# Patient Record
Sex: Female | Born: 1940 | Race: White | Hispanic: No | Smoking: Former smoker
Health system: Southern US, Community
[De-identification: ages and names within clinical notes are randomized; demographics above are authoritative.]

## PROBLEM LIST (undated history)

## (undated) DIAGNOSIS — E782 Mixed hyperlipidemia: Secondary | ICD-10-CM

## (undated) DIAGNOSIS — Z8679 Personal history of other diseases of the circulatory system: Secondary | ICD-10-CM

## (undated) DIAGNOSIS — F329 Major depressive disorder, single episode, unspecified: Secondary | ICD-10-CM

## (undated) DIAGNOSIS — I272 Pulmonary hypertension, unspecified: Secondary | ICD-10-CM

## (undated) DIAGNOSIS — I2510 Atherosclerotic heart disease of native coronary artery without angina pectoris: Secondary | ICD-10-CM

## (undated) DIAGNOSIS — I5042 Chronic combined systolic (congestive) and diastolic (congestive) heart failure: Secondary | ICD-10-CM

## (undated) DIAGNOSIS — F32A Depression, unspecified: Secondary | ICD-10-CM

## (undated) DIAGNOSIS — G5711 Meralgia paresthetica, right lower limb: Secondary | ICD-10-CM

## (undated) DIAGNOSIS — G4733 Obstructive sleep apnea (adult) (pediatric): Secondary | ICD-10-CM

## (undated) DIAGNOSIS — K219 Gastro-esophageal reflux disease without esophagitis: Secondary | ICD-10-CM

## (undated) DIAGNOSIS — Z853 Personal history of malignant neoplasm of breast: Secondary | ICD-10-CM

## (undated) DIAGNOSIS — I10 Essential (primary) hypertension: Secondary | ICD-10-CM

## (undated) DIAGNOSIS — I447 Left bundle-branch block, unspecified: Secondary | ICD-10-CM

## (undated) DIAGNOSIS — E039 Hypothyroidism, unspecified: Secondary | ICD-10-CM

## (undated) DIAGNOSIS — K760 Fatty (change of) liver, not elsewhere classified: Secondary | ICD-10-CM

## (undated) DIAGNOSIS — M519 Unspecified thoracic, thoracolumbar and lumbosacral intervertebral disc disorder: Secondary | ICD-10-CM

## (undated) DIAGNOSIS — I4891 Unspecified atrial fibrillation: Secondary | ICD-10-CM

## (undated) HISTORY — DX: Major depressive disorder, single episode, unspecified: F32.9

## (undated) HISTORY — DX: Essential (primary) hypertension: I10

## (undated) HISTORY — DX: Depression, unspecified: F32.A

## (undated) HISTORY — DX: Gastro-esophageal reflux disease without esophagitis: K21.9

## (undated) HISTORY — DX: Left bundle-branch block, unspecified: I44.7

## (undated) HISTORY — DX: Morbid (severe) obesity due to excess calories: E66.01

## (undated) HISTORY — DX: Meralgia paresthetica, right lower limb: G57.11

## (undated) HISTORY — PX: TUBAL LIGATION: SHX77

## (undated) HISTORY — DX: Hypothyroidism, unspecified: E03.9

## (undated) HISTORY — DX: Unspecified atrial fibrillation: I48.91

## (undated) HISTORY — DX: Chronic combined systolic (congestive) and diastolic (congestive) heart failure: I50.42

## (undated) HISTORY — DX: Mixed hyperlipidemia: E78.2

## (undated) HISTORY — DX: Unspecified thoracic, thoracolumbar and lumbosacral intervertebral disc disorder: M51.9

## (undated) HISTORY — DX: Obstructive sleep apnea (adult) (pediatric): G47.33

## (undated) HISTORY — DX: Fatty (change of) liver, not elsewhere classified: K76.0

## (undated) HISTORY — DX: Pulmonary hypertension, unspecified: I27.20

## (undated) HISTORY — DX: Personal history of malignant neoplasm of breast: Z85.3

---

## 1987-11-24 HISTORY — PX: MASTECTOMY, RADICAL: SHX710

## 2002-06-30 ENCOUNTER — Ambulatory Visit (HOSPITAL_COMMUNITY): Admission: RE | Admit: 2002-06-30 | Discharge: 2002-06-30 | Payer: Self-pay

## 2002-06-30 ENCOUNTER — Encounter: Payer: Self-pay | Admitting: Family Medicine

## 2003-03-13 ENCOUNTER — Encounter: Payer: Self-pay | Admitting: Radiology

## 2003-03-13 ENCOUNTER — Encounter: Payer: Self-pay | Admitting: Neurosurgery

## 2003-03-13 ENCOUNTER — Encounter: Admission: RE | Admit: 2003-03-13 | Discharge: 2003-03-13 | Payer: Self-pay | Admitting: Neurosurgery

## 2003-03-28 ENCOUNTER — Encounter: Payer: Self-pay | Admitting: Neurosurgery

## 2003-03-28 ENCOUNTER — Encounter: Admission: RE | Admit: 2003-03-28 | Discharge: 2003-03-28 | Payer: Self-pay | Admitting: Neurosurgery

## 2003-04-12 ENCOUNTER — Encounter: Payer: Self-pay | Admitting: Radiology

## 2003-04-12 ENCOUNTER — Encounter: Admission: RE | Admit: 2003-04-12 | Discharge: 2003-04-12 | Payer: Self-pay | Admitting: Neurosurgery

## 2003-04-12 ENCOUNTER — Encounter: Payer: Self-pay | Admitting: Neurosurgery

## 2003-10-10 ENCOUNTER — Ambulatory Visit (HOSPITAL_COMMUNITY): Admission: RE | Admit: 2003-10-10 | Discharge: 2003-10-10 | Payer: Self-pay | Admitting: Neurosurgery

## 2003-12-25 HISTORY — PX: FEMORAL EXPLORATION: SHX1585

## 2004-01-08 ENCOUNTER — Ambulatory Visit (HOSPITAL_COMMUNITY): Admission: RE | Admit: 2004-01-08 | Discharge: 2004-01-09 | Payer: Self-pay | Admitting: Neurosurgery

## 2005-10-23 HISTORY — PX: KNEE ARTHROSCOPY: SUR90

## 2006-07-24 HISTORY — PX: HEEL SPUR SURGERY: SHX665

## 2007-06-30 ENCOUNTER — Ambulatory Visit: Payer: Self-pay | Admitting: Cardiology

## 2008-04-19 ENCOUNTER — Ambulatory Visit (HOSPITAL_COMMUNITY): Admission: RE | Admit: 2008-04-19 | Discharge: 2008-04-19 | Payer: Self-pay | Admitting: Ophthalmology

## 2008-04-19 HISTORY — PX: CATARACT EXTRACTION, BILATERAL: SHX1313

## 2008-05-14 ENCOUNTER — Ambulatory Visit (HOSPITAL_COMMUNITY): Admission: RE | Admit: 2008-05-14 | Discharge: 2008-05-14 | Payer: Self-pay | Admitting: Ophthalmology

## 2008-05-23 HISTORY — PX: PARS PLANA VITRECTOMY W/ REPAIR OF MACULAR HOLE: SHX2170

## 2010-12-30 ENCOUNTER — Other Ambulatory Visit: Payer: Self-pay | Admitting: Neurosurgery

## 2010-12-30 DIAGNOSIS — M5126 Other intervertebral disc displacement, lumbar region: Secondary | ICD-10-CM

## 2011-01-07 ENCOUNTER — Other Ambulatory Visit: Payer: Self-pay

## 2011-01-07 ENCOUNTER — Ambulatory Visit
Admission: RE | Admit: 2011-01-07 | Discharge: 2011-01-07 | Disposition: A | Discharge status: Home / Self Care | Payer: BC Managed Care – PPO | Source: Ambulatory Visit | Attending: Neurosurgery | Admitting: Neurosurgery

## 2011-01-07 DIAGNOSIS — M5126 Other intervertebral disc displacement, lumbar region: Secondary | ICD-10-CM

## 2011-02-20 DIAGNOSIS — R079 Chest pain, unspecified: Secondary | ICD-10-CM

## 2011-03-02 HISTORY — PX: LAPAROSCOPIC CHOLECYSTECTOMY: SUR755

## 2011-04-07 NOTE — Op Note (Signed)
Leslie Alexander, Leslie Alexander             ACCOUNT NO.:  [id number]   MEDICAL RECORD NO.:  [id number]          PATIENT TYPE:  AMB   LOCATION:  SDS                          FACILITY:  [facility]   PHYSICIAN:  Alford Highland. Rankin, M.D.   DATE OF BIRTH:  March 10, 1941   DATE OF PROCEDURE:  05/14/2008  DATE OF DISCHARGE:  05/14/2008                               OPERATIVE REPORT   PREOPERATIVE DIAGNOSES:  1. Macular hole, right eye - stage III.  2. Age-related macular degeneration.   POSTOPERATIVE DIAGNOSES:  1. Macular hole, right eye - stage III.  2. Age-related macular degeneration.   PROCEDURES:  1. Posterior vitrectomy with membrane peel - internal limiting      membrane - 25 gauge, right eye.  2. Injection of vitreous substitute - 20%.   SURGEON:  Edmon Crape, MD   ANESTHESIA:  Local retrobulbar and monitored anesthesia control.   INDICATIONS FOR PROCEDURE:  The patient is a 70 year old woman who has  significant impairment of activities of daily living on the basis of  profound vision loss in the right and on the basis of stage III macular  hole.  She understands this is an attempt we will allow the closure of  the macular hole.  She understands with the closure of the macular hole  with the visual acuity had a chance to improve.  She understands the  risks of anesthesia including the rare occurrence of death, but also to  the eye including the underlying conditions  as well as surgical repair  including but not limited to hemorrhage, infection, scarring, need for  another surgery, no change in vision, loss of vision, and progressive  disease despite intervention.   DESCRIPTION OF PROCEDURE:  Appropriate signed consent was obtained, the  patient was taken to the operating room.  In the operating room,  appropriate monitors followed by mild sedation.  Xylocaine 2% was  injected 5 mL retrobulbar into the right eye with an additional 5 mL in  the fashion of a modified [employer] exteriorly.   The right periocular  region was sterilely prepped and draped in usual ophthalmic fashion.  Lid speculum was applied.  A 25-gauge trocar was placed in the  inferotemporal quadrant.  Superior trocar was applied.  Core vitrectomy  was then begun.  Anterior hyaloid was removed.  Position-induced  posterior vitreous detachment was created nasal to the optic nerve and  this was elevated anterior to the equator at 360 degrees and the  vitreous base was trimmed.  At this time, fluid-air exchange was  completed.  There was a small meniscus of fluid overlying the posterior  pole.  A dilute solution of 1 mL ICG mixed with 5.5 mL of BSS was then  placed over the macular region.  This was immediately aspirated using a  soft tip extrusion needle.  Thereafter, a fluid air exchange was done.  A 25-gauge forceps was then used to remove the internal limiting  membrane and this was carried out 360 degrees around the edge of the  macular hole.  Excellent mobilization was then confirmed.  At this time,  fluid-air exchange was  completed.  SF6 exchange 20% was completed. Superior trocars were  removed. Infusion removed. Subconjunctival Decadron was applied.  Sterile patch and Fox shield were applied.  The patient was taken to the  PACU in good and stable condition.      Alford Highland Rankin, M.D.  Electronically Signed     GAR/MEDQ  D:  05/14/2008  T:  05/15/2008  Job:  [id number]

## 2011-04-10 NOTE — Op Note (Signed)
NAME:  DILYN, OSORIA                       ACCOUNT NO.:  [id number]   MEDICAL RECORD NO.:  [id number]                   PATIENT TYPE:  OIB   LOCATION:  2899                                 FACILITY:  [facility]   PHYSICIAN:  Payton Doughty, M.D.                   DATE OF BIRTH:  13-Aug-1941   DATE OF PROCEDURE:  01/08/2004  DATE OF DISCHARGE:                                 OPERATIVE REPORT   PREOPERATIVE. DIAGNOSIS:  Meralgia paresthetica.   POSTOPERATIVE DIAGNOSIS:  Meralgia paresthetica.   OPERATION PERFORMED:  Decompression of lateral femoral cutaneous nerve.   SURGEON:  Payton Doughty, M.D.   ASSISTANTBasilia Jumbo.   ANESTHESIA:  General endotracheal.   PREP:  Sterile Betadine prep and scrub with Betadine paint.   INDICATIONS FOR PROCEDURE:  The patient is a 70 year old female with  meralgia paresthetica and a positive Tinel sign just posterior to the  anterior superior iliac spine.   DESCRIPTION OF PROCEDURE:  The patient was taken to the operating room,  smoothly anesthetized, intubated, and placed supine on the operating table.  The stomach, pannus taped back.  Following shave, prep and drape in the  usual sterile fashion the skin was incised in a curvilinear fashion starting  about 4 cm posterior to the anterior superior iliac spine, carried down to  the inguinal crease, following the inguinal crease to about the level of the  anterior superior iliac spine and then down onto the thigh approximately 3  cm.  Through this incision, the fascial plane was rapidly attained and the  lateral margin of the sartorius muscle identified.  Immediately under it was  compressed the lateral femoral cutaneous nerve as it traversed over to  pierce the tensor fascial lata.  This corresponded exactly to where Tinel's  was.  There was partial division of the tendon to allow decompression of the  nerve so that a finger could be readily passed underneath it.  The nerve was  also followed laterally  along where Tinel's was and several small adhesions  to the tensor fascia lata were divided.  In the end the nerve was completely  decompressed.  The wound was irrigated and hemostasis assured.  The dead  space was eliminated with 2-0 Vicryl in interrupted fashion.  The  subcutaneous tissue was reapproximated with 2-0 Vicryl in interrupted  fashion.  The skin was closed with 3-0 nylon in a simple running fashion.  Betadine Telfa dressing was applied and made occlusive with OpSite.  The  patient was then transferred to the recovery room in good condition.                                               Payton Doughty, M.D.    MWR/MEDQ  D:  01/08/2004  T:  01/08/2004  Job:  [id number]

## 2011-04-10 NOTE — Op Note (Signed)
NAME:  Leslie Alexander, Leslie Alexander                       ACCOUNT NO.:  [id number]   MEDICAL RECORD NO.:  [id number]                   PATIENT TYPE:  OIB   LOCATION:  2859                                 FACILITY:  [facility]   PHYSICIAN:  Payton Doughty, M.D.                   DATE OF BIRTH:  04/22/1941   DATE OF PROCEDURE:  10/10/2003  DATE OF DISCHARGE:                                 OPERATIVE REPORT   OPERATION PERFORMED:  Injection of the right lateral femoral cutaneous  nerve.   SURGEON:  Payton Doughty, M.D.   ASSISTANTBasilia Jumbo.   ANESTHESIA:  1% lidocaine.   COMPLICATIONS:  None.   INDICATIONS FOR PROCEDURE:  The patient is a 70 year old woman with right  neuralgia paresthetica.   DESCRIPTION OF PROCEDURE:  This is a 70 year old lady woman with right  neuralgia paresthetica.  She was taken to the operating room and had skin  cleaned with Betadine solution.  Palpation along the anterior superior iliac  spine revealed a positive Tinel's at the site approximately 2 cm anterior to  the ASIS along the course of the inguinal ligament.  At the site of this  positive Tinel, a skin wheal raised with 1% lidocaine.  Working through  this, 9mL of 0.5% Marcaine was injected in the area around the lateral  femoral cutaneous nerve. The patient felt immediate relieve with no sensory  dysesthesias noted in the lateral distribution of lateral femoral cutaneous  nerve.  She was returned to the recovery room in good condition.                                               Payton Doughty, M.D.    MWR/MEDQ  D:  10/10/2003  T:  10/10/2003  Job:  [id number]

## 2011-04-10 NOTE — H&P (Signed)
NAME:  Leslie Alexander, Leslie Alexander                       ACCOUNT NO.:  [id number]   MEDICAL RECORD NO.:  [id number]                   PATIENT TYPE:  OIB   LOCATION:  2899                                 FACILITY:  [facility]   PHYSICIAN:  Payton Doughty, M.D.                   DATE OF BIRTH:  03/06/1941   DATE OF ADMISSION:  01/08/2004  DATE OF DISCHARGE:                                HISTORY & PHYSICAL   ADMITTING DIAGNOSIS:  Meralgia paresthetica.   HISTORY OF PRESENT ILLNESS:  This is a 70 year old right-handed white lady  who has pain in the distribution of the right lateral femoral cutaneous  nerve.  It has been present for some time.  She has been on Neurontin 600 mg  t.i.d., and has had it injected.  The injection provided transient relief,  and the meralgia paresthetica has recurred.  She is now here for  decompression of the lateral femoral cutaneous nerve.   MEDICAL HISTORY:  Remarkable for anxiety and depression.   SURGICAL HISTORY:  None.   She has had a stress test recently which was negative for cardiac disease.  She had a breast cancer in 1989 with subsequent chemo without any  recurrence.   ALLERGIES:  CODEINE.   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   MEDICATIONS:  1. Neurontin 600 t.i.d.  2. Ativan 0.5 mg b.i.d.  3. Allegra 180 mg a day.  4. Synthroid 0.01 mg a day.  5. Lexapro 10 mg a day.  6. Protonix 40 mg a day.  7. Over-the-counter medications.  8. Aspirin a day.   PHYSICAL EXAMINATION:  HEENT:  Within normal limits.  NECK:  She has reasonable range of motion of her neck.  CHEST:  Clear.  CARDIAC:  Regular rate and rhythm without a murmur at this time.  ABDOMEN:  Nontender, somewhat large without hepatosplenomegaly.  EXTREMITIES:  Without clubbing or cyanosis.  Peripheral pulses are good.  GU:  Deferred.  NEUROLOGIC:  She is awake, alert and oriented.  Cranial nerves are intact.  Motor exam shows 5/5 strength throughout the upper  and lower extremities.  She has pain in the distribution of the lateral femoral cutaneous nerve, and  slightly hypalgesic in that distribution.  She has a positive Tinel's over  the anterior superior iliac spine.  Reflexes are 2 at the knees, absent at  the ankles.  Straight leg raise is positive for the meralgia paresthetica.   Spine studies have been negative.   CLINICAL IMPRESSION:  Meralgia paresthetica.  Because the block worked, I  think it is reasonable to explore it over the site of the Tinel's to see if  there is a compressive lesion that can be identified.  The risks and  benefits have been discussed with her and she wishes to proceed.  Payton Doughty, M.D.    MWR/MEDQ  D:  01/08/2004  T:  01/08/2004  Job:  [id number]

## 2011-08-19 LAB — BASIC METABOLIC PANEL
BUN: 14
Chloride: 101
GFR calc non Af Amer: >60
Glucose, Bld: 108 — ABNORMAL HIGH
Potassium: 4.5
Sodium: 135

## 2011-08-19 LAB — HEMOGLOBIN AND HEMATOCRIT, BLOOD: Hemoglobin: 12.6

## 2011-08-20 LAB — CBC
HCT: 37.2
Hemoglobin: 13.2
RBC: 3.99
RDW: 13.1

## 2011-08-20 LAB — BASIC METABOLIC PANEL
CO2: 26
Calcium: 9.5
GFR calc Af Amer: >60
GFR calc non Af Amer: >60
Glucose, Bld: 121 — ABNORMAL HIGH
Potassium: 4.2
Sodium: 137

## 2011-12-29 HISTORY — PX: TOTAL KNEE ARTHROPLASTY: SHX125

## 2011-12-30 DIAGNOSIS — I4891 Unspecified atrial fibrillation: Secondary | ICD-10-CM

## 2012-01-26 ENCOUNTER — Other Ambulatory Visit: Payer: Self-pay

## 2012-01-26 HISTORY — PX: INCISION AND DRAINAGE OF WOUND: SHX1803

## 2012-03-09 ENCOUNTER — Other Ambulatory Visit: Payer: Self-pay | Admitting: Orthopedic Surgery

## 2012-03-14 ENCOUNTER — Encounter: Payer: Self-pay | Admitting: *Deleted

## 2012-03-18 DIAGNOSIS — I509 Heart failure, unspecified: Secondary | ICD-10-CM

## 2012-03-18 DIAGNOSIS — R0602 Shortness of breath: Secondary | ICD-10-CM

## 2012-03-31 ENCOUNTER — Encounter: Payer: Self-pay | Admitting: Cardiology

## 2012-04-01 ENCOUNTER — Telehealth: Payer: Self-pay | Admitting: Physician Assistant

## 2012-04-01 ENCOUNTER — Encounter: Payer: Self-pay | Admitting: Physician Assistant

## 2012-04-01 ENCOUNTER — Ambulatory Visit (INDEPENDENT_AMBULATORY_CARE_PROVIDER_SITE_OTHER): Payer: Medicare Other | Admitting: Physician Assistant

## 2012-04-01 ENCOUNTER — Encounter: Payer: Self-pay | Admitting: *Deleted

## 2012-04-01 VITALS — BP 108/70 | HR 74 | Ht 65.0 in | Wt 255.0 lb

## 2012-04-01 DIAGNOSIS — R0602 Shortness of breath: Secondary | ICD-10-CM

## 2012-04-01 DIAGNOSIS — Z79899 Other long term (current) drug therapy: Secondary | ICD-10-CM

## 2012-04-01 DIAGNOSIS — I480 Paroxysmal atrial fibrillation: Secondary | ICD-10-CM | POA: Insufficient documentation

## 2012-04-01 DIAGNOSIS — E782 Mixed hyperlipidemia: Secondary | ICD-10-CM | POA: Insufficient documentation

## 2012-04-01 DIAGNOSIS — I5032 Chronic diastolic (congestive) heart failure: Secondary | ICD-10-CM | POA: Insufficient documentation

## 2012-04-01 DIAGNOSIS — I5030 Unspecified diastolic (congestive) heart failure: Secondary | ICD-10-CM

## 2012-04-01 DIAGNOSIS — I447 Left bundle-branch block, unspecified: Secondary | ICD-10-CM | POA: Insufficient documentation

## 2012-04-01 DIAGNOSIS — I4891 Unspecified atrial fibrillation: Secondary | ICD-10-CM

## 2012-04-01 MED ORDER — FUROSEMIDE 40 MG PO TABS: 40.0000 mg | ORAL_TABLET | Freq: Every day | ORAL | Status: DC

## 2012-04-01 MED ORDER — POTASSIUM CHLORIDE CRYS ER 20 MEQ PO TBCR: 20.0000 meq | EXTENDED_RELEASE_TABLET | Freq: Every day | ORAL | Status: DC

## 2012-04-01 NOTE — Progress Notes (Addendum)
HPI: Patient presents as a recent post hospital followup from [facility], seen in consultation by Dr. Andee Lineman and myself, and new to our practice. She presented with no known history of CAD or CHF, with recent diagnosis of PAF, placed on Xarelto by Dr. Reuel Boom. She had had recent diagnostic studies, both reviewed by Dr. Diona Browner, notable for a 2-D echo in February revealing EF 60-65%, mild MR, and mild PHTN. A Lexiscan Myoview in March was negative for definite ischemia; EF 56%.  Patient presented to the hospital with acute/chronic DHF and AF with RVR. NL serial cardiac markers. We recommended continuing beta blocker/digoxin for rate control, and current IV Lasix diuretic regimen, given that the patient had diuresed over 4 L/24 hours. We advised against adding ACE inhibitor, secondary to relative hypotension.  A 2-D echo was repeated, reviewed by Dr. Andee Lineman, revealing EF 55-60%, with diastolic dysfunction, NL RVF, no significant MR, and mild/moderate TR with moderate PHTN (RVSP 55-60 mm mercury).  Since her recent hospitalizationt has been doing very well and, in fact, reports an approximate 40 pound weight loss, since undergoing left TKR earlier this year. Her breathing is much improved, and she denies any PND, orthopnea, DOE, CP, or taking palpitations.  EKG office today, reviewed by me, indicates persistent AF with CVR.  As noted earlier, patient was placed on Xarelto, per Dr. Reuel Boom, at some point following her TKR in February.    Allergies  Allergen Reactions  . Codeine Palpitations  . Ace Inhibitors Cough  . Citrus Rash    Current Outpatient Prescriptions  Medication Sig Dispense Refill  . beta carotene w/minerals (OCUVITE) tablet Take 1 tablet by mouth daily.      Blossom Hoops Iron (PERFECT IRON) 25 MG TABS Take 1 tablet by mouth daily.      . ciprofloxacin (CIPRO) 750 MG tablet Take 1 tablet by mouth Twice daily.      . citalopram (CELEXA) 40 MG tablet Take 40 mg by mouth daily.       .  digoxin (LANOXIN) 0.25 MG tablet Take 250 mcg by mouth daily.      Marland Kitchen docusate sodium (COLACE) 100 MG capsule Take 100 mg by mouth every other day.      . fexofenadine (ALLEGRA) 180 MG tablet Take 180 mg by mouth daily.      Marland Kitchen gabapentin (NEURONTIN) 300 MG capsule Take 300 mg by mouth 3 (three) times daily.      . hydrOXYzine (VISTARIL) 25 MG capsule Take 1 capsule by mouth Every 6 hours.      Marland Kitchen levothyroxine (SYNTHROID, LEVOTHROID) 100 MCG tablet Take 100 mcg by mouth daily.      . metoprolol succinate (TOPROL-XL) 25 MG 24 hr tablet Take 25 mg by mouth daily.      . Misc Natural Products (OSTEO BI-FLEX ADV DOUBLE ST) TABS Take 1 tablet by mouth daily.      . Multiple Vitamin (MULTIVITAMIN) tablet Take 1 tablet by mouth daily.      Marland Kitchen omeprazole (PRILOSEC) 20 MG capsule Take 20 mg by mouth daily.      Marland Kitchen oxyCODONE-acetaminophen (PERCOCET) 5-325 MG per tablet Take 1 tablet by mouth Every 8 hours as needed.      . Red Yeast Rice 600 MG CAPS Take 1 capsule by mouth daily.       . rifampin (RIFADIN) 300 MG capsule Take 1 capsule by mouth Twice daily.      . Rivaroxaban (XARELTO) 20 MG TABS Take 1 tablet by mouth  daily.      . vitamin B-12 (CYANOCOBALAMIN) 100 MCG tablet Take 100 mcg by mouth daily.      . furosemide (LASIX) 40 MG tablet Take 1 tablet (40 mg total) by mouth daily.  30 tablet  6  . potassium chloride SA (KLOR-CON M20) 20 MEQ tablet Take 1 tablet (20 mEq total) by mouth daily.  30 tablet  6    Past Medical History  Diagnosis Date  . Atrial fibrillation   . Essential hypertension, benign   . Mixed hyperlipidemia   . Hypothyroidism   . Depression   . OSA (obstructive sleep apnea)   . Morbid obesity   . Type 2 diabetes mellitus   . Lumbar disc disease   . GERD (gastroesophageal reflux disease)   . Nonalcoholic fatty liver disease   . History of breast cancer   . Meralgia paresthetica, right   . Left bundle branch block     negative Lexiscan Myoview; EF 56%, 3/13   . Diastolic  heart failure     LVEF 55-60%  . Pulmonary hypertension     RVSP 55-60 mm mercury    Past Surgical History  Procedure Date  . Mastectomy, radical 1989    With chemotherapy  . Tubal ligation     BILATERAL  . Laparoscopic cholecystectomy 03/02/2011    Dr. Gabriel Cirri  . Cataract extraction, bilateral 04/19/2008    Dr. Alto Denver  . Femoral exploration 12/2003    RIGHT LATERAL FEMORAL CUTANEOUS NERVE/Dr. Channing Mutters  . Knee arthroscopy 10/2005    Left knee/from torn cartilage Dr. Edger House  . Heel spur surgery 07/2006    For spurs/Dr. Ulice Brilliant  . Pars plana vitrectomy w/ repair of macular hole 05/2008    Dr. Luciana Axe  . Total knee arthroplasty 12/29/2011    Dr. Chaney Malling  . Incision and drainage of wound 01/26/2012    Dr. Chaney Malling    History   Social History  . Marital Status: Married    Spouse Name: N/A    Number of Children: N/A  . Years of Education: N/A   Occupational History  . Not on file.   Social History Main Topics  . Smoking status: Former Smoker -- 0.5 packs/day for 10 years    Types: Cigarettes    Quit date: 11/24/1987  . Smokeless tobacco: Never Used  . Alcohol Use: No  . Drug Use: No  . Sexually Active: Not on file   Other Topics Concern  . Not on file   Social History Narrative   Has 1 daughter   Social History Narrative   Has 1 daughter    Problem Relation Age of Onset  . Other Father     Ulcer disease  . COPD Mother   . Breast cancer Sister   . Breast cancer Sister     ROS: no nausea, vomiting; no fever, chills; no melena, hematochezia; no claudication  PHYSICAL EXAM: BP 108/70  Pulse 74  Ht 5\' 5"  (1.651 m)  Wt 255 lb (115.667 kg)  BMI 42.43 kg/m2  SpO2 97% GENERAL: 71 year-old female, obese; NAD HEENT: NCAT, PERRLA, EOMI; sclera clear; no xanthelasma NECK: palpable bilateral carotid pulses, no bruits; no JVD; no TM LUNGS: CTA bilaterally CARDIAC: Irregularly irregular (S1, S2); no significant murmurs; no rubs or gallops ABDOMEN:  Protuberant EXTREMETIES: 1-2+ bilateral, nonpitting peripheral edema SKIN: warm/dry; no obvious rash/lesions MUSCULOSKELETAL: no joint deformity NEURO: no focal deficit; NL affect   EKG: reviewed and available in Electronic Records   ASSESSMENT &  PLAN:  Atrial fibrillation Plan is to arrange initial attempt at DC cardioversion, for restoration of NSR. Patient is in agreement with this plan. Of note, we also discussed possible RF ablation in the future, as a treatment option. However, the patient fully appreciates the complexity of this procedure, and the associated risks. At this point in time, she is quite willing for at least one attempt at restoration of NSR, via cardioversion. We will range to have this performed sometime next week, with Dr. Andee Lineman. He has instructed her to hold her digoxin for 3 consecutive days, including a.m. of procedure. No other medication adjustments currently recommended. As noted earlier, patient has been maintained on full dose Xarelto, sometime following her TKR in February.  Diastolic heart failure Patient appears euvolemic by history and PE. She reports marked volume loss over the last several weeks. Will check followup BMET/BNP level. Recommended continuing current dose of Lasix 40 mg daily.  Mixed hyperlipidemia Followed by primary MD  Left bundle branch block No further cardiac workup. Ruled out for MI during recent hospitalization. Denies any history of exertional CP. A Lexiscan Myoview, 3/13, was negative for ischemia.   Gene Kemaya Dorner, PAC  Patient seen and examined with Gene Kennisha Qin, PA-C.  Counseling was provided regarding the current medical condition and included: . Diagnosis, impressions, prognosis, recommended diagnostic studies  . Risks and benefits of treatment options  . Instructions for management, treatment and/or follow-up care  . Importance of compliance with treatment, risk factor reduction  . Patient and/or family education    Time  spent counseling was 15 minutes and recorded in the Problem List documeted by Gene Hadleigh Felber , PA-C   Alvin Critchley [facility] 04/04/2012 12:54 PM

## 2012-04-01 NOTE — Patient Instructions (Signed)
Your physician has recommended that you have a Cardioversion (DCCV). Electrical Cardioversion uses a jolt of electricity to your heart either through paddles or wired patches attached to your chest. This is a controlled, usually prescheduled, procedure. Defibrillation is done under light anesthesia in the hospital, and you usually go home the day of the procedure. This is done to get your heart back into a normal rhythm. You are not awake for the procedure. Please see the instruction sheet given to you today. Start Lasix (furosemide) 40 mg and Potassium 20 mEq daily.

## 2012-04-01 NOTE — Assessment & Plan Note (Signed)
Plan is to arrange initial attempt at DC cardioversion, for restoration of NSR. Patient is in agreement with this plan. Of note, we also discussed possible RF ablation in the future, as a treatment option. However, the patient fully appreciates the complexity of this procedure, and the associated risks. At this point in time, she is quite willing for at least one attempt at restoration of NSR, via cardioversion. We will range to have this performed sometime next week, with Dr. Andee Lineman. He has instructed her to hold her digoxin for 3 consecutive days, including a.m. of procedure. No other medication adjustments currently recommended. As noted earlier, patient has been maintained on full dose Xarelto, sometime following her TKR in February.

## 2012-04-01 NOTE — Telephone Encounter (Signed)
comments: Pre-cert DCCV 4/54/09 Dr. Earnestine Leys at [facility]

## 2012-04-01 NOTE — Telephone Encounter (Signed)
Pt has Medicare and BCBS.  No precert required.

## 2012-04-01 NOTE — Assessment & Plan Note (Signed)
Followed by primary M.D. 

## 2012-04-01 NOTE — Assessment & Plan Note (Addendum)
No further cardiac workup. Ruled out for MI during recent hospitalization. Denies any history of exertional CP. A Lexiscan Myoview, 3/13, was negative for ischemia.

## 2012-04-01 NOTE — Assessment & Plan Note (Signed)
Patient appears euvolemic by history and PE. She reports marked volume loss over the last several weeks. Will check followup BMET/BNP level. Recommended continuing current dose of Lasix 40 mg daily.

## 2012-04-05 DIAGNOSIS — I4891 Unspecified atrial fibrillation: Secondary | ICD-10-CM

## 2012-04-06 ENCOUNTER — Telehealth: Payer: Self-pay | Admitting: *Deleted

## 2012-04-06 NOTE — Telephone Encounter (Addendum)
Message left on voicemail - concerned about weight gain, 3.4 lbs in one day.    Cardioversion on 5/14.    Left message to return call.

## 2012-04-08 NOTE — Telephone Encounter (Signed)
Left message to return call 

## 2012-04-13 NOTE — Telephone Encounter (Signed)
Patient states her sister had recently passed away & had lots of things going on.  States she is fine & everything back to normal now.

## 2012-05-06 ENCOUNTER — Telehealth: Payer: Self-pay | Admitting: *Deleted

## 2012-05-06 NOTE — Telephone Encounter (Signed)
Patient informed. 

## 2012-05-06 NOTE — Telephone Encounter (Signed)
Message copied by Eustace Moore on Fri May 06, 2012 10:11 AM ------      Message from: Rande Brunt      Created: Wed Apr 06, 2012 10:18 AM       NL renal fxn, BNP 300. Clinically improved at recent OV. Will reassess clinical status, after DCCV for AF.

## 2012-05-10 ENCOUNTER — Other Ambulatory Visit: Payer: Self-pay | Admitting: Family Medicine

## 2012-05-10 DIAGNOSIS — R921 Mammographic calcification found on diagnostic imaging of breast: Secondary | ICD-10-CM

## 2012-05-13 ENCOUNTER — Ambulatory Visit
Admission: RE | Admit: 2012-05-13 | Discharge: 2012-05-13 | Disposition: A | Discharge status: Home / Self Care | Payer: Medicare Other | Source: Ambulatory Visit | Attending: Family Medicine | Admitting: Family Medicine

## 2012-05-13 ENCOUNTER — Other Ambulatory Visit: Payer: Self-pay | Admitting: Diagnostic Radiology

## 2012-05-13 ENCOUNTER — Other Ambulatory Visit: Payer: Self-pay | Admitting: Family Medicine

## 2012-05-13 DIAGNOSIS — R921 Mammographic calcification found on diagnostic imaging of breast: Secondary | ICD-10-CM

## 2012-05-25 ENCOUNTER — Ambulatory Visit (INDEPENDENT_AMBULATORY_CARE_PROVIDER_SITE_OTHER): Payer: Medicare Other | Admitting: Cardiology

## 2012-05-25 ENCOUNTER — Encounter: Payer: Self-pay | Admitting: Cardiology

## 2012-05-25 VITALS — BP 110/68 | HR 65 | Ht 65.5 in | Wt 257.8 lb

## 2012-05-25 DIAGNOSIS — I4891 Unspecified atrial fibrillation: Secondary | ICD-10-CM

## 2012-05-25 DIAGNOSIS — E782 Mixed hyperlipidemia: Secondary | ICD-10-CM

## 2012-05-25 DIAGNOSIS — I447 Left bundle-branch block, unspecified: Secondary | ICD-10-CM

## 2012-05-25 DIAGNOSIS — T451X5A Adverse effect of antineoplastic and immunosuppressive drugs, initial encounter: Secondary | ICD-10-CM

## 2012-05-25 DIAGNOSIS — I480 Paroxysmal atrial fibrillation: Secondary | ICD-10-CM

## 2012-05-25 NOTE — Assessment & Plan Note (Signed)
Patient remains in normal sinus rhythm. No recurrent palpitations.CHADS2Vasc score is 4. I told the patient in my opinion she should stay on Xarelto. He is at significant increased risk for stroke in the future roller on 6% per year. Although her chads 2 score is only 2 risk stratification is improved with former scoring system. She can discuss this with Dr. Reuel Boom further bleeding in my opinion the patient should remain on Xarelto unless she has any complications.

## 2012-05-25 NOTE — Assessment & Plan Note (Signed)
Chronic no changes. Left bundle branch block

## 2012-05-25 NOTE — Patient Instructions (Addendum)
Your physician recommends that you schedule a follow-up appointment in: 6 months with Dr. Degent. You will receive a reminder letter in the mail in about 4 months reminding you to call and schedule your appointment. If you don't receive this letter, please contact our office.   Your physician recommends that you continue on your current medications as directed. Please refer to the Current Medication list given to you today.  

## 2012-05-25 NOTE — Progress Notes (Signed)
Peyton Bottoms, MD, [facility]IM Board Certified in Adult Cardiovascular Medicine,Internal Medicine and Critical Care Medicine    CC:        followup patient with an episode of diastolic heart failure and atrial fibrillation.                                                                           HPI:     Patient is doing well. She reports no chest pain shortness of breath orthopnea or PND. She is able to perform her ADLs. 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 She does a lot of work around the house and is preparing sweet [employer] this week. The patient is otherwise stable from a cardiovascular perspective. She reports no palpitations, presyncope or syncope.  PMH: reviewed and listed in Problem List in Electronic Records (and see below) Past Medical History  Diagnosis Date  . Atrial fibrillation   . Essential hypertension, benign   . Mixed hyperlipidemia   . Hypothyroidism   . Depression   . OSA (obstructive sleep apnea)   . Morbid obesity   . Type 2 diabetes mellitus   . Lumbar disc disease   . GERD (gastroesophageal reflux disease)   . Nonalcoholic fatty liver disease   . History of breast cancer   . Meralgia paresthetica, right   . Left bundle branch block     negative Lexiscan Myoview; EF 56%, 3/13   . Diastolic heart failure     LVEF 55-60%  . Pulmonary hypertension     RVSP 55-60 mm mercury   Past Surgical History  Procedure Date  . Mastectomy, radical 1989    With chemotherapy  . Tubal ligation     BILATERAL  . Laparoscopic cholecystectomy 03/02/2011    Dr. Gabriel Cirri  . Cataract extraction, bilateral 04/19/2008    Dr. Alto Denver  . Femoral exploration 12/2003    RIGHT LATERAL FEMORAL CUTANEOUS NERVE/Dr. Channing Mutters  . Knee arthroscopy 10/2005    Left knee/from torn cartilage Dr. Edger House  . Heel spur surgery 07/2006    For spurs/Dr. Ulice Brilliant  . Pars plana vitrectomy w/ repair of  macular hole 05/2008    Dr. Luciana Axe  . Total knee arthroplasty 12/29/2011    Dr. Chaney Malling  . Incision and drainage of wound 01/26/2012    Dr. Chaney Malling    Allergies/SH/FHX : available in Electronic Records for review  Allergies  Allergen Reactions  . Codeine Palpitations  . Ace Inhibitors Cough  . Citrus Rash   History   Social History  . Marital Status: Married    Spouse Name: N/A    Number of Children: N/A  . Years of Education: N/A   Occupational History  . Not on file.   Social History Main Topics  . Smoking status: Former Smoker -- 0.5 packs/day for 10 years    Types: Cigarettes    Quit date: 11/24/1987  . Smokeless tobacco: Never Used  . Alcohol Use: No  . Drug Use: No  . Sexually Active: Not on file   Other Topics Concern  . Not on file   Social History Narrative   Has 1  daughter   Family History  Problem Relation Age of Onset  . Other Father     Ulcer disease  . COPD Mother   . Breast cancer Sister   . Breast cancer Sister     Medications: Current Outpatient Prescriptions  Medication Sig Dispense Refill  . ALPRAZolam (XANAX) 0.5 MG tablet Take 0.25 mg by mouth 2 (two) times daily.       . beta carotene w/minerals (OCUVITE) tablet Take 1 tablet by mouth daily.      . cetirizine (ZYRTEC) 10 MG tablet Take 10 mg by mouth at bedtime.      . ciprofloxacin (CIPRO) 750 MG tablet Take 1 tablet by mouth Twice daily.      . citalopram (CELEXA) 40 MG tablet Take 20 mg by mouth 2 (two) times daily.       Marland Kitchen docusate sodium (COLACE) 100 MG capsule Take 100 mg by mouth every other day.      . furosemide (LASIX) 40 MG tablet Take 1 tablet (40 mg total) by mouth daily.  30 tablet  6  . gabapentin (NEURONTIN) 300 MG capsule Take 300 mg by mouth 3 (three) times daily.      . hydrOXYzine (VISTARIL) 25 MG capsule Take 1 capsule by mouth Every 6 hours.      Marland Kitchen levothyroxine (SYNTHROID, LEVOTHROID) 100 MCG tablet Take 100 mcg by mouth daily.      . metoprolol succinate  (TOPROL-XL) 25 MG 24 hr tablet Take 25 mg by mouth daily.      . potassium chloride SA (KLOR-CON M20) 20 MEQ tablet Take 1 tablet (20 mEq total) by mouth daily.  30 tablet  6  . rifampin (RIFADIN) 300 MG capsule Take 1 capsule by mouth Twice daily.      . Rivaroxaban (XARELTO) 20 MG TABS Take 1 tablet by mouth daily.      Marland Kitchen zolpidem (AMBIEN) 10 MG tablet Take 5 mg by mouth at bedtime.         ROS: No nausea or vomiting. No fever or chills.No melena or hematochezia.No bleeding.No claudication  Physical Exam: BP 110/68  Pulse 65  Ht 5' 5.5" (1.664 m)  Wt 257 lb 12.8 oz (116.937 kg)  BMI 42.25 kg/m2 General: Well-nourished white female in no distress Neck: Normal carotid upstroke no carotid bruits. No thyromegaly nonnodular thyroid. JVP 6 cm Lungs: Clear breath sounds bilaterally no wheezing Cardiac: Regular rate and rhythm with normal S1-paradoxically split second heart sound. No pathological murmurs Vascular: No edema. Normal distal pulses Skin: Warm and dry Physcologic: Normal affect  12lead ECG: Normal sinus rhythm with left bundle branch block Limited bedside ECHO:N/A No images are attached to the encounter.   I reviewed and summarized the old records. I reviewed ECG and prior blood work.  Assessment and Plan  Diastolic heart failure Patient has no recurrent heart failure symptoms. She is euvolemic. No change in diuretic regimen.  PAF (paroxysmal atrial fibrillation) Patient remains in normal sinus rhythm. No recurrent palpitations.CHADS2Vasc score is 4. I told the patient in my opinion she should stay on Xarelto. He is at significant increased risk for stroke in the future roller on 6% per year. Although her chads 2 score is only 2 risk stratification is improved with former scoring system. She can discuss this with Dr. Reuel Boom further bleeding in my opinion the patient should remain on Xarelto unless she has any complications.  Left bundle branch block Chronic no changes.  Left bundle branch block  Mixed  hyperlipidemia Patient has no coronary artery disease. No clear indication for primary prevention with statins. Patient can followup with her primary care physician regarding this issue.    Patient Active Problem List  Diagnosis  . PAF (paroxysmal atrial fibrillation)  . Diastolic heart failure  . Left bundle branch block  . Mixed hyperlipidemia

## 2012-05-25 NOTE — Assessment & Plan Note (Signed)
Patient has no coronary artery disease. No clear indication for primary prevention with statins. Patient can followup with her primary care physician regarding this issue.

## 2012-05-25 NOTE — Assessment & Plan Note (Signed)
Patient has no recurrent heart failure symptoms. She is euvolemic. No change in diuretic regimen.

## 2012-08-17 DIAGNOSIS — I4891 Unspecified atrial fibrillation: Secondary | ICD-10-CM

## 2013-12-04 ENCOUNTER — Encounter (HOSPITAL_COMMUNITY): Payer: Self-pay | Admitting: Pharmacy Technician

## 2013-12-14 NOTE — Patient Instructions (Signed)
Your procedure is scheduled on:  12/18/13  Report to [facility] at 10:30 AM.  Call this number if you have problems the morning of surgery: [phone]   Remember:   Do not eat or drink: After Midnight.  Take these medicines the morning of surgery with A SIP OF WATER: Citalopram, Levothyroxine, Metoprolol, Omeprazole, Gabapentin, Tramadol and Xanax.   Do not wear jewelry, make-up or nail polish.  Do not wear lotions, powders, or perfumes. You may wear deodorant.  Do not shave 48 hours prior to surgery. Men may shave face and neck.  Do not bring valuables to the hospital.  Contacts, dentures or bridgework may not be worn into surgery.  Leave suitcase in the car. After surgery it may be brought to your room.  For patients admitted to the hospital, checkout time is 11:00 AM the day of discharge.   Patients discharged the day of surgery will not be allowed to drive home.    Special Instructions: Start using your eye drops before surgery as directed by your eye doctor.   Please read over the following fact sheets that you were given: Anesthesia Post-op Instructions    Cataract Surgery  A cataract is a clouding of the lens of the eye. When a lens becomes cloudy, vision is reduced based on the degree and nature of the clouding. Surgery may be needed to improve vision. Surgery removes the cloudy lens and usually replaces it with a substitute lens (intraocular lens, IOL). LET YOUR EYE DOCTOR KNOW ABOUT:  Allergies to food or medicine.  Medicines taken including herbs, eyedrops, over-the-counter medicines, and creams.  Use of steroids (by mouth or creams).  Previous problems with anesthetics or numbing medicine.  History of bleeding problems or blood clots.  Previous surgery.  Other health problems, including diabetes and kidney problems.  Possibility of pregnancy, if this applies. RISKS AND COMPLICATIONS  Infection.  Inflammation of the eyeball (endophthalmitis) that can spread to  both eyes (sympathetic ophthalmia).  Poor wound healing.  If an IOL is inserted, it can later fall out of proper position. This is very uncommon.  Clouding of the part of your eye that holds an IOL in place. This is called an "after-cataract." These are uncommon, but easily treated. BEFORE THE PROCEDURE  Do not eat or drink anything except small amounts of water for 8 to 12 before your surgery, or as directed by your caregiver.  Unless you are told otherwise, continue any eyedrops you have been prescribed.  Talk to your primary caregiver about all other medicines that you take (both prescription and non-prescription). In some cases, you may need to stop or change medicines near the time of your surgery. This is most important if you are taking blood-thinning medicine.Do not stop medicines unless you are told to do so.  Arrange for someone to drive you to and from the procedure.  Do not put contact lenses in either eye on the day of your surgery. PROCEDURE There is more than one method for safely removing a cataract. Your doctor can explain the differences and help determine which is best for you. Phacoemulsification surgery is the most common form of cataract surgery.  An injection is given behind the eye or eyedrops are given to make this a painless procedure.  A small cut (incision) is made on the edge of the clear, dome-shaped surface that covers the front of the eye (cornea).  A tiny probe is painlessly inserted into the eye. This device gives off ultrasound  waves that soften and break up the cloudy center of the lens. This makes it easier for the cloudy lens to be removed by suction.  An IOL may be implanted.  The normal lens of the eye is covered by a clear capsule. Part of that capsule is intentionally left in the eye to support the IOL.  Your surgeon may or may not use stitches to close the incision. There are other forms of cataract surgery that require a larger incision  and stiches to close the eye. This approach is taken in cases where the doctor feels that the cataract cannot be easily removed using phacoemulsification. AFTER THE PROCEDURE  When an IOL is implanted, it does not need care. It becomes a permanent part of your eye and cannot be seen or felt.  Your doctor will schedule follow-up exams to check on your progress.  Review your other medicines with your doctor to see which can be resumed after surgery.  Use eyedrops or take medicine as prescribed by your doctor. Document Released: 10/29/2011 Document Revised: 02/01/2012 Document Reviewed: 10/29/2011 [facility] Patient Information 2013 [place], [place].    PATIENT INSTRUCTIONS POST-ANESTHESIA  IMMEDIATELY FOLLOWING SURGERY:  Do not drive or operate machinery for the first twenty four hours after surgery.  Do not make any important decisions for twenty four hours after surgery or while taking narcotic pain medications or sedatives.  If you develop intractable nausea and vomiting or a severe headache please notify your doctor immediately.  FOLLOW-UP:  Please make an appointment with your surgeon as instructed. You do not need to follow up with anesthesia unless specifically instructed to do so.  WOUND CARE INSTRUCTIONS (if applicable):  Keep a dry clean dressing on the anesthesia/puncture wound site if there is drainage.  Once the wound has quit draining you may leave it open to air.  Generally you should leave the bandage intact for twenty four hours unless there is drainage.  If the epidural site drains for more than 36-48 hours please call the anesthesia department.  QUESTIONS?:  Please feel free to call your physician or the hospital operator if you have any questions, and they will be happy to assist you.

## 2013-12-15 ENCOUNTER — Encounter (HOSPITAL_COMMUNITY)
Admission: RE | Admit: 2013-12-15 | Discharge: 2013-12-15 | Disposition: A | Discharge status: Home / Self Care | Payer: Medicare PPO | Source: Ambulatory Visit | Attending: Ophthalmology | Admitting: Ophthalmology

## 2013-12-15 ENCOUNTER — Other Ambulatory Visit: Payer: Self-pay

## 2013-12-15 ENCOUNTER — Encounter (HOSPITAL_COMMUNITY): Payer: Self-pay

## 2013-12-15 HISTORY — DX: Personal history of other diseases of the circulatory system: Z86.79

## 2013-12-15 LAB — BASIC METABOLIC PANEL
BUN: 30 mg/dL — ABNORMAL HIGH (ref 6–23)
CALCIUM: 9.4 mg/dL (ref 8.4–10.5)
CO2: 26 mEq/L (ref 19–32)
CREATININE: 1.2 mg/dL — AB (ref 0.50–1.10)
Chloride: 94 mEq/L — ABNORMAL LOW (ref 96–112)
GFR calc non Af Amer: 45 mL/min — ABNORMAL LOW (ref >90)
GFR, EST AFRICAN AMERICAN: 52 mL/min — AB (ref >90)
Glucose, Bld: 124 mg/dL — ABNORMAL HIGH (ref 70–99)
Potassium: 4.2 mEq/L (ref 3.7–5.3)
Sodium: 134 mEq/L — ABNORMAL LOW (ref 137–147)

## 2013-12-15 LAB — HEMOGLOBIN AND HEMATOCRIT, BLOOD
HEMATOCRIT: 37.6 % (ref 36.0–46.0)
HEMOGLOBIN: 12.9 g/dL (ref 12.0–15.0)

## 2013-12-15 MED ORDER — PHENYLEPHRINE HCL 2.5 % OP SOLN
OPHTHALMIC | Status: AC
  Filled 2013-12-15: qty 15

## 2013-12-15 MED ORDER — LIDOCAINE HCL 3.5 % OP GEL
OPHTHALMIC | Status: AC
  Filled 2013-12-15: qty 1

## 2013-12-15 MED ORDER — CYCLOPENTOLATE-PHENYLEPHRINE OP SOLN OPTIME - NO CHARGE
OPHTHALMIC | Status: AC
  Filled 2013-12-15: qty 2

## 2013-12-15 MED ORDER — TETRACAINE HCL 0.5 % OP SOLN
OPHTHALMIC | Status: AC
  Filled 2013-12-15: qty 2

## 2013-12-15 MED ORDER — LIDOCAINE HCL (PF) 1 % IJ SOLN
INTRAMUSCULAR | Status: AC
  Filled 2013-12-15: qty 2

## 2013-12-15 MED ORDER — NEOMYCIN-POLYMYXIN-DEXAMETH 3.5-10000-0.1 OP SUSP
OPHTHALMIC | Status: AC
  Filled 2013-12-15: qty 5

## 2013-12-18 ENCOUNTER — Ambulatory Visit (HOSPITAL_COMMUNITY)
Admission: RE | Admit: 2013-12-18 | Discharge: 2013-12-18 | Disposition: A | Discharge status: Home / Self Care | Payer: Medicare PPO | Source: Ambulatory Visit | Attending: Ophthalmology | Admitting: Ophthalmology

## 2013-12-18 ENCOUNTER — Encounter (HOSPITAL_COMMUNITY): Payer: Self-pay | Admitting: Emergency Medicine

## 2013-12-18 ENCOUNTER — Encounter (HOSPITAL_COMMUNITY): Payer: Medicare PPO | Admitting: Anesthesiology

## 2013-12-18 ENCOUNTER — Encounter (HOSPITAL_COMMUNITY)
Admission: RE | Disposition: A | Discharge status: Home / Self Care | Payer: Self-pay | Source: Ambulatory Visit | Attending: Ophthalmology

## 2013-12-18 ENCOUNTER — Ambulatory Visit (HOSPITAL_COMMUNITY): Payer: Medicare PPO | Admitting: Anesthesiology

## 2013-12-18 DIAGNOSIS — Z01812 Encounter for preprocedural laboratory examination: Secondary | ICD-10-CM | POA: Insufficient documentation

## 2013-12-18 DIAGNOSIS — H2589 Other age-related cataract: Secondary | ICD-10-CM | POA: Insufficient documentation

## 2013-12-18 DIAGNOSIS — I10 Essential (primary) hypertension: Secondary | ICD-10-CM | POA: Insufficient documentation

## 2013-12-18 DIAGNOSIS — Z7982 Long term (current) use of aspirin: Secondary | ICD-10-CM | POA: Insufficient documentation

## 2013-12-18 HISTORY — PX: CATARACT EXTRACTION W/PHACO: SHX586

## 2013-12-18 LAB — GLUCOSE, CAPILLARY: Glucose-Capillary: 135 mg/dL — ABNORMAL HIGH (ref 70–99)

## 2013-12-18 SURGERY — PHACOEMULSIFICATION, CATARACT, WITH IOL INSERTION
Anesthesia: Monitor Anesthesia Care | Site: Eye | Laterality: Left

## 2013-12-18 MED ORDER — MIDAZOLAM HCL 2 MG/2ML IJ SOLN
INTRAMUSCULAR | Status: AC
  Filled 2013-12-18: qty 2

## 2013-12-18 MED ORDER — CYCLOPENTOLATE-PHENYLEPHRINE 0.2-1 % OP SOLN
1.0000 [drp] | OPHTHALMIC | Status: AC
  Administered 2013-12-18 (×3): 1 [drp] via OPHTHALMIC

## 2013-12-18 MED ORDER — EPINEPHRINE HCL 1 MG/ML IJ SOLN
INTRAOCULAR | Status: DC | PRN
  Administered 2013-12-18: 11:00:00

## 2013-12-18 MED ORDER — TETRACAINE HCL 0.5 % OP SOLN
1.0000 [drp] | OPHTHALMIC | Status: AC
  Administered 2013-12-18 (×3): 1 [drp] via OPHTHALMIC

## 2013-12-18 MED ORDER — EPINEPHRINE HCL 1 MG/ML IJ SOLN
INTRAMUSCULAR | Status: AC
  Filled 2013-12-18: qty 1

## 2013-12-18 MED ORDER — LIDOCAINE 3.5 % OP GEL OPTIME - NO CHARGE
OPHTHALMIC | Status: DC | PRN
  Administered 2013-12-18: 2 [drp] via OPHTHALMIC

## 2013-12-18 MED ORDER — FENTANYL CITRATE 0.05 MG/ML IJ SOLN
INTRAMUSCULAR | Status: AC
  Filled 2013-12-18: qty 2

## 2013-12-18 MED ORDER — POVIDONE-IODINE 5 % OP SOLN
OPHTHALMIC | Status: DC | PRN
  Administered 2013-12-18: 1 via OPHTHALMIC

## 2013-12-18 MED ORDER — LIDOCAINE HCL 3.5 % OP GEL: 1.0000 "application " | Freq: Once | OPHTHALMIC | Status: DC

## 2013-12-18 MED ORDER — LACTATED RINGERS IV SOLN
INTRAVENOUS | Status: DC
Start: 2013-12-18 — End: 2013-12-18
  Administered 2013-12-18: 11:00:00 via INTRAVENOUS

## 2013-12-18 MED ORDER — MIDAZOLAM HCL 2 MG/2ML IJ SOLN
1.0000 mg | INTRAMUSCULAR | Status: AC | PRN
  Administered 2013-12-18 (×2): 1 mg via INTRAVENOUS
  Administered 2013-12-18: 2 mg via INTRAVENOUS

## 2013-12-18 MED ORDER — PHENYLEPHRINE HCL 2.5 % OP SOLN
1.0000 [drp] | OPHTHALMIC | Status: AC
  Administered 2013-12-18 (×3): 1 [drp] via OPHTHALMIC

## 2013-12-18 MED ORDER — BSS IO SOLN
INTRAOCULAR | Status: DC | PRN
  Administered 2013-12-18: 15 mL via INTRAOCULAR

## 2013-12-18 MED ORDER — PROVISC 10 MG/ML IO SOLN
INTRAOCULAR | Status: DC | PRN
Start: 2013-12-18 — End: 2013-12-18
  Administered 2013-12-18: 0.85 mL via INTRAOCULAR

## 2013-12-18 MED ORDER — FENTANYL CITRATE 0.05 MG/ML IJ SOLN
25.0000 ug | INTRAMUSCULAR | Status: AC
  Administered 2013-12-18: 25 ug via INTRAVENOUS
  Administered 2013-12-18: 11:00:00 via INTRAVENOUS

## 2013-12-18 MED ORDER — LIDOCAINE HCL (PF) 1 % IJ SOLN
INTRAMUSCULAR | Status: DC | PRN
  Administered 2013-12-18: .5 mL

## 2013-12-18 SURGICAL SUPPLY — 33 items
CAPSULAR TENSION RING-AMO (OPHTHALMIC RELATED) IMPLANT
CLOTH BEACON ORANGE TIMEOUT ST (SAFETY) ×3 IMPLANT
EYE SHIELD UNIVERSAL CLEAR (GAUZE/BANDAGES/DRESSINGS) ×3 IMPLANT
GLOVE BIO SURGEON STRL SZ 6.5 (GLOVE) IMPLANT
GLOVE BIO SURGEONS STRL SZ 6.5 (GLOVE)
GLOVE BIOGEL PI IND STRL 6.5 (GLOVE) ×1 IMPLANT
GLOVE BIOGEL PI IND STRL 7.0 (GLOVE) IMPLANT
GLOVE BIOGEL PI IND STRL 7.5 (GLOVE) IMPLANT
GLOVE BIOGEL PI INDICATOR 6.5 (GLOVE) ×2
GLOVE BIOGEL PI INDICATOR 7.0 (GLOVE)
GLOVE BIOGEL PI INDICATOR 7.5 (GLOVE)
GLOVE ECLIPSE 6.5 STRL STRAW (GLOVE) IMPLANT
GLOVE ECLIPSE 7.0 STRL STRAW (GLOVE) IMPLANT
GLOVE ECLIPSE 7.5 STRL STRAW (GLOVE) IMPLANT
GLOVE EXAM NITRILE LRG STRL (GLOVE) ×3 IMPLANT
GLOVE EXAM NITRILE MD LF STRL (GLOVE) IMPLANT
GLOVE SKINSENSE NS SZ6.5 (GLOVE)
GLOVE SKINSENSE NS SZ7.0 (GLOVE)
GLOVE SKINSENSE STRL SZ6.5 (GLOVE) IMPLANT
GLOVE SKINSENSE STRL SZ7.0 (GLOVE) IMPLANT
KIT VITRECTOMY (OPHTHALMIC RELATED) IMPLANT
PAD ARMBOARD 7.5X6 YLW CONV (MISCELLANEOUS) ×3 IMPLANT
PROC W NO LENS (INTRAOCULAR LENS)
PROC W SPEC LENS (INTRAOCULAR LENS)
PROCESS W NO LENS (INTRAOCULAR LENS) IMPLANT
PROCESS W SPEC LENS (INTRAOCULAR LENS) IMPLANT
RING MALYGIN (MISCELLANEOUS) IMPLANT
SIGHTPATH CAT PROC W REG LENS (Ophthalmic Related) ×3 IMPLANT
SYR TB 1ML LL NO SAFETY (SYRINGE) ×3 IMPLANT
TAPE SURG TRANSPORE 1 IN (GAUZE/BANDAGES/DRESSINGS) ×1 IMPLANT
TAPE SURGICAL TRANSPORE 1 IN (GAUZE/BANDAGES/DRESSINGS) ×2
VISCOELASTIC ADDITIONAL (OPHTHALMIC RELATED) IMPLANT
WATER STERILE IRR 250ML POUR (IV SOLUTION) ×3 IMPLANT

## 2013-12-18 NOTE — H&P (Signed)
I have reviewed the H&P, the patient was re-examined, and I have identified no interval changes in medical condition and plan of care since the history and physical of record  

## 2013-12-18 NOTE — Op Note (Signed)
Date of Admission: 12/18/2013  Date of Surgery: 12/18/2013  Pre-Op Dx: Cataract  Left  Eye  Post-Op Dx: Cataract  Left  Eye,  Dx Code 366.19  Surgeon: Gemma Payor, M.D.  Assistants: None  Anesthesia: Topical with MAC  Indications: Painless, progressive loss of vision with compromise of daily activities.  Surgery: Cataract Extraction with Intraocular lens Implant Left Eye  Discription: The patient had dilating drops and viscous lidocaine placed into the left eye in the pre-op holding area. After transfer to the operating room, a time out was performed. The patient was then prepped and draped. Beginning with a 75 degree blade a paracentesis port was made at the surgeon's 2 o'clock position. The anterior chamber was then filled with 1% non-preserved lidocaine. This was followed by filling the anterior chamber with Provisc. A 2.55mm keratome blade was used to make a clear corneal incision at the temporal limbus. A bent cystatome needle was used to create a continuous tear capsulotomy. Hydrodissection was performed with balanced salt solution on a Fine canula. The lens nucleus was then removed using the phacoemulsification handpiece. Residual cortex was removed with the I&A handpiece. The anterior chamber and capsular bag were refilled with Provisc. A posterior chamber intraocular lens was placed into the capsular bag with it's injector. The implant was positioned with the Kuglan hook. The Provisc was then removed from the anterior chamber and capsular bag with the I&A handpiece. Stromal hydration of the main incision and paracentesis port was performed with BSS on a Fine canula. The wounds were tested for leak which was negative. The patient tolerated the procedure well. There were no operative complications. The patient was then transferred to the recovery room in stable condition.  Complications: None  Specimen: None  EBL: None  Prosthetic device: Alcon AcrySof, [id number], power 20.0D, SN  [id number].

## 2013-12-18 NOTE — Anesthesia Preprocedure Evaluation (Signed)
Anesthesia Evaluation  Patient identified by MRN, date of birth, ID band Patient awake    Reviewed: Allergy & Precautions, H&P , NPO status , Patient's Chart, lab work & pertinent test results  Airway Mallampati: II TM Distance: >3 FB Neck ROM: Full    Dental  (+) Teeth Intact   Pulmonary sleep apnea , former smoker,    Pulmonary exam normal       Cardiovascular hypertension, Pt. on medications + dysrhythmias Atrial Fibrillation Rhythm:Regular Rate:Normal     Neuro/Psych PSYCHIATRIC DISORDERS Depression  Neuromuscular disease    GI/Hepatic GERD-  Medicated and Controlled,NASH   Endo/Other  Hypothyroidism Morbid obesity  Renal/GU      Musculoskeletal   Abdominal   Peds  Hematology   Anesthesia Other Findings   Reproductive/Obstetrics                           Anesthesia Physical Anesthesia Plan  ASA: III  Anesthesia Plan: MAC   Post-op Pain Management:    Induction: Intravenous  Airway Management Planned: Nasal Cannula  Additional Equipment:   Intra-op Plan:   Post-operative Plan:   Informed Consent: I have reviewed the patients History and Physical, chart, labs and discussed the procedure including the risks, benefits and alternatives for the proposed anesthesia with the patient or authorized representative who has indicated his/her understanding and acceptance.     Plan Discussed with:   Anesthesia Plan Comments:         Anesthesia Quick Evaluation

## 2013-12-18 NOTE — Discharge Instructions (Signed)
Monitored Anesthesia Care  °Monitored anesthesia care is an anesthesia service for a medical procedure. Anesthesia is the loss of the ability to feel pain. It is produced by medications called anesthetics. It may affect a small area of your body (local anesthesia), a large area of your body (regional anesthesia), or your entire body (general anesthesia). The need for monitored anesthesia care depends your procedure, your condition, and the potential need for regional or general anesthesia. It is often provided during procedures where:  °· General anesthesia may be needed if there are complications. This is because you need special care when you are under general anesthesia.   °· You will be under local or regional anesthesia. This is so that you are able to have higher levels of anesthesia if needed.   °· You will receive calming medications (sedatives). This is especially the case if sedatives are given to put you in a semi-conscious state of relaxation (deep sedation). This is because the amount of sedative needed to produce this state can be hard to predict. Too much of a sedative can produce general anesthesia. °Monitored anesthesia care is performed by one or more caregivers who have special training in all types of anesthesia. You will need to meet with these caregivers before your procedure. During this meeting, they will ask you about your medical history. They will also give you instructions to follow. (For example, you will need to stop eating and drinking before your procedure. You may also need to stop or change medications you are taking.) During your procedure, your caregivers will stay with you. They will:  °· Watch your condition. This includes watching you blood pressure, breathing, and level of pain.   °· Diagnose and treat problems that occur.   °· Give medications if they are needed. These may include calming medications (sedatives) and anesthetics.   °· Make sure you are comfortable.   °Having  monitored anesthesia care does not necessarily mean that you will be under anesthesia. It does mean that your caregivers will be able to manage anesthesia if you need it or if it occurs. It also means that you will be able to have a different type of anesthesia than you are having if you need it. When your procedure is complete, your caregivers will continue to watch your condition. They will make sure any medications wear off before you are allowed to go home.  °Document Released: 08/05/2005 Document Revised: 03/06/2013 Document Reviewed: 12/21/2012 °[employer]® Patient Information ©2014 [employer]. ° °

## 2013-12-18 NOTE — Transfer of Care (Signed)
Immediate Anesthesia Transfer of Care Note  Patient: Leslie Alexander  Procedure(s) Performed: Procedure(s) with comments: CATARACT EXTRACTION PHACO AND INTRAOCULAR LENS PLACEMENT (IOC) (Left) - CDE 12.70  Patient Location: Short Stay  Anesthesia Type:MAC  Level of Consciousness: awake  Airway & Oxygen Therapy: Patient Spontanous Breathing  Post-op Assessment: Report given to PACU RN  Post vital signs: Reviewed  Complications: No apparent anesthesia complications

## 2013-12-18 NOTE — Anesthesia Postprocedure Evaluation (Signed)
  Anesthesia Post-op Note  Patient: Leslie Alexander  Procedure(s) Performed: Procedure(s) with comments: CATARACT EXTRACTION PHACO AND INTRAOCULAR LENS PLACEMENT (IOC) (Left) - CDE 12.70  Patient Location: Short Stay  Anesthesia Type:MAC  Level of Consciousness: awake, alert  and oriented  Airway and Oxygen Therapy: Patient Spontanous Breathing and Patient connected to face mask oxygen  Post-op Pain: none  Post-op Assessment: Post-op Vital signs reviewed, Patient's Cardiovascular Status Stable, Respiratory Function Stable, Patent Airway and No signs of Nausea or vomiting  Post-op Vital Signs: Reviewed and stable  Complications: No apparent anesthesia complications

## 2013-12-19 ENCOUNTER — Encounter (HOSPITAL_COMMUNITY): Payer: Self-pay | Admitting: Ophthalmology

## 2013-12-19 MED ORDER — NEOMYCIN-POLYMYXIN-DEXAMETH 3.5-10000-0.1 OP SUSP
OPHTHALMIC | Status: DC | PRN
  Administered 2013-12-18: 2 [drp] via OPHTHALMIC

## 2014-12-19 ENCOUNTER — Encounter (HOSPITAL_COMMUNITY): Payer: Self-pay

## 2014-12-19 ENCOUNTER — Emergency Department (HOSPITAL_COMMUNITY): Payer: Medicare PPO

## 2014-12-19 ENCOUNTER — Inpatient Hospital Stay (HOSPITAL_COMMUNITY)
Admission: EM | Admit: 2014-12-19 | Discharge: 2014-12-20 | DRG: 287 | Disposition: A | Discharge status: Home / Self Care | Payer: Medicare PPO | Attending: Cardiology | Admitting: Cardiology

## 2014-12-19 DIAGNOSIS — K219 Gastro-esophageal reflux disease without esophagitis: Secondary | ICD-10-CM | POA: Diagnosis present

## 2014-12-19 DIAGNOSIS — R072 Precordial pain: Secondary | ICD-10-CM

## 2014-12-19 DIAGNOSIS — I25110 Atherosclerotic heart disease of native coronary artery with unstable angina pectoris: Secondary | ICD-10-CM | POA: Diagnosis present

## 2014-12-19 DIAGNOSIS — I5032 Chronic diastolic (congestive) heart failure: Secondary | ICD-10-CM | POA: Diagnosis present

## 2014-12-19 DIAGNOSIS — Z6841 Body Mass Index (BMI) 40.0 and over, adult: Secondary | ICD-10-CM | POA: Diagnosis not present

## 2014-12-19 DIAGNOSIS — Z803 Family history of malignant neoplasm of breast: Secondary | ICD-10-CM

## 2014-12-19 DIAGNOSIS — I340 Nonrheumatic mitral (valve) insufficiency: Secondary | ICD-10-CM | POA: Diagnosis present

## 2014-12-19 DIAGNOSIS — E039 Hypothyroidism, unspecified: Secondary | ICD-10-CM | POA: Diagnosis present

## 2014-12-19 DIAGNOSIS — Z96652 Presence of left artificial knee joint: Secondary | ICD-10-CM | POA: Diagnosis present

## 2014-12-19 DIAGNOSIS — Z87891 Personal history of nicotine dependence: Secondary | ICD-10-CM

## 2014-12-19 DIAGNOSIS — R0781 Pleurodynia: Secondary | ICD-10-CM

## 2014-12-19 DIAGNOSIS — G4733 Obstructive sleep apnea (adult) (pediatric): Secondary | ICD-10-CM | POA: Diagnosis present

## 2014-12-19 DIAGNOSIS — I200 Unstable angina: Secondary | ICD-10-CM

## 2014-12-19 DIAGNOSIS — I480 Paroxysmal atrial fibrillation: Principal | ICD-10-CM | POA: Diagnosis present

## 2014-12-19 DIAGNOSIS — Z7982 Long term (current) use of aspirin: Secondary | ICD-10-CM

## 2014-12-19 DIAGNOSIS — R079 Chest pain, unspecified: Secondary | ICD-10-CM | POA: Insufficient documentation

## 2014-12-19 DIAGNOSIS — E785 Hyperlipidemia, unspecified: Secondary | ICD-10-CM | POA: Diagnosis present

## 2014-12-19 DIAGNOSIS — Z825 Family history of asthma and other chronic lower respiratory diseases: Secondary | ICD-10-CM

## 2014-12-19 DIAGNOSIS — I517 Cardiomegaly: Secondary | ICD-10-CM | POA: Diagnosis present

## 2014-12-19 DIAGNOSIS — I272 Other secondary pulmonary hypertension: Secondary | ICD-10-CM | POA: Diagnosis present

## 2014-12-19 DIAGNOSIS — I509 Heart failure, unspecified: Secondary | ICD-10-CM

## 2014-12-19 DIAGNOSIS — I10 Essential (primary) hypertension: Secondary | ICD-10-CM | POA: Diagnosis present

## 2014-12-19 DIAGNOSIS — I447 Left bundle-branch block, unspecified: Secondary | ICD-10-CM | POA: Diagnosis present

## 2014-12-19 DIAGNOSIS — Z888 Allergy status to other drugs, medicaments and biological substances status: Secondary | ICD-10-CM | POA: Diagnosis not present

## 2014-12-19 DIAGNOSIS — I209 Angina pectoris, unspecified: Secondary | ICD-10-CM

## 2014-12-19 LAB — URINALYSIS, ROUTINE W REFLEX MICROSCOPIC
BILIRUBIN URINE: NEGATIVE
Glucose, UA: NEGATIVE mg/dL
Hgb urine dipstick: NEGATIVE
Ketones, ur: NEGATIVE mg/dL
NITRITE: NEGATIVE
Protein, ur: NEGATIVE mg/dL
SPECIFIC GRAVITY, URINE: 1.008 (ref 1.005–1.030)
Urobilinogen, UA: 1 mg/dL (ref 0.0–1.0)
pH: 7 (ref 5.0–8.0)

## 2014-12-19 LAB — CBC WITH DIFFERENTIAL/PLATELET
BASOS ABS: 0 10*3/uL (ref 0.0–0.1)
Basophils Relative: 0 % (ref 0–1)
EOS PCT: 2 % (ref 0–5)
Eosinophils Absolute: 0.1 10*3/uL (ref 0.0–0.7)
HEMATOCRIT: 36.5 % (ref 36.0–46.0)
HEMOGLOBIN: 12.8 g/dL (ref 12.0–15.0)
Lymphocytes Relative: 39 % (ref 12–46)
Lymphs Abs: 2.8 10*3/uL (ref 0.7–4.0)
MCH: 30.7 pg (ref 26.0–34.0)
MCHC: 35.1 g/dL (ref 30.0–36.0)
MCV: 87.5 fL (ref 78.0–100.0)
MONOS PCT: 9 % (ref 3–12)
Monocytes Absolute: 0.7 10*3/uL (ref 0.1–1.0)
NEUTROS PCT: 50 % (ref 43–77)
Neutro Abs: 3.6 10*3/uL (ref 1.7–7.7)
Platelets: 191 10*3/uL (ref 150–400)
RBC: 4.17 MIL/uL (ref 3.87–5.11)
RDW: 13.5 % (ref 11.5–15.5)
WBC: 7.1 10*3/uL (ref 4.0–10.5)

## 2014-12-19 LAB — BASIC METABOLIC PANEL
ANION GAP: 11 (ref 5–15)
Anion gap: 7 (ref 5–15)
BUN: 26 mg/dL — ABNORMAL HIGH (ref 6–23)
BUN: 24 mg/dL — ABNORMAL HIGH (ref 6–23)
CALCIUM: 8.3 mg/dL — AB (ref 8.4–10.5)
CO2: 22 mmol/L (ref 19–32)
CO2: 25 mmol/L (ref 19–32)
Calcium: 9.1 mg/dL (ref 8.4–10.5)
Chloride: 101 mmol/L (ref 96–112)
Chloride: 102 mmol/L (ref 96–112)
Creatinine, Ser: 1.23 mg/dL — ABNORMAL HIGH (ref 0.50–1.10)
Creatinine, Ser: 1.14 mg/dL — ABNORMAL HIGH (ref 0.50–1.10)
GFR calc Af Amer: 55 mL/min — ABNORMAL LOW (ref >90)
GFR, EST AFRICAN AMERICAN: 50 mL/min — AB (ref >90)
GFR, EST NON AFRICAN AMERICAN: 43 mL/min — AB (ref >90)
GFR, EST NON AFRICAN AMERICAN: 47 mL/min — AB (ref >90)
GLUCOSE: 155 mg/dL — AB (ref 70–99)
Glucose, Bld: 160 mg/dL — ABNORMAL HIGH (ref 70–99)
POTASSIUM: 3.3 mmol/L — AB (ref 3.5–5.1)
Potassium: 3.8 mmol/L (ref 3.5–5.1)
SODIUM: 134 mmol/L — AB (ref 135–145)
Sodium: 134 mmol/L — ABNORMAL LOW (ref 135–145)

## 2014-12-19 LAB — CBC
HCT: 32.8 % — ABNORMAL LOW (ref 36.0–46.0)
Hemoglobin: 11.2 g/dL — ABNORMAL LOW (ref 12.0–15.0)
MCH: 29.9 pg (ref 26.0–34.0)
MCHC: 34.1 g/dL (ref 30.0–36.0)
MCV: 87.5 fL (ref 78.0–100.0)
PLATELETS: 175 10*3/uL (ref 150–400)
RBC: 3.75 MIL/uL — ABNORMAL LOW (ref 3.87–5.11)
RDW: 13.6 % (ref 11.5–15.5)
WBC: 5 10*3/uL (ref 4.0–10.5)

## 2014-12-19 LAB — BRAIN NATRIURETIC PEPTIDE: B NATRIURETIC PEPTIDE 5: 147.1 pg/mL — AB (ref 0.0–100.0)

## 2014-12-19 LAB — PROTIME-INR
INR: 1.03 (ref 0.00–1.49)
INR: 1.09 (ref 0.00–1.49)
Prothrombin Time: 13.6 seconds (ref 11.6–15.2)
Prothrombin Time: 14.2 seconds (ref 11.6–15.2)

## 2014-12-19 LAB — LIPID PANEL
CHOL/HDL RATIO: 4.8 ratio
CHOLESTEROL: 190 mg/dL (ref 0–200)
HDL: 40 mg/dL (ref >39)
LDL Cholesterol: 135 mg/dL — ABNORMAL HIGH (ref 0–99)
Triglycerides: 74 mg/dL (ref <150)
VLDL: 15 mg/dL (ref 0–40)

## 2014-12-19 LAB — TROPONIN I
Troponin I: <0.03 ng/mL (ref <0.031)
Troponin I: <0.03 ng/mL (ref <0.031)
Troponin I: <0.03 ng/mL (ref <0.031)

## 2014-12-19 LAB — APTT: aPTT: 56 seconds — ABNORMAL HIGH (ref 24–37)

## 2014-12-19 LAB — HEPARIN LEVEL (UNFRACTIONATED)
HEPARIN UNFRACTIONATED: 0.45 [IU]/mL (ref 0.30–0.70)
Heparin Unfractionated: 0.45 IU/mL (ref 0.30–0.70)

## 2014-12-19 LAB — URINE MICROSCOPIC-ADD ON

## 2014-12-19 LAB — D-DIMER, QUANTITATIVE (NOT AT ARMC): D DIMER QUANT: 0.55 ug{FEU}/mL — AB (ref 0.00–0.48)

## 2014-12-19 LAB — HEMOGLOBIN A1C
Hgb A1c MFr Bld: 6.5 % — ABNORMAL HIGH (ref <5.7)
Mean Plasma Glucose: 140 mg/dL — ABNORMAL HIGH (ref <117)

## 2014-12-19 MED ORDER — ASPIRIN 81 MG PO CHEW: 81.0000 mg | CHEWABLE_TABLET | ORAL | Status: DC

## 2014-12-19 MED ORDER — HEPARIN BOLUS VIA INFUSION
4000.0000 [IU] | Freq: Once | INTRAVENOUS | Status: AC
  Administered 2014-12-19: 4000 [IU] via INTRAVENOUS
  Filled 2014-12-19: qty 4000

## 2014-12-19 MED ORDER — PERFLUTREN LIPID MICROSPHERE
INTRAVENOUS | Status: DC
Start: 2014-12-19 — End: 2014-12-19
  Filled 2014-12-19: qty 10

## 2014-12-19 MED ORDER — PANTOPRAZOLE SODIUM 40 MG PO TBEC
80.0000 mg | DELAYED_RELEASE_TABLET | Freq: Every day | ORAL | Status: DC
  Administered 2014-12-19 – 2014-12-20 (×2): 80 mg via ORAL
  Filled 2014-12-19 (×2): qty 2

## 2014-12-19 MED ORDER — TRAMADOL HCL 50 MG PO TABS
50.0000 mg | ORAL_TABLET | ORAL | Status: DC
  Administered 2014-12-20: 50 mg via ORAL
  Filled 2014-12-19: qty 1

## 2014-12-19 MED ORDER — NITROGLYCERIN IN D5W 200-5 MCG/ML-% IV SOLN
INTRAVENOUS | Status: AC
  Filled 2014-12-19: qty 250

## 2014-12-19 MED ORDER — SODIUM CHLORIDE 0.9 % IV SOLN: 250.0000 mL | INTRAVENOUS | Status: DC | PRN

## 2014-12-19 MED ORDER — NITROGLYCERIN 0.4 MG SL SUBL: 0.4000 mg | SUBLINGUAL_TABLET | SUBLINGUAL | Status: DC | PRN

## 2014-12-19 MED ORDER — PNEUMOCOCCAL VAC POLYVALENT 25 MCG/0.5ML IJ INJ
0.5000 mL | INJECTION | INTRAMUSCULAR | Status: DC
  Filled 2014-12-19: qty 0.5

## 2014-12-19 MED ORDER — TRAMADOL HCL 50 MG PO TABS: 50.0000 mg | ORAL_TABLET | Freq: Three times a day (TID) | ORAL | Status: DC

## 2014-12-19 MED ORDER — ASPIRIN EC 81 MG PO TBEC: 81.0000 mg | DELAYED_RELEASE_TABLET | Freq: Every day | ORAL | Status: DC

## 2014-12-19 MED ORDER — FUROSEMIDE 40 MG PO TABS
40.0000 mg | ORAL_TABLET | Freq: Two times a day (BID) | ORAL | Status: DC
  Administered 2014-12-19 – 2014-12-20 (×2): 40 mg via ORAL
  Filled 2014-12-19 (×4): qty 1

## 2014-12-19 MED ORDER — LORATADINE 10 MG PO TABS
10.0000 mg | ORAL_TABLET | Freq: Every day | ORAL | Status: DC
  Administered 2014-12-20: 10 mg via ORAL
  Filled 2014-12-19 (×2): qty 1

## 2014-12-19 MED ORDER — ACETAMINOPHEN 325 MG PO TABS
650.0000 mg | ORAL_TABLET | ORAL | Status: DC | PRN
  Administered 2014-12-19 – 2014-12-20 (×2): 650 mg via ORAL
  Filled 2014-12-19 (×2): qty 2

## 2014-12-19 MED ORDER — ASPIRIN 300 MG RE SUPP: 300.0000 mg | RECTAL | Status: DC

## 2014-12-19 MED ORDER — IRBESARTAN 75 MG PO TABS
75.0000 mg | ORAL_TABLET | Freq: Every day | ORAL | Status: DC
  Administered 2014-12-19 – 2014-12-20 (×2): 75 mg via ORAL
  Filled 2014-12-19 (×2): qty 1

## 2014-12-19 MED ORDER — SODIUM CHLORIDE 0.9 % IV BOLUS (SEPSIS)
1000.0000 mL | Freq: Once | INTRAVENOUS | Status: AC
  Administered 2014-12-19: 1000 mL via INTRAVENOUS

## 2014-12-19 MED ORDER — IOHEXOL 350 MG/ML SOLN
100.0000 mL | Freq: Once | INTRAVENOUS | Status: AC | PRN
  Administered 2014-12-19: 100 mL via INTRAVENOUS

## 2014-12-19 MED ORDER — HEPARIN (PORCINE) IN NACL 100-0.45 UNIT/ML-% IJ SOLN
1350.0000 [IU]/h | INTRAMUSCULAR | Status: DC
  Administered 2014-12-19: 1350 [IU]/h via INTRAVENOUS
  Filled 2014-12-19 (×4): qty 250

## 2014-12-19 MED ORDER — VALSARTAN-HYDROCHLOROTHIAZIDE 80-12.5 MG PO TABS: 1.0000 | ORAL_TABLET | Freq: Every day | ORAL | Status: DC

## 2014-12-19 MED ORDER — SODIUM CHLORIDE 0.9 % IV SOLN: INTRAVENOUS | Status: DC

## 2014-12-19 MED ORDER — MORPHINE SULFATE 2 MG/ML IJ SOLN
2.0000 mg | INTRAMUSCULAR | Status: DC | PRN
  Administered 2014-12-19 (×2): 2 mg via INTRAVENOUS
  Filled 2014-12-19: qty 1

## 2014-12-19 MED ORDER — ASPIRIN 81 MG PO CHEW
81.0000 mg | CHEWABLE_TABLET | Freq: Once | ORAL | Status: AC
  Administered 2014-12-19: 81 mg via ORAL
  Filled 2014-12-19: qty 1

## 2014-12-19 MED ORDER — ASPIRIN 81 MG PO CHEW: 324.0000 mg | CHEWABLE_TABLET | ORAL | Status: DC

## 2014-12-19 MED ORDER — SODIUM CHLORIDE 0.9 % IV SOLN
INTRAVENOUS | Status: DC
  Administered 2014-12-20: 75 mL/h via INTRAVENOUS

## 2014-12-19 MED ORDER — METOPROLOL TARTRATE 25 MG PO TABS
25.0000 mg | ORAL_TABLET | Freq: Four times a day (QID) | ORAL | Status: DC
  Administered 2014-12-19 – 2014-12-20 (×4): 25 mg via ORAL
  Filled 2014-12-19 (×7): qty 1

## 2014-12-19 MED ORDER — SODIUM CHLORIDE 0.9 % IJ SOLN: 3.0000 mL | INTRAMUSCULAR | Status: DC | PRN

## 2014-12-19 MED ORDER — MORPHINE SULFATE 2 MG/ML IJ SOLN
INTRAMUSCULAR | Status: AC
  Filled 2014-12-19: qty 1

## 2014-12-19 MED ORDER — HYDROCHLOROTHIAZIDE 12.5 MG PO CAPS
12.5000 mg | ORAL_CAPSULE | Freq: Every day | ORAL | Status: DC
  Administered 2014-12-19 – 2014-12-20 (×2): 12.5 mg via ORAL
  Filled 2014-12-19 (×2): qty 1

## 2014-12-19 MED ORDER — GABAPENTIN 300 MG PO CAPS
300.0000 mg | ORAL_CAPSULE | Freq: Three times a day (TID) | ORAL | Status: DC
  Administered 2014-12-19 – 2014-12-20 (×4): 300 mg via ORAL
  Filled 2014-12-19 (×6): qty 1

## 2014-12-19 MED ORDER — SODIUM CHLORIDE 0.9 % IJ SOLN
3.0000 mL | Freq: Two times a day (BID) | INTRAMUSCULAR | Status: DC
  Administered 2014-12-19: 3 mL via INTRAVENOUS

## 2014-12-19 MED ORDER — LEVOTHYROXINE SODIUM 100 MCG PO TABS
100.0000 ug | ORAL_TABLET | Freq: Every day | ORAL | Status: DC
  Administered 2014-12-20: 100 ug via ORAL
  Filled 2014-12-19 (×3): qty 1

## 2014-12-19 MED ORDER — TRAMADOL HCL 50 MG PO TABS
100.0000 mg | ORAL_TABLET | Freq: Two times a day (BID) | ORAL | Status: DC
  Administered 2014-12-19 – 2014-12-20 (×2): 100 mg via ORAL
  Filled 2014-12-19 (×2): qty 2

## 2014-12-19 MED ORDER — SODIUM CHLORIDE 0.9 % IV BOLUS (SEPSIS)
500.0000 mL | Freq: Once | INTRAVENOUS | Status: AC
  Administered 2014-12-19: 500 mL via INTRAVENOUS

## 2014-12-19 MED ORDER — NITROGLYCERIN IN D5W 200-5 MCG/ML-% IV SOLN
2.0000 ug/min | INTRAVENOUS | Status: DC
  Administered 2014-12-19: 5 ug/min via INTRAVENOUS

## 2014-12-19 MED ORDER — ONDANSETRON HCL 4 MG/2ML IJ SOLN
4.0000 mg | Freq: Four times a day (QID) | INTRAMUSCULAR | Status: DC | PRN
Start: 2014-12-19 — End: 2014-12-20
  Administered 2014-12-19: 4 mg via INTRAVENOUS
  Filled 2014-12-19: qty 2

## 2014-12-19 MED ORDER — POTASSIUM CHLORIDE CRYS ER 20 MEQ PO TBCR
20.0000 meq | EXTENDED_RELEASE_TABLET | Freq: Every day | ORAL | Status: DC
  Administered 2014-12-19 – 2014-12-20 (×2): 20 meq via ORAL
  Filled 2014-12-19 (×2): qty 1

## 2014-12-19 MED ORDER — ASPIRIN EC 81 MG PO TBEC
81.0000 mg | DELAYED_RELEASE_TABLET | Freq: Every day | ORAL | Status: DC
  Filled 2014-12-19: qty 1

## 2014-12-19 NOTE — ED Notes (Addendum)
Per EMS - pt arrived at [facility] around 2300 on 1/26 with complaints of "heart jumping from fast to slow." Pt had "been at home, sitting in chair and experienced severe crushing substernal pain radiating into chest w/ clammy skin." Pt transported to [facility] b/c c/o widespread chest pain, AFib on monitor. Pt had previous cardioversion d/t afib diagnosis in 2013 but does not take anticoagulant. Since cardioversion, pt has been NSR on previous EKGs. Presented to ED in Afib. Pt given zofran, 8mg  morphine, 324ASA, 1 inch of nitro paste with some relief; given 4000 units heparin at 2340. Pt has previously experienced symptoms similar to today. Last set VS - 135/68, irregular HR. Pain initially widespread across chest, mostly substernal while en route to [facility].

## 2014-12-19 NOTE — ED Provider Notes (Signed)
CSN: [id number]     Arrival date & time 12/19/14  0041 History  This chart was scribed for Derwood Kaplan, MD by Annye Asa, ED Scribe. This patient was seen in room [facility] and the patient's care was started at 12:40 AM.    Chief Complaint  Patient presents with  . Chest Pain   Patient is a 74 y.o. female presenting with chest pain. The history is provided by the patient and the EMS personnel. No language interpreter was used.  Chest Pain Associated symptoms: nausea, palpitations and shortness of breath   Associated symptoms: no abdominal pain, no cough and not vomiting      HPI Comments: Leslie Alexander is a 74 y.o. female with past medical history of Afib (no meds at this time), LBB, diastolic heart failure, and pulmonary hypertension who presents to the Emergency Department complaining of chest pain, originally rated 9/10. Patient reports constant chest pain, midsternal and L sided with associated nausea, SOB that has improved with time, rated 4/10 at present. Per EMS, patient arrived at [facility] around 23:00 on 1/26 with complaints of her "heart jumping from fast to slow." She was transported here to [facility] ED with concerns for ACS. Patient has previously had cardioversion due to Afib but has been NSR on previous EKGs; she presented to ED in Afib today. She reports prior experience with similar symptoms. No cardiac workup in the past.  Per EMS, patient was given sofra, 8mg  morphine, 324 aspirin, nitro paste with some relief. She was given 4000 units of heparin at 23:40.   Past Medical History  Diagnosis Date  . Atrial fibrillation   . Essential hypertension, benign   . Mixed hyperlipidemia   . Hypothyroidism   . Depression   . Morbid obesity   . Lumbar disc disease   . GERD (gastroesophageal reflux disease)   . Nonalcoholic fatty liver disease   . History of breast cancer   . Meralgia paresthetica, right   . Left bundle branch block     negative Lexiscan Myoview; EF 56%, 3/13    . Diastolic heart failure     LVEF 55-60%  . Pulmonary hypertension     RVSP 55-60 mm mercury  . Hx of atrial fibrillation, no current medication     converted out of A. Fib with cardioversion  . OSA (obstructive sleep apnea)     CPAP machine   Past Surgical History  Procedure Laterality Date  . Tubal ligation      BILATERAL  . Laparoscopic cholecystectomy  03/02/2011    Dr. Gabriel Cirri  . Cataract extraction, bilateral  04/19/2008    Dr. Alto Denver  . Femoral exploration  12/2003    RIGHT LATERAL FEMORAL CUTANEOUS NERVE/Dr. Channing Mutters  . Knee arthroscopy  10/2005    Left knee/from torn cartilage Dr. Edger House  . Heel spur surgery  07/2006    For spurs/Dr. Ulice Brilliant  . Pars plana vitrectomy w/ repair of macular hole  05/2008    Dr. Luciana Axe  . Total knee arthroplasty Left 12/29/2011    Dr. Chaney Malling  . Incision and drainage of wound  01/26/2012    Dr. Chaney Malling  . Mastectomy, radical Left 1989    With chemotherapy  . Cataract extraction w/phaco Left 12/18/2013    Procedure: CATARACT EXTRACTION PHACO AND INTRAOCULAR LENS PLACEMENT (IOC);  Surgeon: Gemma Payor, MD;  Location: [facility];  Service: Ophthalmology;  Laterality: Left;  CDE 12.70   Family History  Problem Relation Age of Onset  . Other Father  Ulcer disease  . COPD Mother   . Breast cancer Sister   . Breast cancer Sister    History  Substance Use Topics  . Smoking status: Former Smoker -- 0.50 packs/day for 10 years    Types: Cigarettes    Quit date: 11/24/1987  . Smokeless tobacco: Never Used  . Alcohol Use: No   OB History    No data available     Review of Systems  Constitutional: Positive for activity change.  HENT: Negative for facial swelling and sore throat.   Respiratory: Positive for shortness of breath. Negative for cough and wheezing.   Cardiovascular: Positive for chest pain and palpitations.  Gastrointestinal: Positive for nausea. Negative for vomiting, abdominal pain, diarrhea, constipation, blood in stool  and abdominal distention.  Genitourinary: Negative for hematuria and difficulty urinating.  Musculoskeletal: Negative for neck pain.  Skin: Negative for color change.  Neurological: Negative for speech difficulty.  Hematological: Does not bruise/bleed easily.  Psychiatric/Behavioral: Negative for confusion.   Allergies  Codeine and Ace inhibitors  Home Medications   Prior to Admission medications   Medication Sig Start Date End Date Taking? Authorizing Provider  aspirin EC 81 MG tablet Take 81 mg by mouth daily.   Yes Historical Provider, MD  beta carotene w/minerals (OCUVITE) tablet Take 1 tablet by mouth daily.   Yes Historical Provider, MD  Calcium Carbonate-Vitamin D (CALCIUM + D PO) Take 1 tablet by mouth daily.   Yes Historical Provider, MD  Cyanocobalamin (VITAMIN B-12 PO) Take 1 tablet by mouth daily.   Yes Historical Provider, MD  fexofenadine (ALLEGRA) 180 MG tablet Take 180 mg by mouth daily as needed for allergies.    Yes Historical Provider, MD  furosemide (LASIX) 40 MG tablet Take 40 mg by mouth 2 (two) times daily. 04/01/12 12/19/14 Yes Eugene C Serpe, PA-C  gabapentin (NEURONTIN) 300 MG capsule Take 300 mg by mouth 3 (three) times daily.   Yes Historical Provider, MD  levothyroxine (SYNTHROID, LEVOTHROID) 100 MCG tablet Take 100 mcg by mouth daily.   Yes Historical Provider, MD  metoprolol succinate (TOPROL-XL) 100 MG 24 hr tablet Take 100 mg by mouth daily. Take with or immediately following a meal.   Yes Historical Provider, MD  Misc Natural Products (OSTEO BI-FLEX JOINT SHIELD PO) Take 1 tablet by mouth daily.   Yes Historical Provider, MD  Multiple Vitamins-Minerals (MULTIVITAMINS THER. W/MINERALS) TABS tablet Take 1 tablet by mouth daily.   Yes Historical Provider, MD  omeprazole (PRILOSEC) 20 MG capsule Take 20 mg by mouth daily.   Yes Historical Provider, MD  potassium chloride SA (K-DUR,KLOR-CON) 20 MEQ tablet Take 20 mEq by mouth daily.  04/01/12 12/19/14 Yes Eugene C  Serpe, PA-C  traMADol (ULTRAM) 50 MG tablet Take 50-100 mg by mouth 3 (three) times daily. 2 in the morning, 1 in afternoon, and 1 at bedtime.   Yes Historical Provider, MD  valsartan-hydrochlorothiazide (DIOVAN-HCT) 80-12.5 MG per tablet Take 1 tablet by mouth daily.   Yes Historical Provider, MD  ALPRAZolam Prudy Feeler) 0.5 MG tablet Take 0.25 mg by mouth daily.  04/24/12   Historical Provider, MD  citalopram (CELEXA) 40 MG tablet Take 20 mg by mouth daily.     Historical Provider, MD  metoprolol succinate (TOPROL-XL) 25 MG 24 hr tablet Take 25 mg by mouth daily.    Historical Provider, MD  Red Yeast Rice Extract (RED YEAST RICE PO) Take 1 tablet by mouth daily.    Historical Provider, MD   BP  106/40 mmHg  Pulse 55  Resp 11  Ht 5\' 5"  (1.651 m)  Wt 282 lb (127.914 kg)  BMI 46.93 kg/m2  SpO2 97% Physical Exam  Constitutional: She is oriented to person, place, and time. She appears well-developed and well-nourished. No distress.  HENT:  Head: Normocephalic and atraumatic.  Mouth/Throat: Oropharynx is clear and moist. No oropharyngeal exudate.  Eyes: EOM are normal. Pupils are equal, round, and reactive to light.  Neck: Normal range of motion. Neck supple. No JVD present.  Cardiovascular: Normal rate.   Murmur heard. Pulmonary/Chest: Effort normal. No respiratory distress. She has no wheezes. She has no rales.  Abdominal: Soft. There is no tenderness. There is no rebound and no guarding.  Musculoskeletal: Normal range of motion. She exhibits no edema or tenderness.  Neurological: She is alert and oriented to person, place, and time.  Skin: Skin is warm and dry. No rash noted.  Psychiatric: She has a normal mood and affect. Her behavior is normal.  Nursing note and vitals reviewed.   ED Course  Procedures   DIAGNOSTIC STUDIES:   COORDINATION OF CARE: 4:12 AM Discussed treatment plan with pt at bedside and pt agreed to plan.   Labs Review Labs Reviewed  BASIC METABOLIC PANEL -  Abnormal; Notable for the following:    Sodium 134 (*)    Potassium 3.3 (*)    Glucose, Bld 160 (*)    BUN 26 (*)    Creatinine, Ser 1.23 (*)    GFR calc non Af Amer 43 (*)    GFR calc Af Amer 50 (*)    All other components within normal limits  APTT - Abnormal; Notable for the following:    aPTT 56 (*)    All other components within normal limits  URINALYSIS, ROUTINE W REFLEX MICROSCOPIC - Abnormal; Notable for the following:    Leukocytes, UA TRACE (*)    All other components within normal limits  BRAIN NATRIURETIC PEPTIDE - Abnormal; Notable for the following:    B Natriuretic Peptide 147.1 (*)    All other components within normal limits  D-DIMER, QUANTITATIVE - Abnormal; Notable for the following:    D-Dimer, Quant 0.55 (*)    All other components within normal limits  URINE MICROSCOPIC-ADD ON - Abnormal; Notable for the following:    Squamous Epithelial / LPF FEW (*)    All other components within normal limits  CBC WITH DIFFERENTIAL/PLATELET  PROTIME-INR  TROPONIN I    Imaging Review Dg Chest Port 1 View  12/19/2014   CLINICAL DATA:  Mid chest pain and shortness of breath.  EXAM: PORTABLE CHEST - 1 VIEW  COMPARISON:  03/18/2012  FINDINGS: Mild cardiac enlargement and diffuse pulmonary vascular congestion. Slight interstitial change in the lung bases suggest early edema. No focal consolidation. No blunting of costophrenic angles. No pneumothorax. Calcified and tortuous aorta. Surgical clips in the left axilla.  IMPRESSION: Cardiac enlargement with mild pulmonary vascular congestion edema.   Electronically Signed   By: Burman Nieves M.D.   On: 12/19/2014 01:02     EKG Interpretation   Date/Time:  Wednesday December 19 2014 01:09:15 EST Ventricular Rate:  102 PR Interval:    QRS Duration: 161 QT Interval:  433 QTC Calculation: 564 R Axis:   -60 Text Interpretation:  Atrial fibrillation Left bundle branch block  Negative for STEMI per Sgarbossa criteria Confirmed by  Rhunette Croft, MD, Janey Genta  [id number]) on 12/19/2014 1:27:51 AM      MDM   Final  diagnoses:  Chest pain  Pleuritic chest pain  Unstable angina   I personally performed the services described in this documentation, which was scribed in my presence. The recorded information has been reviewed and is accurate.  Differential diagnosis includes: ACS syndrome CHF exacerbation Valvular disorder Myocarditis Pericarditis Pericardial effusion Pneumonia Pleural effusion Pulmonary edema PE Anemia Musculoskeletal pain Gastritis  Pt comes in with cc of chest pain. Pt has typica sounding chest pain with concerning risk factors. EKG shows LBBB, and no significant changes from previous ekg. No known CAD hx, or workup.  Pt initially denies pleuritic component to her chest pain, however, when cardiologist came to see her, she did indicate that her pain is now pleuritic. WELLS score is 0 for PE, Dimer ordered, and is slightly elevated. Cards team thinks that story is equivocal for PE vs. ACS, and it is better to r/o PE with a non invasive test than a CATH - which makes sense. CT PE ordered.  Heparin started. Will be admitted once the CT PE is completed.   CRITICAL CARE Performed by: Derwood Kaplan   Total critical care time: 55 min  Critical care time was exclusive of separately billable procedures and treating other patients.  Critical care was necessary to treat or prevent imminent or life-threatening deterioration.  Critical care was time spent personally by me on the following activities: development of treatment plan with patient and/or surrogate as well as nursing, discussions with consultants, evaluation of patient's response to treatment, examination of patient, obtaining history from patient or surrogate, ordering and performing treatments and interventions, ordering and review of laboratory studies, ordering and review of radiographic studies, pulse oximetry and re-evaluation of patient's  condition.   Derwood Kaplan, MD 12/19/14 [phone]

## 2014-12-19 NOTE — Care Management Note (Addendum)
    Page 1 of 1   12/20/2014     11:06:39 AM CARE MANAGEMENT NOTE 12/20/2014  Patient:  Leslie Alexander, Leslie Alexander   Account Number:  [id number]  Date Initiated:  12/19/2014  Documentation initiated by:  Junius Creamer  Subjective/Objective Assessment:   adm w ch pain/angina     Action/Plan:   lives w husband, pcp dr Aurther Loft daniel   Anticipated DC Date:     Anticipated DC Plan:  HOME/SELF CARE      DC Planning Services  CM consult  Medication Assistance      Choice offered to / List presented to:             Status of service:   Medicare Important Message given?   (If response is "NO", the following Medicare IM given date fields will be blank) Date Medicare IM given:   Medicare IM given by:   Date Additional Medicare IM given:   Additional Medicare IM given by:    Discharge Disposition:  HOME/SELF CARE  Per UR Regulation:  Reviewed for med. necessity/level of care/duration of stay  If discussed at Long Length of Stay Meetings, dates discussed:    Comments:  1/28 1105 debbie [name] rn,bsn gave pt 30day free eliquis card. will try and find out copay w [employer].

## 2014-12-19 NOTE — ED Notes (Signed)
Patient transported to CT 

## 2014-12-19 NOTE — Progress Notes (Signed)
ANTICOAGULATION CONSULT NOTE   Pharmacy Consult for Heparin Indication: chest pain/ACS/r/o PE  Allergies  Allergen Reactions  . Codeine Palpitations  . Ace Inhibitors Cough    Patient Measurements: Height: 5\' 5"  (165.1 cm) Weight: 298 lb 1 oz (135.2 kg) IBW/kg (Calculated) : 57 Heparin Dosing Weight: 90 kg  Vital Signs: Temp: 97.7 F (36.5 C) (01/27 1113) Temp Source: Oral (01/27 1113) BP: 104/40 mmHg (01/27 1113) Pulse Rate: 51 (01/27 1113)  Labs:  Recent Labs  12/19/14 0057 12/19/14 0644 12/19/14 0645 12/19/14 1126 12/19/14 1127  HGB 12.8  --  11.2*  --   --   HCT 36.5  --  32.8*  --   --   PLT 191  --  175  --   --   APTT 56*  --   --   --   --   LABPROT 13.6  --  14.2  --   --   INR 1.03  --  1.09  --   --   HEPARINUNFRC  --   --   --  0.45  --   CREATININE 1.23*  --  1.14*  --   --   TROPONINI <0.03 <0.03  --   --  <0.03    Estimated Creatinine Clearance: 63.1 mL/min (by C-G formula based on Cr of 1.14).   Medical History: Past Medical History  Diagnosis Date  . Atrial fibrillation   . Essential hypertension, benign   . Mixed hyperlipidemia   . Hypothyroidism   . Depression   . Morbid obesity   . Lumbar disc disease   . GERD (gastroesophageal reflux disease)   . Nonalcoholic fatty liver disease   . History of breast cancer   . Meralgia paresthetica, right   . Left bundle branch block     negative Lexiscan Myoview; EF 56%, 3/13   . Diastolic heart failure     LVEF 55-60%  . Pulmonary hypertension     RVSP 55-60 mm mercury  . Hx of atrial fibrillation, no current medication     converted out of A. Fib with cardioversion  . OSA (obstructive sleep apnea)     CPAP machine    Medications:  Celexa  Toprol XL  Xanax  ASA  Allegra  Lasix  Neurontin  Synthroid  MVI  Prilosec  KCl  Ultram  Diovan HCT  Assessment: 74 y.o. female with chest pain, posiible PE vs ACS started on heparin. Initial heparin level is at goal on 1350 units/hr. No bleeding  issues noted.  Plan for cath today. PAF>>NSR this morning, CHADS 2 VASC score of at lease 4( woman, age 34, HTN, HFpEF). Follow up post-cath for possible anticoagulation.  Goal of Therapy:  Heparin level 0.3-0.7 units/ml Monitor platelets by anticoagulation protocol: Yes   Plan:  Continue heparin at 1350 units/hr Follow up plan post-cath  Sheppard Coil PharmD., BCPS Clinical Pharmacist Pager [phone] 12/19/2014 1:39 PM

## 2014-12-19 NOTE — H&P (Addendum)
Patient ID: Leslie Alexander MRN: [id number], DOB/AGE: 1941/02/02   Admit date: 12/19/2014   Primary Physician: Donzetta Sprung, MD Primary Cardiologist: Peyton Bottoms, MD  Pt. Profile:  74F with pAF, HTB, HLD, OSA, HFpEF, LBBB, pHTN who presents with acute onset chest pain.   Problem List  Past Medical History  Diagnosis Date  . Atrial fibrillation   . Essential hypertension, benign   . Mixed hyperlipidemia   . Hypothyroidism   . Depression   . Morbid obesity   . Lumbar disc disease   . GERD (gastroesophageal reflux disease)   . Nonalcoholic fatty liver disease   . History of breast cancer   . Meralgia paresthetica, right   . Left bundle branch block     negative Lexiscan Myoview; EF 56%, 3/13   . Diastolic heart failure     LVEF 55-60%  . Pulmonary hypertension     RVSP 55-60 mm mercury  . Hx of atrial fibrillation, no current medication     converted out of A. Fib with cardioversion  . OSA (obstructive sleep apnea)     CPAP machine    Past Surgical History  Procedure Laterality Date  . Tubal ligation      BILATERAL  . Laparoscopic cholecystectomy  03/02/2011    Dr. Gabriel Cirri  . Cataract extraction, bilateral  04/19/2008    Dr. Alto Denver  . Femoral exploration  12/2003    RIGHT LATERAL FEMORAL CUTANEOUS NERVE/Dr. Channing Mutters  . Knee arthroscopy  10/2005    Left knee/from torn cartilage Dr. Edger House  . Heel spur surgery  07/2006    For spurs/Dr. Ulice Brilliant  . Pars plana vitrectomy w/ repair of macular hole  05/2008    Dr. Luciana Axe  . Total knee arthroplasty Left 12/29/2011    Dr. Chaney Malling  . Incision and drainage of wound  01/26/2012    Dr. Chaney Malling  . Mastectomy, radical Left 1989    With chemotherapy  . Cataract extraction w/phaco Left 12/18/2013    Procedure: CATARACT EXTRACTION PHACO AND INTRAOCULAR LENS PLACEMENT (IOC);  Surgeon: Gemma Payor, MD;  Location: [facility];  Service: Ophthalmology;  Laterality: Left;  CDE 12.70     Allergies  Allergies  Allergen Reactions    . Codeine Palpitations  . Ace Inhibitors Cough    HPI  74F with pAF, HTB, HLD, OSA, HFpEF, LBBB, pHTN who presents with acute onset chest pain. She reports she was in her USOH and about to go to bed when she developed acute onset chest and throat pain, as if her "heart were stuck" in her throat. She had associated SOB, diaphoresis, dizziness. It was 9/10 at its worst initially. She was transferred to [facility] ER for initial evaluation. She was found to have rate controlled AF with LBBB and Dr. Allyson Sabal was initially called for a STEMI activation, which was cancelled after comparing to a prior.Per EMS, patient was given zofran,  morphine, 324 aspirin, nitro paste with some relief. She was given 4000 units of heparin at 23:40.  She was transferred to [facility] for further evaluation. On arrival, she continued to have 4-5/10 chest pain and was hemodynamically stable, saturating 97% on RA. ECG demonstrated rate controlled AF with old LBBB.  CXR demonstrated cardiomegaly with mild pulmonary vascular congestion.  Initial labs notable for K 3.3, Cr 1.23, BNO 147, TnI <0.03, d-dimer 0.55. Chest CT angiogram demonstrated no PE or aortic dissection. With additional analgesics, pain improved.   Of note, Ms. Kinderman reports that she has had  these symptoms in the setting of AF before and that they improved with DCCV. However, by chart review, it also appears that she had AF in the outpatient setting that was not as severe symptom wise compared to what brought her into the ED.   Home Medications  Prior to Admission medications   Medication Sig Start Date End Date Taking? Authorizing Provider  aspirin EC 81 MG tablet Take 81 mg by mouth daily.   Yes Historical Provider, MD  beta carotene w/minerals (OCUVITE) tablet Take 1 tablet by mouth daily.   Yes Historical Provider, MD  Calcium Carbonate-Vitamin D (CALCIUM + D PO) Take 1 tablet by mouth daily.   Yes Historical Provider, MD  Cyanocobalamin (VITAMIN B-12 PO) Take 1  tablet by mouth daily.   Yes Historical Provider, MD  fexofenadine (ALLEGRA) 180 MG tablet Take 180 mg by mouth daily as needed for allergies.    Yes Historical Provider, MD  furosemide (LASIX) 40 MG tablet Take 40 mg by mouth 2 (two) times daily. 04/01/12 12/19/14 Yes Eugene C Serpe, PA-C  gabapentin (NEURONTIN) 300 MG capsule Take 300 mg by mouth 3 (three) times daily.   Yes Historical Provider, MD  levothyroxine (SYNTHROID, LEVOTHROID) 100 MCG tablet Take 100 mcg by mouth daily.   Yes Historical Provider, MD  metoprolol succinate (TOPROL-XL) 100 MG 24 hr tablet Take 100 mg by mouth daily. Take with or immediately following a meal.   Yes Historical Provider, MD  Misc Natural Products (OSTEO BI-FLEX JOINT SHIELD PO) Take 1 tablet by mouth daily.   Yes Historical Provider, MD  Multiple Vitamins-Minerals (MULTIVITAMINS THER. W/MINERALS) TABS tablet Take 1 tablet by mouth daily.   Yes Historical Provider, MD  omeprazole (PRILOSEC) 20 MG capsule Take 20 mg by mouth daily.   Yes Historical Provider, MD  potassium chloride SA (K-DUR,KLOR-CON) 20 MEQ tablet Take 20 mEq by mouth daily.  04/01/12 12/19/14 Yes Eugene C Serpe, PA-C  traMADol (ULTRAM) 50 MG tablet Take 50-100 mg by mouth 3 (three) times daily. 2 in the morning, 1 in afternoon, and 1 at bedtime.   Yes Historical Provider, MD  valsartan-hydrochlorothiazide (DIOVAN-HCT) 80-12.5 MG per tablet Take 1 tablet by mouth daily.   Yes Historical Provider, MD    Family History  Family History  Problem Relation Age of Onset  . Other Father     Ulcer disease  . COPD Mother   . Breast cancer Sister   . Breast cancer Sister     Social History  History   Social History  . Marital Status: Married    Spouse Name: N/A    Number of Children: N/A  . Years of Education: N/A   Occupational History  . Not on file.   Social History Main Topics  . Smoking status: Former Smoker -- 0.50 packs/day for 10 years    Types: Cigarettes    Quit date:  11/24/1987  . Smokeless tobacco: Never Used  . Alcohol Use: No  . Drug Use: No  . Sexual Activity: Not on file   Other Topics Concern  . Not on file   Social History Narrative   Has 1 daughter     Review of Systems General:  No chills, fever, night sweats or weight changes.  Cardiovascular: See HPI Dermatological: No rash, lesions/masses Respiratory: See HPI Urologic: No hematuria, dysuria Abdominal:   No nausea, vomiting, diarrhea, bright red blood per rectum, melena, or hematemesis Neurologic:  No visual changes, wkns, changes in mental status. All other  systems reviewed and are otherwise negative except as noted above.  Physical Exam  Blood pressure 109/77, pulse 55, resp. rate 17, height 5\' 5"  (1.651 m), weight 127.914 kg (282 lb), SpO2 97 %.  General: Pleasant, obese, moderate distress.  Psych: Normal affect. Neuro: Alert and oriented X 3. Moves all extremities spontaneously. HEENT: Normal  Neck: Supple without bruits or JVD. Lungs:  Resp regular and unlabored, CTA. Heart: RRR no s3, s4, or murmurs. Abdomen: Soft, non-tender, non-distended, BS + x 4.  Extremities: No clubbing, cyanosis or edema. DP/PT/Radials 2+ and equal bilaterally.  Labs  Troponin (Point of Care Test) No results for input(s): TROPIPOC in the last 72 hours.  Recent Labs  12/19/14 0057  TROPONINI <0.03   Lab Results  Component Value Date   WBC 7.1 12/19/2014   HGB 12.8 12/19/2014   HCT 36.5 12/19/2014   MCV 87.5 12/19/2014   PLT 191 12/19/2014    Recent Labs Lab 12/19/14 0057  NA 134*  K 3.3*  CL 101  CO2 22  BUN 26*  CREATININE 1.23*  CALCIUM 9.1  GLUCOSE 160*   No results found for: CHOL, HDL, LDLCALC, TRIG Lab Results  Component Value Date   DDIMER 0.55* 12/19/2014     Radiology/Studies  Dg Chest Port 1 View  12/19/2014   CLINICAL DATA:  Mid chest pain and shortness of breath.  EXAM: PORTABLE CHEST - 1 VIEW  COMPARISON:  03/18/2012  FINDINGS: Mild cardiac enlargement  and diffuse pulmonary vascular congestion. Slight interstitial change in the lung bases suggest early edema. No focal consolidation. No blunting of costophrenic angles. No pneumothorax. Calcified and tortuous aorta. Surgical clips in the left axilla.  IMPRESSION: Cardiac enlargement with mild pulmonary vascular congestion edema.   Electronically Signed   By: Burman Nieves M.D.   On: 12/19/2014 01:02    ECG  AF, LBBB  ASSESSMENT AND PLAN  89F with pAF, HTB, HLD, OSA, HFpEF, LBBB, pHTN who presents with acute onset chest pain in the setting of an AF recurrence. CT angiogram has ruled out PE and an acute aortic syndrome. Given the risk factor profile and most parts of the story, the presentation is concerning for ACS. She inconsistently reported a pleuritic component to the CP, which I am not sure what to make of. At this point, it is prudent to treat for ACS. Given the chest pain has been difficult to control, it is reasonable to consider early cath.   Chest pain/ACS 1. Heparin gtt (for ACS and AF) 2. Continue metoprolol, fractionated into 25mg  qh 3. Start high dose statin 4. Continue hctz/valsartan combo and lasix 5. Cycle troponins 6. NPO for possible cardiac cath 7. Lipids, A1c  AF. CHADSVASC of 4 (CHF, HTN, Age, Female) 1. Rate control with metoprolol 2. Consider DCCV after cardiac cath 3. Continue UFH for [facility]; she should be considered for [facility] at discharge given risk for stroke.   Neva Seat, MD 12/19/2014, 4:33 AM

## 2014-12-19 NOTE — Progress Notes (Signed)
ANTICOAGULATION CONSULT NOTE - Initial Consult  Pharmacy Consult for Heparin Indication: chest pain/ACS/r/o PE  Allergies  Allergen Reactions  . Codeine Palpitations  . Ace Inhibitors Cough    Patient Measurements: Height: 5\' 5"  (165.1 cm) Weight: 282 lb (127.914 kg) IBW/kg (Calculated) : 57 Heparin Dosing Weight: 90 kg  Vital Signs: BP: 106/40 mmHg (01/27 0330) Pulse Rate: 55 (01/27 0330)  Labs:  Recent Labs  12/19/14 0057  HGB 12.8  HCT 36.5  PLT 191  APTT 56*  LABPROT 13.6  INR 1.03  CREATININE 1.23*  TROPONINI <0.03    Estimated Creatinine Clearance: 56.6 mL/min (by C-G formula based on Cr of 1.23).   Medical History: Past Medical History  Diagnosis Date  . Atrial fibrillation   . Essential hypertension, benign   . Mixed hyperlipidemia   . Hypothyroidism   . Depression   . Morbid obesity   . Lumbar disc disease   . GERD (gastroesophageal reflux disease)   . Nonalcoholic fatty liver disease   . History of breast cancer   . Meralgia paresthetica, right   . Left bundle branch block     negative Lexiscan Myoview; EF 56%, 3/13   . Diastolic heart failure     LVEF 55-60%  . Pulmonary hypertension     RVSP 55-60 mm mercury  . Hx of atrial fibrillation, no current medication     converted out of A. Fib with cardioversion  . OSA (obstructive sleep apnea)     CPAP machine    Medications:  Celexa  Toprol XL  Xanax  ASA  Allegra  Lasix  Neurontin  Synthroid  MVI  Prilosec  KCl  Ultram  Diovan HCT  Assessment: 74 y.o. female with chest pain, posiible PE vs ACS,  for heparin  Goal of Therapy:  Heparin level 0.3-0.7 units/ml Monitor platelets by anticoagulation protocol: Yes   Plan:  Heparin 4000 units IV bolus, then start heparin 1350 units/hr Check heparin level in 8 hours.  Eddie Candle 12/19/2014,4:19 AM

## 2014-12-19 NOTE — Progress Notes (Signed)
The cath lab was not able to get Leslie Alexander worked into the schedule. Will reschedule for tomorrow   Philip J. Nahser, Jr., MD, FACC 12/19/2014, 2:52 PM [address],  [address] Office - [phone] Pager [phone]  

## 2014-12-19 NOTE — Progress Notes (Signed)
  Echocardiogram 2D Echocardiogram has been performed.  [name] 12/19/2014, 11:23 AM

## 2014-12-19 NOTE — Progress Notes (Addendum)
PROGRESS NOTE  Subjective:   Ms. Foulkes is a 74 yo , previoius patient of Dr. Andee Lineman, who presents with symptoms that are c/w unstable angina.   CP, started last night. Lasted 30 minutes.  Eventually was relieved in the ER with IV morphine and NTG.  CP radiated out to her back - intrascapular region   Had more CP this am , relieved with increasing her NTG drip. She converted to NSR overnight  Objective:    Vital Signs:   Temp:  [98.9 F (37.2 C)] 98.9 F (37.2 C) (01/27 0600) Pulse Rate:  [50-116] 52 (01/27 0825) Resp:  [10-26] 13 (01/27 0825) BP: (89-113)/(33-94) 94/48 mmHg (01/27 0825) SpO2:  [94 %-100 %] 100 % (01/27 0825) Weight:  [282 lb (127.914 kg)-298 lb 1 oz (135.2 kg)] 298 lb 1 oz (135.2 kg) (01/27 0500)      24-hour weight change: Weight change:   Weight trends: Filed Weights   12/19/14 0102 12/19/14 0500  Weight: 282 lb (127.914 kg) 298 lb 1 oz (135.2 kg)    Intake/Output:  01/26 0701 - 01/27 0700 In: 15 [I.V.:15] Out: -      Physical Exam: BP 94/48 mmHg  Pulse 52  Temp(Src) 98.9 F (37.2 C)  Resp 13  Ht 5\' 5"  (1.651 m)  Wt 298 lb 1 oz (135.2 kg)  BMI 49.60 kg/m2  SpO2 100%  Wt Readings from Last 3 Encounters:  12/19/14 298 lb 1 oz (135.2 kg)  12/18/13 311 lb (141.069 kg)  12/15/13 311 lb (141.069 kg)    General: Vital signs reviewed and noted.   Head: Normocephalic, atraumatic.  Eyes: conjunctivae/corneas clear.  EOM's intact.   Throat: normal  Neck:  normal   Lungs:    clear   Heart:   RR, normal S1S2  Abdomen:  Soft, non-tender, non-distended    Extremities: No edema. Good pulses.    Neurologic: A&O X3, CN II - XII are grossly intact.   Psych: Normal     Labs: BMET:  Recent Labs  12/19/14 0057 12/19/14 0645  NA 134* 134*  K 3.3* 3.8  CL 101 102  CO2 22 25  GLUCOSE 160* 155*  BUN 26* 24*  CREATININE 1.23* 1.14*  CALCIUM 9.1 8.3*    Liver function tests: No results for input(s): AST, ALT, ALKPHOS, BILITOT,  PROT, ALBUMIN in the last 72 hours. No results for input(s): LIPASE, AMYLASE in the last 72 hours.  CBC:  Recent Labs  12/19/14 0057 12/19/14 0645  WBC 7.1 5.0  NEUTROABS 3.6  --   HGB 12.8 11.2*  HCT 36.5 32.8*  MCV 87.5 87.5  PLT 191 175    Cardiac Enzymes:  Recent Labs  12/19/14 0057 12/19/14 0644  TROPONINI <0.03 <0.03    Coagulation Studies:  Recent Labs  12/19/14 0057 12/19/14 0645  LABPROT 13.6 14.2  INR 1.03 1.09    Other: Invalid input(s): POCBNP  Recent Labs  12/19/14 0057  DDIMER 0.55*   No results for input(s): HGBA1C in the last 72 hours.  Recent Labs  12/19/14 0645  CHOL 190  HDL 40  LDLCALC 135*  TRIG 74  CHOLHDL 4.8   No results for input(s): TSH, T4TOTAL, T3FREE, THYROIDAB in the last 72 hours.  Invalid input(s): FREET3 No results for input(s): VITAMINB12, FOLATE, FERRITIN, TIBC, IRON, RETICCTPCT in the last 72 hours.   Other results:  EKG :   LBBB   Medications:    Infusions: . heparin 1,350 Units/hr (  12/19/14 0600)  . nitroGLYCERIN 20 mcg/min (12/19/14 0825)    Scheduled Medications: . [START ON 12/20/2014] aspirin EC  81 mg Oral Daily  . furosemide  40 mg Oral BID  . gabapentin  300 mg Oral TID  . irbesartan  75 mg Oral Daily   And  . hydrochlorothiazide  12.5 mg Oral Daily  . levothyroxine  100 mcg Oral QAC breakfast  . loratadine  10 mg Oral Daily  . metoprolol tartrate  25 mg Oral 4 times per day  . morphine      . nitroGLYCERIN      . pantoprazole  80 mg Oral Daily  . potassium chloride SA  20 mEq Oral Daily  . traMADol  100 mg Oral BID   And  . traMADol  50 mg Oral Q24H    Assessment/ Plan:     1.   Unstable angina:  Ms. Bernath presents with symptoms of unstable angina.  Has never had these symptoms previously.  She has some chest wall tenderness but she states that this is not exactly the same pain.   Has a LBBB.   Will schedule her for cath.  I discussed the risks, benefits, options with her and  family.  They understand and agree to proceed.   2. Paroxysmal atrial fib:  She has converted to NSR.  CHADS 2 VASC score of at lease 4( woman, age 47, HTN, HFpEF)  Will get an echo for further assessment.  She will need to be started on a  NOAC.    3. Essential HTN:  Continue current meds.   4. LBBB - will get an echo.   Disposition: for cath today.   Length of Stay: 0  Vesta Mixer, Montez Hageman., MD, [facility] 12/19/2014, 10:27 AM Office [phone] Pager [phone]

## 2014-12-19 NOTE — Progress Notes (Signed)
ANTICOAGULATION CONSULT NOTE   Pharmacy Consult for Heparin Indication: chest pain/ACS/r/o PE  Allergies  Allergen Reactions  . Codeine Palpitations  . Ace Inhibitors Cough    Patient Measurements: Height: 5\' 5"  (165.1 cm) Weight: 298 lb 1 oz (135.2 kg) IBW/kg (Calculated) : 57 Heparin Dosing Weight: 90 kg  Vital Signs: Temp: 97.7 F (36.5 C) (01/27 2107) Temp Source: Oral (01/27 2107) BP: 104/27 mmHg (01/27 1817) Pulse Rate: 51 (01/27 1113)  Labs:  Recent Labs  12/19/14 0057 12/19/14 0644 12/19/14 0645 12/19/14 1126 12/19/14 1127 12/19/14 1830 12/19/14 2020  HGB 12.8  --  11.2*  --   --   --   --   HCT 36.5  --  32.8*  --   --   --   --   PLT 191  --  175  --   --   --   --   APTT 56*  --   --   --   --   --   --   LABPROT 13.6  --  14.2  --   --   --   --   INR 1.03  --  1.09  --   --   --   --   HEPARINUNFRC  --   --   --  0.45  --   --  0.45  CREATININE 1.23*  --  1.14*  --   --   --   --   TROPONINI <0.03 <0.03  --   --  <0.03 <0.03  --     Estimated Creatinine Clearance: 63.1 mL/min (by C-G formula based on Cr of 1.14).   Medical History: Past Medical History  Diagnosis Date  . Atrial fibrillation   . Essential hypertension, benign   . Mixed hyperlipidemia   . Hypothyroidism   . Depression   . Morbid obesity   . Lumbar disc disease   . GERD (gastroesophageal reflux disease)   . Nonalcoholic fatty liver disease   . History of breast cancer   . Meralgia paresthetica, right   . Left bundle branch block     negative Lexiscan Myoview; EF 56%, 3/13   . Diastolic heart failure     LVEF 55-60%  . Pulmonary hypertension     RVSP 55-60 mm mercury  . Hx of atrial fibrillation, no current medication     converted out of A. Fib with cardioversion  . OSA (obstructive sleep apnea)     CPAP machine    Medications:  Celexa  Toprol XL  Xanax  ASA  Allegra  Lasix  Neurontin  Synthroid  MVI  Prilosec  KCl  Ultram  Diovan HCT  Assessment: 74 y.o. female  with chest pain, noted with [place] and paroxysmal atrial fib on heparin. Heparin level remains at goal (HL= 0.45). She is noted for cath on 12/20/14 with plans for possible oral anticoagulation.   Goal of Therapy:  Heparin level 0.3-0.7 units/ml Monitor platelets by anticoagulation protocol: Yes   Plan:   -No heparin changes needed -Daily heparin level and CBC  Harland German, Pharm D 12/19/2014 10:32 PM

## 2014-12-19 NOTE — ED Notes (Signed)
Family at bedside. 

## 2014-12-20 ENCOUNTER — Encounter (HOSPITAL_COMMUNITY)
Admission: EM | Disposition: A | Discharge status: Home / Self Care | Payer: Self-pay | Source: Home / Self Care | Attending: Cardiology

## 2014-12-20 ENCOUNTER — Encounter (HOSPITAL_COMMUNITY): Payer: Self-pay | Admitting: Cardiology

## 2014-12-20 DIAGNOSIS — I447 Left bundle-branch block, unspecified: Secondary | ICD-10-CM

## 2014-12-20 DIAGNOSIS — I480 Paroxysmal atrial fibrillation: Principal | ICD-10-CM

## 2014-12-20 DIAGNOSIS — R079 Chest pain, unspecified: Secondary | ICD-10-CM | POA: Insufficient documentation

## 2014-12-20 HISTORY — PX: LEFT HEART CATHETERIZATION WITH CORONARY ANGIOGRAM: SHX5451

## 2014-12-20 LAB — CBC
HEMATOCRIT: 35.5 % — AB (ref 36.0–46.0)
Hemoglobin: 11.8 g/dL — ABNORMAL LOW (ref 12.0–15.0)
MCH: 29.6 pg (ref 26.0–34.0)
MCHC: 33.2 g/dL (ref 30.0–36.0)
MCV: 89.2 fL (ref 78.0–100.0)
Platelets: 176 10*3/uL (ref 150–400)
RBC: 3.98 MIL/uL (ref 3.87–5.11)
RDW: 13.9 % (ref 11.5–15.5)
WBC: 4.6 10*3/uL (ref 4.0–10.5)

## 2014-12-20 LAB — MRSA PCR SCREENING: MRSA BY PCR: NEGATIVE

## 2014-12-20 LAB — HEPARIN LEVEL (UNFRACTIONATED): HEPARIN UNFRACTIONATED: 0.36 [IU]/mL (ref 0.30–0.70)

## 2014-12-20 SURGERY — LEFT HEART CATHETERIZATION WITH CORONARY ANGIOGRAM

## 2014-12-20 MED ORDER — ASPIRIN 81 MG PO CHEW
CHEWABLE_TABLET | ORAL | Status: AC
  Filled 2014-12-20: qty 1

## 2014-12-20 MED ORDER — LIDOCAINE HCL (PF) 1 % IJ SOLN
INTRAMUSCULAR | Status: AC
  Filled 2014-12-20: qty 30

## 2014-12-20 MED ORDER — FENTANYL CITRATE 0.05 MG/ML IJ SOLN
INTRAMUSCULAR | Status: AC
  Filled 2014-12-20: qty 2

## 2014-12-20 MED ORDER — SODIUM CHLORIDE 0.9 % IV SOLN
1.0000 mL/kg/h | INTRAVENOUS | Status: AC
  Administered 2014-12-20: 1 mL/kg/h via INTRAVENOUS

## 2014-12-20 MED ORDER — MIDAZOLAM HCL 2 MG/2ML IJ SOLN
INTRAMUSCULAR | Status: AC
  Filled 2014-12-20: qty 2

## 2014-12-20 MED ORDER — ASPIRIN 81 MG PO CHEW
81.0000 mg | CHEWABLE_TABLET | Freq: Once | ORAL | Status: AC
  Administered 2014-12-20: 81 mg via ORAL

## 2014-12-20 MED ORDER — POTASSIUM CHLORIDE CRYS ER 20 MEQ PO TBCR: 20.0000 meq | EXTENDED_RELEASE_TABLET | Freq: Every day | ORAL | Status: DC

## 2014-12-20 MED ORDER — NITROGLYCERIN 0.4 MG SL SUBL: 0.4000 mg | SUBLINGUAL_TABLET | SUBLINGUAL | Status: DC | PRN

## 2014-12-20 MED ORDER — APIXABAN 5 MG PO TABS: 5.0000 mg | ORAL_TABLET | Freq: Two times a day (BID) | ORAL | Status: DC

## 2014-12-20 MED ORDER — HEPARIN (PORCINE) IN NACL 2-0.9 UNIT/ML-% IJ SOLN
INTRAMUSCULAR | Status: AC
Start: 2014-12-20 — End: 2014-12-20
  Filled 2014-12-20: qty 1000

## 2014-12-20 MED ORDER — VERAPAMIL HCL 2.5 MG/ML IV SOLN
INTRAVENOUS | Status: AC
  Filled 2014-12-20: qty 2

## 2014-12-20 MED ORDER — FUROSEMIDE 40 MG PO TABS: 40.0000 mg | ORAL_TABLET | Freq: Two times a day (BID) | ORAL | Status: DC

## 2014-12-20 MED ORDER — HEPARIN SODIUM (PORCINE) 1000 UNIT/ML IJ SOLN
INTRAMUSCULAR | Status: AC
  Filled 2014-12-20: qty 1

## 2014-12-20 NOTE — Interval H&P Note (Signed)
History and Physical Interval Note:  12/20/2014 7:43 AM  Leslie Alexander  has presented today for surgery, with the diagnosis of cp  The various methods of treatment have been discussed with the patient and family. After consideration of risks, benefits and other options for treatment, the patient has consented to  Procedure(s): LEFT HEART CATHETERIZATION WITH CORONARY ANGIOGRAM (N/A) as a surgical intervention .  The patient's history has been reviewed, patient examined, no change in status, stable for surgery.  I have reviewed the patient's chart and labs.  Questions were answered to the patient's satisfaction.   Cath Lab Visit (complete for each Cath Lab visit)  Clinical Evaluation Leading to the Procedure:   ACS: Yes.    Non-ACS:    Anginal Classification: CCS IV  Anti-ischemic medical therapy: Minimal Therapy (1 class of medications)  Non-Invasive Test Results: No non-invasive testing performed  Prior CABG: No previous CABG        Theron Arista [facility] 12/20/2014 7:43 AM

## 2014-12-20 NOTE — Progress Notes (Signed)
PROGRESS NOTE  Subjective:   Leslie Alexander is a 74 yo , previoius patient of Dr. Andee Lineman, who presents with symptoms that are c/w unstable angina.   CP, started last night. Lasted 30 minutes.  Eventually was relieved in the ER with IV morphine and NTG.  CP radiated out to her back - intrascapular region    Had more CP this am , relieved with increasing her NTG drip. She converted to NSR overnight  Objective:    Vital Signs:   Temp:  [97.5 F (36.4 C)-98.4 F (36.9 C)] 98.4 F (36.9 C) (01/28 0831) Pulse Rate:  [51-64] 56 (01/28 0831) Resp:  [11-23] 11 (01/28 0831) BP: (93-162)/(27-113) 162/113 mmHg (01/28 0930) SpO2:  [95 %-100 %] 100 % (01/28 0831) Weight:  [298 lb 1 oz (135.2 kg)] 298 lb 1 oz (135.2 kg) (01/27 1101)      24-hour weight change: Weight change: 16 lb 1 oz (7.286 kg)  Weight trends: Filed Weights   12/19/14 0102 12/19/14 0500 12/19/14 1101  Weight: 282 lb (127.914 kg) 298 lb 1 oz (135.2 kg) 298 lb 1 oz (135.2 kg)    Intake/Output:  01/27 0701 - 01/28 0700 In: 1648.3 [P.O.:840; I.V.:808.3] Out: 2325 [Urine:2325] Total I/O In: 315 [P.O.:240; I.V.:75] Out: -    Physical Exam: BP 162/113 mmHg  Pulse 56  Temp(Src) 98.4 F (36.9 C) (Oral)  Resp 11  Ht  (1.651 m)  Wt 298 lb 1 oz (135.2 kg)  BMI 49.60 kg/m2  SpO2 100%  Wt Readings from Last 3 Encounters:  12/19/14 298 lb 1 oz (135.2 kg)  12/18/13 311 lb (141.069 kg)  12/15/13 311 lb (141.069 kg)    General: Vital signs reviewed and noted.   Head: Normocephalic, atraumatic.  Eyes: conjunctivae/corneas clear.  EOM's intact.   Throat: normal  Neck:  normal   Lungs:    clear   Heart:   RR, normal S1S2  Abdomen:  Soft, non-tender, non-distended    Extremities: No edema.. TR band is in place    Neurologic: A&O X3, CN II - XII are grossly intact.   Psych: Normal     Labs: BMET:  Recent Labs  12/19/14 0057 12/19/14 0645  NA 134* 134*  K 3.3* 3.8  CL 101 102  CO2 22 25    GLUCOSE 160* 155*  BUN 26* 24*  CREATININE 1.23* 1.14*  CALCIUM 9.1 8.3*    Liver function tests: No results for input(s): AST, ALT, ALKPHOS, BILITOT, PROT, ALBUMIN in the last 72 hours. No results for input(s): LIPASE, AMYLASE in the last 72 hours.  CBC:  Recent Labs  12/19/14 0057 12/19/14 0645 12/20/14 0417  WBC 7.1 5.0 4.6  NEUTROABS 3.6  --   --   HGB 12.8 11.2* 11.8*  HCT 36.5 32.8* 35.5*  MCV 87.5 87.5 89.2  PLT 191 175 176    Cardiac Enzymes:  Recent Labs  12/19/14 0057 12/19/14 0644 12/19/14 1127 12/19/14 1830  TROPONINI <0.03 <0.03 <0.03 <0.03    Coagulation Studies:  Recent Labs  12/19/14 0057 12/19/14 0645  LABPROT 13.6 14.2  INR 1.03 1.09    Other: Invalid input(s): POCBNP  Recent Labs  12/19/14 0057  DDIMER 0.55*    Recent Labs  12/19/14 0645  HGBA1C 6.5*    Recent Labs  12/19/14 0645  CHOL 190  HDL 40  LDLCALC 135*  TRIG 74  CHOLHDL 4.8   No results for input(s): TSH, T4TOTAL, T3FREE, THYROIDAB in  the last 72 hours.  Invalid input(s): FREET3 No results for input(s): VITAMINB12, FOLATE, FERRITIN, TIBC, IRON, RETICCTPCT in the last 72 hours.   Other results:  EKG :   LBBB   Medications:    Infusions: . sodium chloride 1 mL/kg/hr (12/20/14 0921)    Scheduled Medications: . aspirin EC  81 mg Oral Daily  . furosemide  40 mg Oral BID  . gabapentin  300 mg Oral TID  . irbesartan  75 mg Oral Daily   And  . hydrochlorothiazide  12.5 mg Oral Daily  . levothyroxine  100 mcg Oral QAC breakfast  . loratadine  10 mg Oral Daily  . metoprolol tartrate  25 mg Oral 4 times per day  . pantoprazole  80 mg Oral Daily  . pneumococcal 23 valent vaccine  0.5 mL Intramuscular Tomorrow-1000  . potassium chloride SA  20 mEq Oral Daily  . traMADol  100 mg Oral BID   And  . traMADol  50 mg Oral Q24H    Assessment/ Plan:     1.    Chest pain:  Her cath this am showed no significant coronary lesions.   2. Paroxysmal atrial  fib:  She has converted to NSR.  CHADS 2 VASC score of at lease 4( woman, age 63, HTN, HFpEF)     Echo shows EF of 50-55%.   Will start Eliquis  5 BID tomorrow.   3. Essential HTN:  Continue current meds.   4. LBBB - will get an echo.  Disposition: anticipate DC today .   Length of Stay: 1  Vesta Mixer, Montez Hageman., MD, [facility] 12/20/2014, 10:25 AM Office [phone] Pager [phone]

## 2014-12-20 NOTE — H&P (View-Only) (Signed)
The cath lab was not able to get [name] worked into the schedule. Will reschedule for tomorrow   Leslie Alexander., Leslie Alexander, [facility] 12/19/2014, 2:52 PM [address],  [address] Office [phone] Pager [phone]

## 2014-12-20 NOTE — CV Procedure (Signed)
    Cardiac Catheterization Procedure Note  Name: Leslie Alexander MRN: [id number] DOB: 01/05/1941  Procedure: Left Heart Cath, Selective Coronary Angiography, LV angiography  Indication: 74 yo WF with paroxysmal atrial fibrillation presents with chest pain concerning for unstable angina. She has a LBBB.   Procedural Details: The right wrist was prepped, draped, and anesthetized with 1% lidocaine. Using the modified Seldinger technique, a 6 French slender sheath was introduced into the right radial artery. 3 mg of verapamil was administered through the sheath, weight-based unfractionated heparin was administered intravenously. Standard Judkins catheters were used for selective coronary angiography and left ventriculography. Catheter exchanges were performed over an exchange length guidewire. There were no immediate procedural complications. A TR band was used for radial hemostasis at the completion of the procedure.  The patient was transferred to the post catheterization recovery area for further monitoring.  Procedural Findings: Hemodynamics: AO 118/52 mean 72 mm Hg LV 120/13 mm Hg   Coronary angiography: Coronary dominance: right  Left mainstem: Normal  Left anterior descending (LAD): minor wall irregularities less than 10%  Left circumflex (LCx): Mild disease in the mid vessel up to 20%.   Right coronary artery (RCA): Diffuse disease in the mid vessel up to 30%.   Left ventriculography: Left ventricular systolic function is normal, LVEF is estimated at 55-65%, there is no significant mitral regurgitation   Final Conclusions:   1. Mild nonobstructive CAD 2. Normal LV function.  Recommendations: Based on these results I think her chest pain was related more to her atrial fibrillation. Will DC IV Ntg and ambulate today. May be stable for DC later today.  Tannon Peerson [place], MDFACC  12/20/2014, 8:13 AM

## 2014-12-20 NOTE — Discharge Instructions (Addendum)
Information on my medicine - ELIQUIS (apixaban)  This medication education was reviewed with me or my healthcare representative as part of my discharge preparation.  The pharmacist that spoke with me during my hospital stay was:  Severiano Gilbert, [facility]  Why was Eliquis prescribed for you? Eliquis was prescribed for you to reduce the risk of a blood clot forming that can cause a stroke if you have a medical condition called atrial fibrillation (a type of irregular heartbeat).  What do You need to know about Eliquis ? Take your Eliquis TWICE DAILY - one tablet in the morning and one tablet in the evening with or without food. If you have difficulty swallowing the tablet whole please discuss with your pharmacist how to take the medication safely.  Take Eliquis exactly as prescribed by your doctor and DO NOT stop taking Eliquis without talking to the doctor who prescribed the medication.  Stopping may increase your risk of developing a stroke.  Refill your prescription before you run out.  After discharge, you should have regular check-up appointments with your healthcare provider that is prescribing your Eliquis.  In the future your dose may need to be changed if your kidney function or weight changes by a significant amount or as you get older.  What do you do if you miss a dose? If you miss a dose, take it as soon as you remember on the same day and resume taking twice daily.  Do not take more than one dose of ELIQUIS at the same time to make up a missed dose.  Important Safety Information A possible side effect of Eliquis is bleeding. You should call your healthcare provider right away if you experience any of the following: ? Bleeding from an injury or your nose that does not stop. ? Unusual colored urine (red or dark brown) or unusual colored stools (red or black). ? Unusual bruising for unknown reasons. ? A serious fall or if you hit your head (even if there is no  bleeding).  Some medicines may interact with Eliquis and might increase your risk of bleeding or clotting while on Eliquis. To help avoid this, consult your healthcare provider or pharmacist prior to using any new prescription or non-prescription medications, including herbals, vitamins, non-steroidal anti-inflammatory drugs (NSAIDs) and supplements.  This website has more information on Eliquis (apixaban): http://www.eliquis.com/eliquis/home  No driving for 24 hours. No lifting over 5 lbs for 1 week. No sexual activity for 1 week. Keep procedure site clean & dry. If you notice increased pain, swelling, bleeding or pus, call/return!  You may shower, but no soaking baths/hot tubs/pools for 1 week.

## 2014-12-20 NOTE — Discharge Summary (Signed)
Discharge Summary   Patient ID: Leslie Alexander,  MRN: [id number], DOB/AGE: [birth year] 74 y.o.  Admit date: 12/19/2014 Discharge date: 12/20/2014  Primary Care Provider: Donzetta Sprung Primary Cardiologist: New (previous Dr. Earnestine Leys in [place])  Discharge Diagnoses Principal Problem:   Chest pain Active Problems:   PAF (paroxysmal atrial fibrillation)   Essential hypertension   LBBB (left bundle branch block)   Pain in the chest   Allergies Allergies  Allergen Reactions  . Codeine Palpitations  . Ace Inhibitors Cough    Procedures  Echocardiogram 12/19/2014 LV EF: 50% -  55%  ------------------------------------------------------------------- Indications:   Chest pain 786.51.  ------------------------------------------------------------------- History:  PMH: Unstable angina. Left bundle branch block. Atrial fibrillation. Risk factors: Dyslipidemia.  ------------------------------------------------------------------- Study Conclusions  - Left ventricle: The cavity size was normal. There was mild concentric hypertrophy. Systolic function was normal. The estimated ejection fraction was in the range of 50% to 55%. Images were inadequate for LV wall motion assessment. - Ventricular septum: Septal motion showed paradox. - Aortic valve: Mild thickening and calcification, consistent with sclerosis. - Mitral valve: There was mild regurgitation. - Left atrium: The atrium was mildly dilated. - Pulmonary arteries: PA peak pressure: 34 mm Hg (S). - Recommendations: Endocardium poorly visualized, although overall EF appears preserved. Recommend limited echo with definity contrast to assess wall motion.     Cardiac catheterization 12/20/2014 Procedural Findings: Hemodynamics: AO 118/52 mean 72 mm Hg LV 120/13 mm Hg   Coronary angiography: Coronary dominance: right  Left mainstem: Normal  Left anterior descending (LAD): minor wall irregularities  less than 10%  Left circumflex (LCx): Mild disease in the mid vessel up to 20%.   Right coronary artery (RCA): Diffuse disease in the mid vessel up to 30%.   Left ventriculography: Left ventricular systolic function is normal, LVEF is estimated at 55-65%, there is no significant mitral regurgitation   Final Conclusions:  1. Mild nonobstructive CAD 2. Normal LV function.  Recommendations: Based on these results I think her chest pain was related more to her atrial fibrillation. Will DC IV Ntg and ambulate today. May be stable for DC later today.    Hospital Course  The patient is a 74 year old female with history of hypertension, hyperlipidemia, obstructive sleep apnea, left bundle branch block, and history of paroxysmal atrial fibrillation who presented with acute onset of chest pain on 12/19/2014. She was transferred to [facility] ED for initial evaluation and was found to have recurrent controlled atrial fibrillation with left bundle branch block. Dr. Allyson Sabal was initially called for STEMI activation, this was canceled after compare with the previous EKG. She was placed on IV heparin and transferred to [facility] for further reevaluation. Chest x-ray demonstrated cardiomegaly with mild pulmonary vascular congestion. Initially after finding was notable for potassium of 3.3, creatinine of 1.23, BNP 147, troponin negative, d-dimer 0.55. Chest CTA was negative for acute PE or aortic dissection.   Her symptom was felt to be likely unstable angina. She was placed on nitroglycerin drip. She converted to normal sinus rhythm overnight spontaneously. Echo obtained on 1/27 showed EF 50-55%. She underwent cardiac catheterization on 12/20/2014 which showed mild nonobstructive CAD, normal LV function with EF 55-65%. Her chest pain was attributed to her atrial fibrillation. IV nitroglycerin was discontinued. She was deemed stable for discharge from cardiology perspective. Given her CHADS 2 VASC score of  at lease 4( woman, age 43, HTN, HFpEF), she will be started on eliquis  BID. Patient has been instructed not to lift  anything greater than 5 pounds for one week. I have schedule followup appointment with our [facility] office in 2-4 weeks.  Of note, she has been on ASA during this hospitalization, this will need to be reviewed on followup whether she should continue on this medication along with Eliquis. If she has no recurrent CP, ASA should likely be discontinued.   Discharge Vitals Blood pressure 154/38, pulse 63, temperature 98.3 F (36.8 C), temperature source Oral, resp. rate 17, height 5\' 5"  (1.651 m), weight 298 lb 1 oz (135.2 kg), SpO2 98 %.  Filed Weights   12/19/14 0102 12/19/14 0500 12/19/14 1101  Weight: 282 lb (127.914 kg) 298 lb 1 oz (135.2 kg) 298 lb 1 oz (135.2 kg)    Labs  CBC  Recent Labs  12/19/14 0057 12/19/14 0645 12/20/14 0417  WBC 7.1 5.0 4.6  NEUTROABS 3.6  --   --   HGB 12.8 11.2* 11.8*  HCT 36.5 32.8* 35.5*  MCV 87.5 87.5 89.2  PLT 191 175 176   Basic Metabolic Panel  Recent Labs  12/19/14 0057 12/19/14 0645  NA 134* 134*  K 3.3* 3.8  CL 101 102  CO2 22 25  GLUCOSE 160* 155*  BUN 26* 24*  CREATININE 1.23* 1.14*  CALCIUM 9.1 8.3*   Cardiac Enzymes  Recent Labs  12/19/14 0644 12/19/14 1127 12/19/14 1830  TROPONINI <0.03 <0.03 <0.03   BNP Invalid input(s): POCBNP D-Dimer  Recent Labs  12/19/14 0057  DDIMER 0.55*   Hemoglobin A1C  Recent Labs  12/19/14 0645  HGBA1C 6.5*   Fasting Lipid Panel  Recent Labs  12/19/14 0645  CHOL 190  HDL 40  LDLCALC 135*  TRIG 74  CHOLHDL 4.8    Disposition  Pt is being discharged home today in good condition.  Follow-up Plans & Appointments      Follow-up Information    Follow up with Laqueta Linden, MD On 01/08/2015.   Specialty:  Cardiology   Why:  10:40am   Contact information:   [address] [place] [address] [phone]       Discharge  Medications    Medication List    TAKE these medications        apixaban 5 MG Tabs tablet  Commonly known as:  ELIQUIS  Take 1 tablet (5 mg total) by mouth 2 (two) times daily.  Start taking on:  12/21/2014     aspirin EC 81 MG tablet  Take 81 mg by mouth daily.     beta carotene w/minerals tablet  Take 1 tablet by mouth daily.     multivitamins ther. w/minerals Tabs tablet  Take 1 tablet by mouth daily.     CALCIUM + D PO  Take 1 tablet by mouth daily.     fexofenadine 180 MG tablet  Commonly known as:  ALLEGRA  Take 180 mg by mouth daily as needed for allergies.     furosemide 40 MG tablet  Commonly known as:  LASIX  Take 1 tablet (40 mg total) by mouth 2 (two) times daily.     gabapentin 300 MG capsule  Commonly known as:  NEURONTIN  Take 300 mg by mouth 3 (three) times daily.     levothyroxine 100 MCG tablet  Commonly known as:  SYNTHROID, LEVOTHROID  Take 100 mcg by mouth daily.     metoprolol succinate 100 MG 24 hr tablet  Commonly known as:  TOPROL-XL  Take 100 mg by mouth daily. Take with  or immediately following a meal.     nitroGLYCERIN 0.4 MG SL tablet  Commonly known as:  NITROSTAT  Place 1 tablet (0.4 mg total) under the tongue every 5 (five) minutes x 3 doses as needed for chest pain.     omeprazole 20 MG capsule  Commonly known as:  PRILOSEC  Take 20 mg by mouth daily.     OSTEO BI-FLEX JOINT SHIELD PO  Take 1 tablet by mouth daily.     potassium chloride SA 20 MEQ tablet  Commonly known as:  K-DUR,KLOR-CON  Take 1 tablet (20 mEq total) by mouth daily.     traMADol 50 MG tablet  Commonly known as:  ULTRAM  Take 50-100 mg by mouth 3 (three) times daily. 2 in the morning, 1 in afternoon, and 1 at bedtime.     valsartan-hydrochlorothiazide 80-12.5 MG per tablet  Commonly known as:  DIOVAN-HCT  Take 1 tablet by mouth daily.     VITAMIN B-12 PO  Take 1 tablet by mouth daily.        Duration of Discharge Encounter   Greater than 30  minutes including physician time.  Ramond Dial PA-C Pager: [phone] 12/20/2014, 4:44 PM   Attending Note:   The patient was seen and examined.  Agree with assessment and plan as noted above.  Changes made to the above note as needed.  See my note earlier today   Alvia Grove., MD, [facility] 12/20/2014, 5:15 PM [address],  [address] Office [phone] Pager [phone]

## 2015-01-08 ENCOUNTER — Ambulatory Visit: Payer: Medicare PPO | Admitting: Cardiovascular Disease

## 2015-01-15 ENCOUNTER — Ambulatory Visit (INDEPENDENT_AMBULATORY_CARE_PROVIDER_SITE_OTHER): Payer: Medicare PPO | Admitting: Cardiovascular Disease

## 2015-01-15 ENCOUNTER — Encounter: Payer: Self-pay | Admitting: Cardiovascular Disease

## 2015-01-15 VITALS — BP 116/62 | HR 54 | Ht 65.5 in | Wt 299.0 lb

## 2015-01-15 DIAGNOSIS — I480 Paroxysmal atrial fibrillation: Secondary | ICD-10-CM | POA: Diagnosis not present

## 2015-01-15 DIAGNOSIS — I10 Essential (primary) hypertension: Secondary | ICD-10-CM

## 2015-01-15 DIAGNOSIS — R002 Palpitations: Secondary | ICD-10-CM

## 2015-01-15 DIAGNOSIS — R079 Chest pain, unspecified: Secondary | ICD-10-CM

## 2015-01-15 DIAGNOSIS — R55 Syncope and collapse: Secondary | ICD-10-CM

## 2015-01-15 MED ORDER — METOPROLOL SUCCINATE ER 50 MG PO TB24: 50.0000 mg | ORAL_TABLET | Freq: Every day | ORAL | Status: DC

## 2015-01-15 NOTE — Progress Notes (Signed)
Patient ID: Leslie Alexander, female   DOB: 24-Nov-1940, 74 y.o.   MRN: [id number]      SUBJECTIVE: The patient is a 74 year old woman who I am meeting for the first time. She was recently hospitalized for chest pain deemed secondary to paroxysmal atrial fibrillation. An echocardiogram demonstrated normal left ventricular systolic function. She has an old left bundle branch block. She underwent cardiac catheterization which demonstrated mild nonobstructive coronary artery disease. CT angiogram demonstrated no evidence of pulmonary embolus or aortic dissection. She was discharged on Eliquis. She had not experienced any symptoms for the past 3 weeks until last night when she experienced palpitations. She said she almost passed out. She had some chest pain and took one sublingual nitroglycerin as well as a baby aspirin and symptoms resolved approximately 30 minutes later.   Review of Systems: As per "subjective", otherwise negative.  Allergies  Allergen Reactions  . Codeine Palpitations  . Ace Inhibitors Cough    Current Outpatient Prescriptions  Medication Sig Dispense Refill  . apixaban (ELIQUIS) 5 MG TABS tablet Take 1 tablet (5 mg total) by mouth 2 (two) times daily. 60 tablet 11  . aspirin EC 81 MG tablet Take 81 mg by mouth daily.    . beta carotene w/minerals (OCUVITE) tablet Take 1 tablet by mouth daily.    . Calcium Carbonate-Vitamin D (CALCIUM + D PO) Take 1 tablet by mouth daily.    . Cyanocobalamin (VITAMIN B-12 PO) Take 1 tablet by mouth daily.    . fexofenadine (ALLEGRA) 180 MG tablet Take 180 mg by mouth daily as needed for allergies.     . furosemide (LASIX) 40 MG tablet Take 1 tablet (40 mg total) by mouth 2 (two) times daily. 60 tablet 2  . gabapentin (NEURONTIN) 300 MG capsule Take 300 mg by mouth 3 (three) times daily.    Marland Kitchen levothyroxine (SYNTHROID, LEVOTHROID) 100 MCG tablet Take 100 mcg by mouth daily.    . metoprolol succinate (TOPROL-XL) 100 MG 24 hr tablet Take 100 mg  by mouth 2 (two) times daily. Take with or immediately following a meal.    . Misc Natural Products (OSTEO BI-FLEX JOINT SHIELD PO) Take 1 tablet by mouth daily.    . Multiple Vitamins-Minerals (MULTIVITAMINS THER. W/MINERALS) TABS tablet Take 1 tablet by mouth daily.    . nitroGLYCERIN (NITROSTAT) 0.4 MG SL tablet Place 1 tablet (0.4 mg total) under the tongue every 5 (five) minutes x 3 doses as needed for chest pain. 25 tablet 0  . omeprazole (PRILOSEC) 20 MG capsule Take 20 mg by mouth daily.    . potassium chloride SA (K-DUR,KLOR-CON) 20 MEQ tablet Take 1 tablet (20 mEq total) by mouth daily. 30 tablet 2  . traMADol (ULTRAM) 50 MG tablet Take 50-100 mg by mouth 3 (three) times daily. 2 in the morning, 1 in afternoon, and 1 at bedtime.    . valsartan-hydrochlorothiazide (DIOVAN-HCT) 80-12.5 MG per tablet Take 1 tablet by mouth daily.     No current facility-administered medications for this visit.    Past Medical History  Diagnosis Date  . Atrial fibrillation   . Essential hypertension, benign   . Mixed hyperlipidemia   . Hypothyroidism   . Depression   . Morbid obesity   . Lumbar disc disease   . GERD (gastroesophageal reflux disease)   . Nonalcoholic fatty liver disease   . History of breast cancer   . Meralgia paresthetica, right   . Left bundle branch block  negative Lexiscan Myoview; EF 56%, 3/13   . Diastolic heart failure     LVEF 55-60%  . Pulmonary hypertension     RVSP 55-60 mm mercury  . Hx of atrial fibrillation, no current medication     converted out of A. Fib with cardioversion  . OSA (obstructive sleep apnea)     CPAP machine    Past Surgical History  Procedure Laterality Date  . Tubal ligation      BILATERAL  . Laparoscopic cholecystectomy  03/02/2011    Dr. Gabriel Cirri  . Cataract extraction, bilateral  04/19/2008    Dr. Alto Denver  . Femoral exploration  12/2003    RIGHT LATERAL FEMORAL CUTANEOUS NERVE/Dr. Channing Mutters  . Knee arthroscopy  10/2005    Left  knee/from torn cartilage Dr. Edger House  . Heel spur surgery  07/2006    For spurs/Dr. Ulice Brilliant  . Pars plana vitrectomy w/ repair of macular hole  05/2008    Dr. Luciana Axe  . Total knee arthroplasty Left 12/29/2011    Dr. Chaney Malling  . Incision and drainage of wound  01/26/2012    Dr. Chaney Malling  . Mastectomy, radical Left 1989    With chemotherapy  . Cataract extraction w/phaco Left 12/18/2013    Procedure: CATARACT EXTRACTION PHACO AND INTRAOCULAR LENS PLACEMENT (IOC);  Surgeon: Gemma Payor, MD;  Location: [facility];  Service: Ophthalmology;  Laterality: Left;  CDE 12.70  . Left heart catheterization with coronary angiogram N/A 12/20/2014    Procedure: LEFT HEART CATHETERIZATION WITH CORONARY ANGIOGRAM;  Surgeon: Peter M [place], MD;  Location: [facility] CATH LAB;  Service: Cardiovascular;  Laterality: N/A;    History   Social History  . Marital Status: Married    Spouse Name: N/A  . Number of Children: N/A  . Years of Education: N/A   Occupational History  . Not on file.   Social History Main Topics  . Smoking status: Former Smoker -- 0.50 packs/day for 10 years    Types: Cigarettes    Start date: 06/03/1965    Quit date: 11/24/1987  . Smokeless tobacco: Never Used  . Alcohol Use: No  . Drug Use: No  . Sexual Activity: No   Other Topics Concern  . Not on file   Social History Narrative   Has 1 daughter     Ceasar Mons Vitals:   01/15/15 1556  BP: 116/62  Pulse: 54  Height: 5' 5.5" (1.664 m)  Weight: 299 lb (135.626 kg)  SpO2: 97%    PHYSICAL EXAM General: NAD HEENT: Normal. Neck: No JVD, no thyromegaly. Lungs: Clear to auscultation bilaterally with normal respiratory effort. CV: Nondisplaced PMI.  Regular rate and rhythm, normal S1/S2, no S3/S4, no murmur. No pretibial or periankle edema.  No carotid bruit.  Normal pedal pulses.  Abdomen: Soft, nontender, no hepatosplenomegaly, no distention.  Neurologic: Alert and oriented x 3.  Psych: Normal affect. Skin:  Normal. Musculoskeletal: Normal range of motion, no gross deformities. Extremities: No clubbing or cyanosis.   ECG: Most recent ECG reviewed.      ASSESSMENT AND PLAN: 1. Paroxysmal atrial fibrillation: Given her symptoms, I wonder if the palpitations represent a paroxysm of atrial fibrillation. She may need antiarrhythmic therapy versus consideration for an atrial fibrillation ablation. I will obtain a 30 day event monitor to assess the frequency of atrial fibrillation. Given her use of Eliquis which she requires, I will d/c ASA to minimize bleeding complications, and because there is no indication for this as she does not have heart disease.  I will also d/c SL nitroglycerin as she does not have hemodynamically significant ischemic heart disease.  2. Near syncope: This may have been due to bradycardia as her heart rate is 54 bpm on Toprol-XL 100 mg twice daily. I will reduce this to 50 mg twice daily.  3. Essential hypertension: Well controlled. Continue to monitor given reduction of Toprol-XL dose.  4. Chest pain: Likely secondary to atrial fibrillation. Will d/c SL nitroglycerin as she does not have hemodynamically significant ischemic heart disease.  Dispo: f/u 2 months.  Time spent: 40 minutes, of which greater than 50% was spent reviewing symptoms, relevant blood tests and studies, and discussing management plan with the patient.   Prentice Docker, M.D., F.A.C.C.

## 2015-01-15 NOTE — Patient Instructions (Signed)
Your physician recommends that you schedule a follow-up appointment in: after 30 day event monitor  Your physician has recommended that you wear an event monitor for 30 days. Event monitors are medical devices that record the heart's electrical activity. Doctors most often [place] these monitors to diagnose arrhythmias. Arrhythmias are problems with the speed or rhythm of the heartbeat. The monitor is a small, portable device. You can wear one while you do your normal daily activities. This is usually used to diagnose what is causing palpitations/syncope (passing out).    STOP Aspirin  STOP Nitroglycerine  DECREASE Toprol to 50 mg twice a day      Thank you for choosing [facility] !

## 2015-01-15 NOTE — Addendum Note (Signed)
Addended by: Marlyn Corporal A on: 01/15/2015 04:51 PM   Modules accepted: Level of Service

## 2015-01-16 ENCOUNTER — Telehealth: Payer: Self-pay | Admitting: *Deleted

## 2015-01-16 ENCOUNTER — Other Ambulatory Visit: Payer: Self-pay

## 2015-01-16 MED ORDER — METOPROLOL SUCCINATE ER 50 MG PO TB24: 50.0000 mg | ORAL_TABLET | Freq: Two times a day (BID) | ORAL | Status: DC

## 2015-01-16 MED ORDER — METOPROLOL TARTRATE 50 MG PO TABS: 50.0000 mg | ORAL_TABLET | Freq: Two times a day (BID) | ORAL | Status: DC

## 2015-01-16 NOTE — Telephone Encounter (Signed)
Toprol 50 mg BID escribed to [facility] pharmacy pt has no vm st up on phone to leave message. Unable to reach pt

## 2015-01-16 NOTE — Telephone Encounter (Signed)
Pt Rx was for metoprolol was called in for once a day and it was changed yesterday to twice a day.

## 2015-01-21 ENCOUNTER — Telehealth: Payer: Self-pay | Admitting: Cardiovascular Disease

## 2015-01-21 NOTE — Telephone Encounter (Signed)
Pt c/o redness at site from electrode patches from event monitor.Suggested that patient try OTC hydrocortisone cream and that she change lead sites everyday vs every 2 days that she is currently doing.Pt agrees with advise

## 2015-01-21 NOTE — Telephone Encounter (Signed)
Please call patient back regarding being allergic to patches from monitor / tgs

## 2015-02-01 ENCOUNTER — Ambulatory Visit: Payer: Medicare PPO | Admitting: Cardiovascular Disease

## 2015-02-04 ENCOUNTER — Telehealth: Payer: Self-pay | Admitting: Cardiovascular Disease

## 2015-02-04 NOTE — Telephone Encounter (Signed)
Patient called to let us know that she took her monitor off a week early due to being allergic to the stick pads. They were causing blisters.

## 2015-02-04 NOTE — Telephone Encounter (Signed)
Noted  

## 2015-02-15 ENCOUNTER — Telehealth: Payer: Self-pay

## 2015-02-15 NOTE — Telephone Encounter (Signed)
Pt notified that monitor was NSR

## 2015-02-21 ENCOUNTER — Other Ambulatory Visit: Payer: Self-pay | Admitting: *Deleted

## 2015-02-21 DIAGNOSIS — I480 Paroxysmal atrial fibrillation: Secondary | ICD-10-CM

## 2015-03-22 ENCOUNTER — Encounter: Payer: Self-pay | Admitting: Cardiovascular Disease

## 2015-03-22 ENCOUNTER — Ambulatory Visit (INDEPENDENT_AMBULATORY_CARE_PROVIDER_SITE_OTHER): Payer: Medicare PPO | Admitting: Cardiovascular Disease

## 2015-03-22 VITALS — BP 102/72 | HR 90 | Ht 65.0 in | Wt 295.0 lb

## 2015-03-22 DIAGNOSIS — K5900 Constipation, unspecified: Secondary | ICD-10-CM

## 2015-03-22 DIAGNOSIS — I480 Paroxysmal atrial fibrillation: Secondary | ICD-10-CM | POA: Diagnosis not present

## 2015-03-22 DIAGNOSIS — I10 Essential (primary) hypertension: Secondary | ICD-10-CM

## 2015-03-22 MED ORDER — FUROSEMIDE 20 MG PO TABS: 20.0000 mg | ORAL_TABLET | Freq: Every day | ORAL | Status: DC

## 2015-03-22 NOTE — Progress Notes (Signed)
Patient ID: Leslie Alexander, female   DOB: 12/28/1940, 74 y.o.   MRN: [id number]      SUBJECTIVE: The patient returns for follow-up of paroxysmal atrial fibrillation and palpitations. She recently wore an event monitor which demonstrated sinus rhythm and sinus bradycardia with no evidence of atrial fibrillation and no symptoms were reported.  She denies chest pain, shortness of breath, palpitations, and leg swelling. She has been constipated and is only having a bowel movement once per week. She is on Lasix 40 mg twice daily. Echocardiogram in January 2016 showed normal left ventricular systolic function, EF 50-55%.   Review of Systems: As per "subjective", otherwise negative.  Allergies  Allergen Reactions  . Codeine Palpitations  . Ace Inhibitors Cough    Current Outpatient Prescriptions  Medication Sig Dispense Refill  . apixaban (ELIQUIS) 5 MG TABS tablet Take 1 tablet (5 mg total) by mouth 2 (two) times daily. 60 tablet 11  . beta carotene w/minerals (OCUVITE) tablet Take 1 tablet by mouth daily.    . Calcium Carbonate-Vitamin D (CALCIUM + D PO) Take 1 tablet by mouth daily.    . Cyanocobalamin (VITAMIN B-12 PO) Take 1 tablet by mouth daily.    . fexofenadine (ALLEGRA) 180 MG tablet Take 180 mg by mouth daily as needed for allergies.     . furosemide (LASIX) 40 MG tablet Take 1 tablet (40 mg total) by mouth 2 (two) times daily. 60 tablet 2  . gabapentin (NEURONTIN) 300 MG capsule Take 300 mg by mouth 3 (three) times daily.    Marland Kitchen levothyroxine (SYNTHROID, LEVOTHROID) 100 MCG tablet Take 100 mcg by mouth daily.    . metoprolol succinate (TOPROL-XL) 50 MG 24 hr tablet Take 1 tablet (50 mg total) by mouth 2 (two) times daily. Take with or immediately following a meal. 180 tablet 3  . Misc Natural Products (OSTEO BI-FLEX JOINT SHIELD PO) Take 1 tablet by mouth daily.    . Multiple Vitamins-Minerals (MULTIVITAMINS THER. W/MINERALS) TABS tablet Take 1 tablet by mouth daily.    Marland Kitchen  omeprazole (PRILOSEC) 20 MG capsule Take 20 mg by mouth daily.    . potassium chloride SA (K-DUR,KLOR-CON) 20 MEQ tablet Take 1 tablet (20 mEq total) by mouth daily. 30 tablet 2  . traMADol (ULTRAM) 50 MG tablet Take 50-100 mg by mouth 3 (three) times daily. 2 in the morning, 1 in afternoon, and 1 at bedtime.    . valsartan-hydrochlorothiazide (DIOVAN-HCT) 80-12.5 MG per tablet Take 1 tablet by mouth daily.     No current facility-administered medications for this visit.    Past Medical History  Diagnosis Date  . Atrial fibrillation   . Essential hypertension, benign   . Mixed hyperlipidemia   . Hypothyroidism   . Depression   . Morbid obesity   . Lumbar disc disease   . GERD (gastroesophageal reflux disease)   . Nonalcoholic fatty liver disease   . History of breast cancer   . Meralgia paresthetica, right   . Left bundle branch block     negative Lexiscan Myoview; EF 56%, 3/13   . Diastolic heart failure     LVEF 55-60%  . Pulmonary hypertension     RVSP 55-60 mm mercury  . Hx of atrial fibrillation, no current medication     converted out of A. Fib with cardioversion  . OSA (obstructive sleep apnea)     CPAP machine    Past Surgical History  Procedure Laterality Date  . Tubal ligation  BILATERAL  . Laparoscopic cholecystectomy  03/02/2011    Dr. Gabriel Cirri  . Cataract extraction, bilateral  04/19/2008    Dr. Alto Denver  . Femoral exploration  12/2003    RIGHT LATERAL FEMORAL CUTANEOUS NERVE/Dr. Channing Mutters  . Knee arthroscopy  10/2005    Left knee/from torn cartilage Dr. Edger House  . Heel spur surgery  07/2006    For spurs/Dr. Ulice Brilliant  . Pars plana vitrectomy w/ repair of macular hole  05/2008    Dr. Luciana Axe  . Total knee arthroplasty Left 12/29/2011    Dr. Chaney Malling  . Incision and drainage of wound  01/26/2012    Dr. Chaney Malling  . Mastectomy, radical Left 1989    With chemotherapy  . Cataract extraction w/phaco Left 12/18/2013    Procedure: CATARACT EXTRACTION PHACO AND  INTRAOCULAR LENS PLACEMENT (IOC);  Surgeon: Gemma Payor, MD;  Location: [facility];  Service: Ophthalmology;  Laterality: Left;  CDE 12.70  . Left heart catheterization with coronary angiogram N/A 12/20/2014    Procedure: LEFT HEART CATHETERIZATION WITH CORONARY ANGIOGRAM;  Surgeon: Peter M Swaziland, MD;  Location: [facility] CATH LAB;  Service: Cardiovascular;  Laterality: N/A;    History   Social History  . Marital Status: Married    Spouse Name: N/A  . Number of Children: N/A  . Years of Education: N/A   Occupational History  . Not on file.   Social History Main Topics  . Smoking status: Former Smoker -- 0.50 packs/day for 10 years    Types: Cigarettes    Start date: 06/03/1965    Quit date: 11/24/1987  . Smokeless tobacco: Never Used  . Alcohol Use: No  . Drug Use: No  . Sexual Activity: No   Other Topics Concern  . Not on file   Social History Narrative   Has 1 daughter     Ceasar Mons Vitals:   03/22/15 0933  BP: 102/72  Pulse: 90  Height:  (1.651 m)  Weight: 295 lb (133.811 kg)  SpO2: 98%    PHYSICAL EXAM General: NAD HEENT: Normal. Neck: No JVD, no thyromegaly. Lungs: Clear to auscultation bilaterally with normal respiratory effort. CV: Nondisplaced PMI.  Regular rate and rhythm, normal S1/S2, no S3/S4, no murmur. No pretibial or periankle edema. +venous varicosities b/l.  Abdomen: Soft, obese, no distention.  Neurologic: Alert and oriented x 3.  Psych: Normal affect. Skin: Normal. Musculoskeletal: No gross deformities. Extremities: No clubbing or cyanosis.   ECG: Most recent ECG reviewed.      ASSESSMENT AND PLAN:  1. Paroxysmal atrial fibrillation: Symptomatically stable. Continue Toprol-XL 50 mg twice daily and Eliquis 5 mg twice daily.  2. Essential hypertension: Well controlled. No changes.  3. Constipation: High Lasix dose likely contributing. She has normal left ventricular systolic function as noted above. Lower extremity intermittent edema is due to  chronic venous insufficiency. I will reduce Lasix to 20 g daily to see if this helps to improve her symptoms.  Dispo: f/u 6 months.   Prentice Docker, M.D., F.A.C.C.

## 2015-03-22 NOTE — Patient Instructions (Signed)
   Decrease Lasix to 20mg  daily - new printed script given today. Continue all other medications.   Your physician wants you to follow up in: 6 months.  You will receive a reminder letter in the mail one-two months in advance.  If you don't receive a letter, please call our office to schedule the follow up appointment

## 2015-03-25 ENCOUNTER — Ambulatory Visit: Payer: Medicare PPO | Admitting: Cardiovascular Disease

## 2015-05-20 ENCOUNTER — Other Ambulatory Visit: Payer: Self-pay | Admitting: Physician Assistant

## 2015-05-20 ENCOUNTER — Other Ambulatory Visit: Payer: Self-pay | Admitting: *Deleted

## 2015-05-20 MED ORDER — FUROSEMIDE 20 MG PO TABS: 20.0000 mg | ORAL_TABLET | Freq: Every day | ORAL | Status: DC

## 2015-05-20 NOTE — Telephone Encounter (Signed)
Please review for refill. Thanks!  

## 2015-09-09 ENCOUNTER — Other Ambulatory Visit: Payer: Self-pay | Admitting: Cardiovascular Disease

## 2015-09-13 ENCOUNTER — Ambulatory Visit: Payer: Medicare PPO | Admitting: Cardiovascular Disease

## 2015-10-01 ENCOUNTER — Encounter: Payer: Self-pay | Admitting: Cardiovascular Disease

## 2015-10-01 ENCOUNTER — Ambulatory Visit (INDEPENDENT_AMBULATORY_CARE_PROVIDER_SITE_OTHER): Payer: Medicare PPO | Admitting: Cardiovascular Disease

## 2015-10-01 VITALS — BP 108/70 | HR 81 | Ht 65.0 in | Wt 294.0 lb

## 2015-10-01 DIAGNOSIS — I480 Paroxysmal atrial fibrillation: Secondary | ICD-10-CM

## 2015-10-01 DIAGNOSIS — I10 Essential (primary) hypertension: Secondary | ICD-10-CM | POA: Diagnosis not present

## 2015-10-01 NOTE — Patient Instructions (Signed)
Continue all current medications. Your physician wants you to follow up in:  1 year.  You will receive a reminder letter in the mail one-two months in advance.  If you don't receive a letter, please call our office to schedule the follow up appointment   

## 2015-10-01 NOTE — Progress Notes (Signed)
Patient ID: Leslie Alexander, female   DOB: 09-22-1941, 74 y.o.   MRN: [id number]      SUBJECTIVE: The patient returns for follow-up of paroxysmal atrial fibrillation and palpitations. She wore an event monitor in 01/2015 which demonstrated sinus rhythm and sinus bradycardia with no evidence of atrial fibrillation and no symptoms were reported.  She denies chest pain, shortness of breath, palpitations, and leg swelling.  She is grieving the loss of her husband who passed away earlier this year.  Echocardiogram in January 2016 showed normal left ventricular systolic function, EF 50-55%.    Review of Systems: As per "subjective", otherwise negative.  Allergies  Allergen Reactions  . Codeine Palpitations  . Ace Inhibitors Cough    Current Outpatient Prescriptions  Medication Sig Dispense Refill  . apixaban (ELIQUIS) 5 MG TABS tablet Take 1 tablet (5 mg total) by mouth 2 (two) times daily. 60 tablet 11  . beta carotene w/minerals (OCUVITE) tablet Take 1 tablet by mouth daily.    . Calcium Carbonate-Vitamin D (CALCIUM + D PO) Take 1 tablet by mouth daily.    . Cyanocobalamin (VITAMIN B-12 PO) Take 1 tablet by mouth daily.    . fexofenadine (ALLEGRA) 180 MG tablet Take 180 mg by mouth daily as needed for allergies.     . furosemide (LASIX) 20 MG tablet Take 1 tablet (20 mg total) by mouth daily. 90 tablet 3  . gabapentin (NEURONTIN) 300 MG capsule Take 300 mg by mouth 3 (three) times daily.    Marland Kitchen levothyroxine (SYNTHROID, LEVOTHROID) 100 MCG tablet Take 100 mcg by mouth daily.    . metoprolol succinate (TOPROL-XL) 50 MG 24 hr tablet TAKE 1 TABLET 2 TIMES A DAY, TAKE WITH OR IMMEDIATELY FOLLOWING A MEAL. 60 tablet 6  . Misc Natural Products (OSTEO BI-FLEX JOINT SHIELD PO) Take 1 tablet by mouth daily.    . Multiple Vitamins-Minerals (MULTIVITAMINS THER. W/MINERALS) TABS tablet Take 1 tablet by mouth daily.    Marland Kitchen omeprazole (PRILOSEC) 20 MG capsule Take 20 mg by mouth daily.    . potassium  chloride (K-DUR) 10 MEQ tablet Take 1 tablet by mouth daily.    . traMADol (ULTRAM) 50 MG tablet Take 50-100 mg by mouth 3 (three) times daily. 2 in the morning, 1 in afternoon, and 1 at bedtime.    . valsartan-hydrochlorothiazide (DIOVAN-HCT) 80-12.5 MG per tablet Take 1 tablet by mouth daily.     No current facility-administered medications for this visit.    Past Medical History  Diagnosis Date  . Atrial fibrillation (HCC)   . Essential hypertension, benign   . Mixed hyperlipidemia   . Hypothyroidism   . Depression   . Morbid obesity (HCC)   . Lumbar disc disease   . GERD (gastroesophageal reflux disease)   . Nonalcoholic fatty liver disease   . History of breast cancer   . Meralgia paresthetica, right   . Left bundle branch block     negative Lexiscan Myoview; EF 56%, 3/13   . Diastolic heart failure     LVEF 55-60%  . Pulmonary hypertension (HCC)     RVSP 55-60 mm mercury  . Hx of atrial fibrillation, no current medication     converted out of A. Fib with cardioversion  . OSA (obstructive sleep apnea)     CPAP machine    Past Surgical History  Procedure Laterality Date  . Tubal ligation      BILATERAL  . Laparoscopic cholecystectomy  03/02/2011    Dr.  DeMason  . Cataract extraction, bilateral  04/19/2008    Dr. Alto Denver  . Femoral exploration  12/2003    RIGHT LATERAL FEMORAL CUTANEOUS NERVE/Dr. Channing Mutters  . Knee arthroscopy  10/2005    Left knee/from torn cartilage Dr. Edger House  . Heel spur surgery  07/2006    For spurs/Dr. Ulice Brilliant  . Pars plana vitrectomy w/ repair of macular hole  05/2008    Dr. Luciana Axe  . Total knee arthroplasty Left 12/29/2011    Dr. Chaney Malling  . Incision and drainage of wound  01/26/2012    Dr. Chaney Malling  . Mastectomy, radical Left 1989    With chemotherapy  . Cataract extraction w/phaco Left 12/18/2013    Procedure: CATARACT EXTRACTION PHACO AND INTRAOCULAR LENS PLACEMENT (IOC);  Surgeon: Gemma Payor, MD;  Location: [facility];  Service: Ophthalmology;   Laterality: Left;  CDE 12.70  . Left heart catheterization with coronary angiogram N/A 12/20/2014    Procedure: LEFT HEART CATHETERIZATION WITH CORONARY ANGIOGRAM;  Surgeon: Peter M Swaziland, MD;  Location: [facility] CATH LAB;  Service: Cardiovascular;  Laterality: N/A;    Social History   Social History  . Marital Status: Married    Spouse Name: N/A  . Number of Children: N/A  . Years of Education: N/A   Occupational History  . Not on file.   Social History Main Topics  . Smoking status: Former Smoker -- 0.50 packs/day for 10 years    Types: Cigarettes    Start date: 06/03/1965    Quit date: 11/24/1987  . Smokeless tobacco: Never Used  . Alcohol Use: No  . Drug Use: No  . Sexual Activity: No   Other Topics Concern  . Not on file   Social History Narrative   Has 1 daughter     Ceasar Mons Vitals:   10/01/15 1108  BP: 108/70  Pulse: 81  Height: 5\' 5"  (1.651 m)  Weight: 294 lb (133.358 kg)    PHYSICAL EXAM General: NAD HEENT: Normal. Neck: No JVD, no thyromegaly. Lungs: Clear to auscultation bilaterally with normal respiratory effort. CV: Nondisplaced PMI. Regular rate and rhythm, normal S1/S2, no S3/S4, no murmur. No pretibial or periankle edema. +venous varicosities b/l.  Abdomen: Soft, obese, no distention.  Neurologic: Alert and oriented x 3.  Psych: Normal affect. Skin: Normal. Musculoskeletal: No gross deformities. Extremities: No clubbing or cyanosis.   ECG: Most recent ECG reviewed.      ASSESSMENT AND PLAN: 1. Paroxysmal atrial fibrillation: Symptomatically stable. Continue Toprol-XL 50 mg twice daily and Eliquis 5 mg twice daily (she had been taking once daily).  2. Essential hypertension: Well controlled. No changes.  Dispo: f/u 1 year.   Prentice Docker, M.D., F.A.C.C.

## 2015-12-21 ENCOUNTER — Other Ambulatory Visit: Payer: Self-pay | Admitting: Cardiovascular Disease

## 2016-01-04 ENCOUNTER — Other Ambulatory Visit: Payer: Self-pay | Admitting: Cardiology

## 2016-01-08 ENCOUNTER — Other Ambulatory Visit: Payer: Self-pay | Admitting: *Deleted

## 2016-01-08 MED ORDER — APIXABAN 5 MG PO TABS: 5.0000 mg | ORAL_TABLET | Freq: Two times a day (BID) | ORAL | Status: AC

## 2016-08-10 IMAGING — CR DG CHEST 1V PORT
1 series · 1 of 1 positions shown · non-contrast
Comparison: 03/18/2012

CLINICAL DATA: Mid chest pain and shortness of breath.

EXAM:
PORTABLE CHEST - 1 VIEW

[AP]
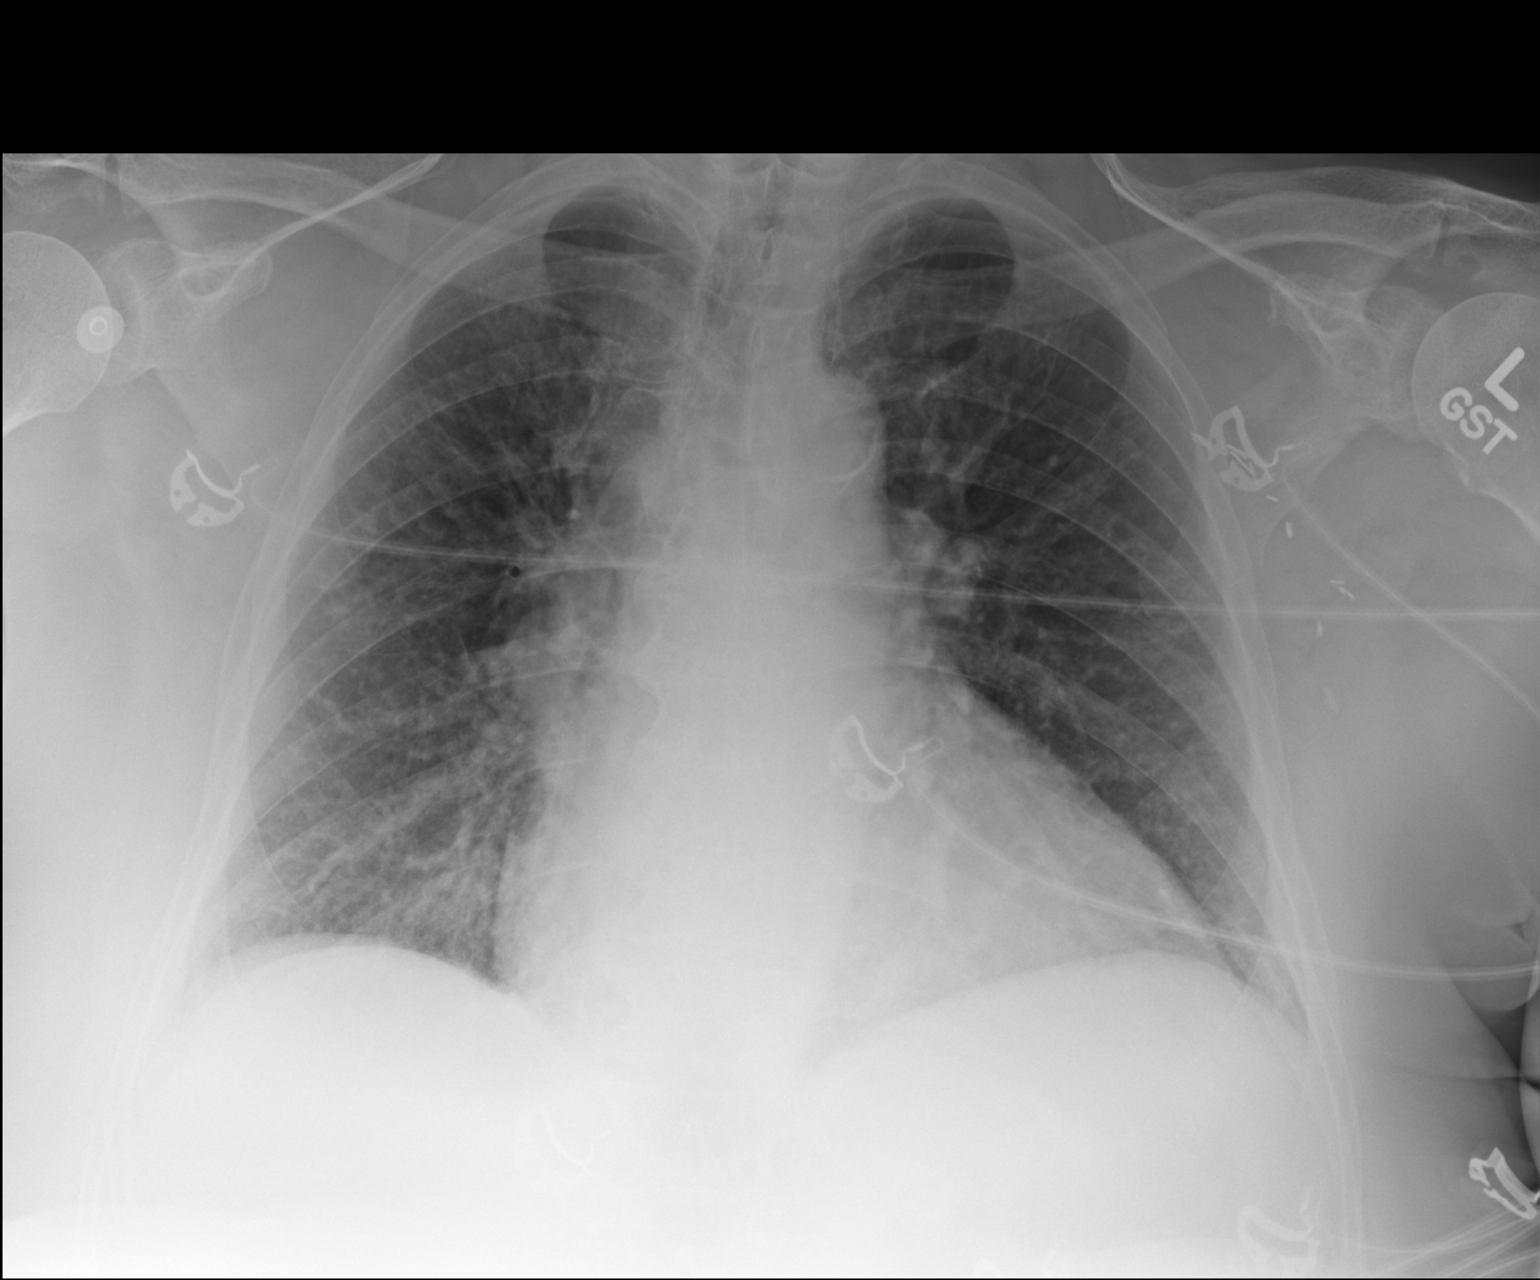

[1 of 1 positions shown; findings below may reference images not displayed]

FINDINGS: Mild cardiac enlargement and diffuse pulmonary vascular congestion.
Slight interstitial change in the lung bases suggest early edema. No
focal consolidation. No blunting of costophrenic angles. No
pneumothorax. Calcified and tortuous aorta. Surgical clips in the
left axilla.
IMPRESSION: Cardiac enlargement with mild pulmonary vascular congestion edema.

## 2016-10-09 ENCOUNTER — Encounter: Payer: Self-pay | Admitting: Cardiovascular Disease

## 2016-10-09 ENCOUNTER — Ambulatory Visit (INDEPENDENT_AMBULATORY_CARE_PROVIDER_SITE_OTHER): Payer: Medicare Other | Admitting: Cardiovascular Disease

## 2016-10-09 VITALS — BP 110/70 | HR 92 | Ht 65.0 in | Wt 280.0 lb

## 2016-10-09 DIAGNOSIS — I10 Essential (primary) hypertension: Secondary | ICD-10-CM

## 2016-10-09 DIAGNOSIS — I480 Paroxysmal atrial fibrillation: Secondary | ICD-10-CM | POA: Diagnosis not present

## 2016-10-09 NOTE — Patient Instructions (Signed)

## 2016-10-09 NOTE — Progress Notes (Signed)
SUBJECTIVE: The patient returns for follow-up of paroxysmal atrial fibrillation and palpitations. She wore an event monitor in 01/2015 which demonstrated sinus rhythm and sinus bradycardia with no evidence of atrial fibrillation and no symptoms were reported.  She denies chest pain, shortness of breath, palpitations, and leg swelling.  Echocardiogram in January 2016 showed normal left ventricular systolic function, EF 50-55%.  ECG performed in the office today shows rate-controlled atrial fibrillation with a left bundle branch block.    Soc: Husband passed away in 03-28-2015.   Review of Systems: As per "subjective", otherwise negative.  Allergies  Allergen Reactions  . Codeine Palpitations  . Ace Inhibitors Cough    Current Outpatient Prescriptions  Medication Sig Dispense Refill  . apixaban (ELIQUIS) 5 MG TABS tablet Take 1 tablet (5 mg total) by mouth 2 (two) times daily. 180 tablet 3  . beta carotene w/minerals (OCUVITE) tablet Take 1 tablet by mouth daily.    . Calcium Carbonate-Vitamin D (CALCIUM + D PO) Take 1 tablet by mouth daily.    . fexofenadine (ALLEGRA) 180 MG tablet Take 180 mg by mouth daily as needed for allergies.     . furosemide (LASIX) 20 MG tablet TAKE 1 TABLET ONCE DAILY. 30 tablet 6  . gabapentin (NEURONTIN) 300 MG capsule Take 300 mg by mouth 3 (three) times daily.    Marland Kitchen levothyroxine (SYNTHROID, LEVOTHROID) 100 MCG tablet Take 100 mcg by mouth daily.    . metoprolol succinate (TOPROL-XL) 50 MG 24 hr tablet TAKE 1 TABLET 2 TIMES A DAY, TAKE WITH OR IMMEDIATELY FOLLOWING A MEAL. 60 tablet 6  . Misc Natural Products (OSTEO BI-FLEX JOINT SHIELD PO) Take 1 tablet by mouth daily.    . Multiple Vitamins-Minerals (MULTIVITAMINS THER. W/MINERALS) TABS tablet Take 1 tablet by mouth daily.    Marland Kitchen omeprazole (PRILOSEC) 20 MG capsule Take 20 mg by mouth daily.    . potassium chloride (K-DUR) 10 MEQ tablet Take 1 tablet by mouth daily.    . primidone (MYSOLINE) 50 MG  tablet Take 50 mg by mouth 2 (two) times daily.    . traMADol (ULTRAM) 50 MG tablet Take 50-100 mg by mouth 3 (three) times daily. 2 in the morning, 1 in afternoon, and 1 at bedtime.     No current facility-administered medications for this visit.     Past Medical History:  Diagnosis Date  . Atrial fibrillation (HCC)   . Depression   . Diastolic heart failure    LVEF 55-60%  . Essential hypertension, benign   . GERD (gastroesophageal reflux disease)   . History of breast cancer   . Hx of atrial fibrillation, no current medication    converted out of A. Fib with cardioversion  . Hypothyroidism   . Left bundle branch block    negative Lexiscan Myoview; EF 56%, 3/13   . Lumbar disc disease   . Meralgia paresthetica, right   . Mixed hyperlipidemia   . Morbid obesity (HCC)   . Nonalcoholic fatty liver disease   . OSA (obstructive sleep apnea)    CPAP machine  . Pulmonary hypertension    RVSP 55-60 mm mercury    Past Surgical History:  Procedure Laterality Date  . CATARACT EXTRACTION W/PHACO Left 12/18/2013   Procedure: CATARACT EXTRACTION PHACO AND INTRAOCULAR LENS PLACEMENT (IOC);  Surgeon: Gemma Payor, MD;  Location: [facility];  Service: Ophthalmology;  Laterality: Left;  CDE 12.70  . CATARACT EXTRACTION, BILATERAL  04/19/2008   Dr. Alto Denver  .  FEMORAL EXPLORATION  12/2003   RIGHT LATERAL FEMORAL CUTANEOUS NERVE/Dr. Channing Muttersoy  . HEEL SPUR SURGERY  07/2006   For spurs/Dr. Ulice Brilliantrake  . INCISION AND DRAINAGE OF WOUND  01/26/2012   Dr. Chaney MallingMortenson  . KNEE ARTHROSCOPY  10/2005   Left knee/from torn cartilage Dr. Edger HouseMcGinley  . LAPAROSCOPIC CHOLECYSTECTOMY  03/02/2011   Dr. Gabriel CirrieMason  . LEFT HEART CATHETERIZATION WITH CORONARY ANGIOGRAM N/A 12/20/2014   Procedure: LEFT HEART CATHETERIZATION WITH CORONARY ANGIOGRAM;  Surgeon: Peter M SwazilandJordan, MD;  [facility];  Service: Cardiovascular;  Laterality: N/A;  . MASTECTOMY, RADICAL Left 1989   With chemotherapy  . PARS PLANA VITRECTOMY W/ REPAIR OF  MACULAR HOLE  05/2008   Dr. Luciana Axeankin  . TOTAL KNEE ARTHROPLASTY Left 12/29/2011   Dr. Chaney MallingMortenson  . TUBAL LIGATION     BILATERAL    Social History   Social History  . Marital status: Married    Spouse name: N/A  . Number of children: N/A  . Years of education: N/A   Occupational History  . Not on file.   Social History Main Topics  . Smoking status: Former Smoker    Packs/day: 0.50    Years: 10.00    Types: Cigarettes    Start date: 06/03/1965    Quit date: 11/24/1987  . Smokeless tobacco: Never Used  . Alcohol use No  . Drug use: No  . Sexual activity: No   Other Topics Concern  . Not on file   Social History Narrative   Has 1 daughter     Vitals:   10/09/16 0954  BP: 110/70  Pulse: 92  SpO2: 99%  Weight: 280 lb (127 kg)  Height: 5\' 5"  (1.651 m)    PHYSICAL EXAM General: NAD HEENT: Normal. Neck: No JVD, no thyromegaly. Lungs: Clear to auscultation bilaterally with normal respiratory effort. CV: Nondisplaced PMI. Regular rate and irregular rhythm, normal S1/S2, no S3, no murmur. No pretibial or periankle edema. +venous varicosities b/l.  Abdomen: Soft, obese, no distention.  Neurologic: Alert and oriented x 3.  Psych: Normal affect. Skin: Normal. Musculoskeletal: No gross deformities. Extremities: No clubbing or cyanosis.     ECG: Most recent ECG reviewed.      ASSESSMENT AND PLAN: 1. Paroxysmal atrial fibrillation: Symptomatically stable. Continue Toprol-XL 50 mg twice daily and Eliquis 5 mg twice daily.  2. Essential hypertension: Well controlled. No changes.  Dispo: f/u 1 year.   Prentice DockerSuresh Elize Pinon, M.D., F.A.C.C.

## 2017-09-27 ENCOUNTER — Encounter: Payer: Self-pay | Admitting: Cardiovascular Disease

## 2017-09-27 ENCOUNTER — Ambulatory Visit: Payer: Medicare Other | Admitting: Cardiovascular Disease

## 2017-09-27 VITALS — BP 99/66 | HR 75 | Ht 65.0 in | Wt 261.4 lb

## 2017-09-27 DIAGNOSIS — I480 Paroxysmal atrial fibrillation: Secondary | ICD-10-CM | POA: Diagnosis not present

## 2017-09-27 DIAGNOSIS — I10 Essential (primary) hypertension: Secondary | ICD-10-CM | POA: Diagnosis not present

## 2017-09-27 NOTE — Progress Notes (Signed)
SUBJECTIVE: The patient presents for follow-up of paroxysmal atrial fibrillation and hypertension.  Event monitoring in March 2016 demonstrated sinus rhythm and sinus bradycardia with no evidence of atrial fibrillation and no symptoms were reported.  Echocardiogram genera 2016 demonstrated normal left ventricular systolic function, LVEF 50-55%.  She has a chronic left bundle branch block.   She denies chest pain and shortness of breath.  She has an occasional dry cough which she attributes to allergies.  She very seldom has palpitations.  Primary complaints relate to bilateral knee arthritis.  She had a steroid injection in her right knee and it did not alleviate her symptoms.  Right knee replacement surgery is being considered.  ECG performed in the office today which I personally interpreted demonstrated atrial fibrillation with left bundle branch block, heart rate 72 bpm.       Review of Systems: As per "subjective", otherwise negative.  Allergies  Allergen Reactions  . Codeine Palpitations  . Ace Inhibitors Cough    Current Outpatient Medications  Medication Sig Dispense Refill  . allopurinol (ZYLOPRIM) 300 MG tablet Take 300 mg daily by mouth.  6  . apixaban (ELIQUIS) 5 MG TABS tablet Take 1 tablet (5 mg total) by mouth 2 (two) times daily. 180 tablet 3  . Colchicine 0.6 MG CAPS Take 1 capsule 2 (two) times daily by mouth.  1  . fexofenadine (ALLEGRA) 180 MG tablet Take 180 mg by mouth daily as needed for allergies.     . furosemide (LASIX) 20 MG tablet TAKE 1 TABLET ONCE DAILY. 30 tablet 6  . gabapentin (NEURONTIN) 300 MG capsule Take 300 mg by mouth 3 (three) times daily.    . Glucos-Chond-Hyal Ac-Ca Fructo (MOVE FREE JOINT HEALTH ADVANCE PO) Take 1 capsule 2 (two) times daily by mouth.    . levothyroxine (SYNTHROID, LEVOTHROID) 100 MCG tablet Take 100 mcg by mouth daily.    Marland Kitchen losartan (COZAAR) 50 MG tablet Take 50 mg daily by mouth.  3  . metoprolol succinate  (TOPROL-XL) 50 MG 24 hr tablet TAKE 1 TABLET 2 TIMES A DAY, TAKE WITH OR IMMEDIATELY FOLLOWING A MEAL. 60 tablet 6  . Multiple Vitamins-Minerals (MULTIVITAMINS THER. W/MINERALS) TABS tablet Take 1 tablet by mouth daily.    Marland Kitchen omeprazole (PRILOSEC) 20 MG capsule Take 20 mg by mouth daily.    . polyethylene glycol powder (GLYCOLAX/MIRALAX) powder Take 17 g daily as needed by mouth.  6  . potassium chloride (K-DUR) 10 MEQ tablet Take 1 tablet by mouth daily.    . primidone (MYSOLINE) 50 MG tablet Take 50 mg by mouth 2 (two) times daily.    . traMADol (ULTRAM) 50 MG tablet Take 50-100 mg by mouth 3 (three) times daily. 2 in the morning, 1 in afternoon, and 1 at bedtime.     No current facility-administered medications for this visit.     Past Medical History:  Diagnosis Date  . Atrial fibrillation (HCC)   . Depression   . Diastolic heart failure    LVEF 55-60%  . Essential hypertension, benign   . GERD (gastroesophageal reflux disease)   . History of breast cancer   . Hx of atrial fibrillation, no current medication    converted out of A. Fib with cardioversion  . Hypothyroidism   . Left bundle branch block    negative Lexiscan Myoview; EF 56%, 3/13   . Lumbar disc disease   . Meralgia paresthetica, right   . Mixed hyperlipidemia   .  Morbid obesity (HCC)   . Nonalcoholic fatty liver disease   . OSA (obstructive sleep apnea)    CPAP machine  . Pulmonary hypertension (HCC)    RVSP 55-60 mm mercury    Past Surgical History:  Procedure Laterality Date  . CATARACT EXTRACTION, BILATERAL  04/19/2008   Dr. Alto DenverHunt  . FEMORAL EXPLORATION  12/2003   RIGHT LATERAL FEMORAL CUTANEOUS NERVE/Dr. Channing Muttersoy  . HEEL SPUR SURGERY  07/2006   For spurs/Dr. Ulice Brilliantrake  . INCISION AND DRAINAGE OF WOUND  01/26/2012   Dr. Chaney MallingMortenson  . KNEE ARTHROSCOPY  10/2005   Left knee/from torn cartilage Dr. Edger HouseMcGinley  . LAPAROSCOPIC CHOLECYSTECTOMY  03/02/2011   Dr. Gabriel CirrieMason  . MASTECTOMY, RADICAL Left 1989   With  chemotherapy  . PARS PLANA VITRECTOMY W/ REPAIR OF MACULAR HOLE  05/2008   Dr. Luciana Axeankin  . TOTAL KNEE ARTHROPLASTY Left 12/29/2011   Dr. Chaney MallingMortenson  . TUBAL LIGATION     BILATERAL    Social History   Socioeconomic History  . Marital status: Married    Spouse name: Not on file  . Number of children: Not on file  . Years of education: Not on file  . Highest education level: Not on file  Social Needs  . Financial resource strain: Not on file  . Food insecurity - worry: Not on file  . Food insecurity - inability: Not on file  . Transportation needs - medical: Not on file  . Transportation needs - non-medical: Not on file  Occupational History  . Not on file  Tobacco Use  . Smoking status: Former Smoker    Packs/day: 0.50    Years: 10.00    Pack years: 5.00    Types: Cigarettes    Start date: 06/03/1965    Last attempt to quit: 11/24/1987    Years since quitting: 29.8  . Smokeless tobacco: Never Used  Substance and Sexual Activity  . Alcohol use: No    Alcohol/week: 0.0 oz  . Drug use: No  . Sexual activity: No  Other Topics Concern  . Not on file  Social History Narrative   Has 1 daughter     Vitals:   09/27/17 0946  BP: 99/66  Pulse: 75  Weight: 261 lb 6.4 oz (118.6 kg)  Height: 5\' 5"  (1.651 m)    Wt Readings from Last 3 Encounters:  09/27/17 261 lb 6.4 oz (118.6 kg)  10/09/16 280 lb (127 kg)  10/01/15 294 lb (133.4 kg)     PHYSICAL EXAM General: NAD HEENT: Normal. Neck: No JVD, no thyromegaly. Lungs: Clear to auscultation bilaterally with normal respiratory effort. CV: Regular rate and irregular rhythm, normal S1/S2, no S3, no murmur. No pretibial or periankle edema.  No carotid bruit.   Abdomen: Soft, nontender, no distention.  Neurologic: Alert and oriented.  Psych: Normal affect. Skin: Normal. Musculoskeletal: No gross deformities.    ECG: Most recent ECG reviewed.   Labs: Lab Results  Component Value Date/Time   K 3.8 12/19/2014 06:45 AM     BUN 24 (H) 12/19/2014 06:45 AM   CREATININE 1.14 (H) 12/19/2014 06:45 AM   HGB 11.8 (L) 12/20/2014 04:17 AM     Lipids: Lab Results  Component Value Date/Time   LDLCALC 135 (H) 12/19/2014 06:45 AM   CHOL 190 12/19/2014 06:45 AM   TRIG 74 12/19/2014 06:45 AM   HDL 40 12/19/2014 06:45 AM       ASSESSMENT AND PLAN: 1. Paroxysmal atrial fibrillation: Symptomatically stable with good heart  rate control on Toprol-XL 50 mg twice daily.  She is anticoagulated with Eliquis 5 millgrams twice daily.  No changes to therapy.  2. Essential hypertension: Well controlled. No changes.       Disposition: Follow up 1 year   Prentice Docker, M.D., F.A.C.C.

## 2017-09-27 NOTE — Patient Instructions (Signed)

## 2018-09-09 NOTE — Progress Notes (Addendum)
Leslie Alexander was seen today in the movement disorders clinic for neurologic consultation at the request of Richardean Chimera, MD.  The consultation is for the evaluation of tremor.  This patient is accompanied in the office by her child who supplements the history.  Tremor: Yes.     How long has it been going on? Longer than a year  At rest or with activation?  unknown  When is it noted the most?  "all the time"  Fam hx of tremor?  No.  Located where?  Hands and feet  Affected by caffeine:  No. (2-3 cups coffee/day; eats chocolate)  Affected by alcohol:  Doesn't drink EtOH  Affected by stress:  Yes.    Affected by fatigue:  Yes.    Spills soup if on spoon:  Yes.    Spills glass of liquid if full:  Yes.    Affects ADL's (tying shoes, brushing teeth, etc):  Yes.   (that is why changed to electric toothbrush)  Tremor inducing meds:  No.   Tremor improving meds: was on primidone 50 mg bid - some help but recently d/c  Other Specific Symptoms:  Voice: weaker Sleep: some trouble sleeping  Vivid Dreams:  Yes.    Acting out dreams:  Yes.   Wet Pillows: occasionally Postural symptoms:  Yes.  , attributes to arthritis  Falls?  Yes.   , last was in July; was in hurry to answer the door and she fell Bradykinesia symptoms: slow movements Loss of smell:  No. Loss of taste:  No. Urinary Incontinence:  No. Difficulty Swallowing:  No. (not unless takes big bites) Handwriting, micrographia: Yes.   Trouble buttoning clothing: Yes.   Depression:  No. per pt but pt states that daughter would disagree Memory changes:  Yes.   , worse as day goes along (daughter lives with pt and helps to care for her since June; able to remember own meds; pt drives some; daughter cooks but before June, pt paid someone to cook/clean; daughter does monthly fianances since June but prior to June she paid someone to do it because she couldn't write because of tremor) Hallucinations:  Yes.  , sees her deceased husband  and hears him too.  Will see her deceased mom and sister too.    visual distortions: Yes.   N/V:  Yes.  , nausea Lightheaded:  occasionally  Syncope: No. Diplopia:  No. Dyskinesia:  No.  Neuroimaging of the brain has previously been performed.  It is not available for my review today.  States it was done years ago  PREVIOUS MEDICATIONS: none to date  ALLERGIES:   Allergies  Allergen Reactions  . Codeine Palpitations  . Latex   . Ace Inhibitors Cough    CURRENT MEDICATIONS:  Outpatient Encounter Medications as of 09/13/2018  Medication Sig  . allopurinol (ZYLOPRIM) 300 MG tablet Take 300 mg daily by mouth.  Marland Kitchen apixaban (ELIQUIS) 5 MG TABS tablet Take 1 tablet (5 mg total) by mouth 2 (two) times daily.  . Colchicine 0.6 MG CAPS Take 1 capsule 2 (two) times daily by mouth.  . fexofenadine (ALLEGRA) 180 MG tablet Take 180 mg by mouth daily as needed for allergies.   . furosemide (LASIX) 20 MG tablet TAKE 1 TABLET ONCE DAILY.  Marland Kitchen gabapentin (NEURONTIN) 300 MG capsule Take 300 mg by mouth 3 (three) times daily.  . Glucos-Chond-Hyal Ac-Ca Fructo (MOVE FREE JOINT HEALTH ADVANCE PO) Take 1 capsule 2 (two) times daily by mouth.  Marland Kitchen  levothyroxine (SYNTHROID, LEVOTHROID) 100 MCG tablet Take 100 mcg by mouth daily.  Marland Kitchen losartan (COZAAR) 50 MG tablet Take 50 mg daily by mouth.  . metoprolol succinate (TOPROL-XL) 50 MG 24 hr tablet TAKE 1 TABLET 2 TIMES A DAY, TAKE WITH OR IMMEDIATELY FOLLOWING A MEAL.  . Multiple Vitamins-Minerals (MULTIVITAMINS THER. W/MINERALS) TABS tablet Take 1 tablet by mouth daily.  Marland Kitchen omeprazole (PRILOSEC) 20 MG capsule Take 20 mg by mouth daily.  . polyethylene glycol powder (GLYCOLAX/MIRALAX) powder Take 17 g daily as needed by mouth.  . potassium chloride (K-DUR) 10 MEQ tablet Take 1 tablet by mouth daily.  . primidone (MYSOLINE) 50 MG tablet Take 50 mg by mouth 2 (two) times daily.  . traMADol (ULTRAM) 50 MG tablet Take 50-100 mg by mouth 3 (three) times daily. 2 in the  morning, 1 in afternoon, and 1 at bedtime.   No facility-administered encounter medications on file as of 09/13/2018.     PAST MEDICAL HISTORY:   Past Medical History:  Diagnosis Date  . Atrial fibrillation (HCC)   . Depression   . Diastolic heart failure    LVEF 55-60%  . Essential hypertension, benign   . GERD (gastroesophageal reflux disease)   . History of breast cancer   . Hx of atrial fibrillation, no current medication    converted out of A. Fib with cardioversion  . Hypothyroidism   . Left bundle branch block    negative Lexiscan Myoview; EF 56%, 3/13   . Lumbar disc disease   . Meralgia paresthetica, right   . Mixed hyperlipidemia   . Morbid obesity (HCC)   . Nonalcoholic fatty liver disease   . OSA (obstructive sleep apnea)    CPAP machine  . Pulmonary hypertension (HCC)    RVSP 55-60 mm mercury    PAST SURGICAL HISTORY:   Past Surgical History:  Procedure Laterality Date  . CATARACT EXTRACTION W/PHACO Left 12/18/2013   Procedure: CATARACT EXTRACTION PHACO AND INTRAOCULAR LENS PLACEMENT (IOC);  Surgeon: Gemma Payor, MD;  Location: [facility];  Service: Ophthalmology;  Laterality: Left;  CDE 12.70  . CATARACT EXTRACTION, BILATERAL  04/19/2008   Dr. Alto Denver  . FEMORAL EXPLORATION  12/2003   RIGHT LATERAL FEMORAL CUTANEOUS NERVE/Dr. Channing Mutters  . HEEL SPUR SURGERY  07/2006   For spurs/Dr. Ulice Brilliant  . INCISION AND DRAINAGE OF WOUND  01/26/2012   Dr. Chaney Malling  . KNEE ARTHROSCOPY  10/2005   Left knee/from torn cartilage Dr. Edger House  . LAPAROSCOPIC CHOLECYSTECTOMY  03/02/2011   Dr. Gabriel Cirri  . LEFT HEART CATHETERIZATION WITH CORONARY ANGIOGRAM N/A 12/20/2014   Procedure: LEFT HEART CATHETERIZATION WITH CORONARY ANGIOGRAM;  Surgeon: Peter M Swaziland, MD;  Location: [facility] CATH LAB;  Service: Cardiovascular;  Laterality: N/A;  . MASTECTOMY, RADICAL Left 1989   With chemotherapy  . PARS PLANA VITRECTOMY W/ REPAIR OF MACULAR HOLE  05/2008   Dr. Luciana Axe  . TOTAL KNEE ARTHROPLASTY Left  12/29/2011   Dr. Chaney Malling  . TUBAL LIGATION     BILATERAL    SOCIAL HISTORY:   Social History   Socioeconomic History  . Marital status: Married    Spouse name: Not on file  . Number of children: Not on file  . Years of education: Not on file  . Highest education level: Not on file  Occupational History  . Not on file  Social Needs  . Financial resource strain: Not on file  . Food insecurity:    Worry: Not on file    Inability:  Not on file  . Transportation needs:    Medical: Not on file    Non-medical: Not on file  Tobacco Use  . Smoking status: Former Smoker    Packs/day: 0.50    Years: 10.00    Pack years: 5.00    Types: Cigarettes    Start date: 06/03/1965    Last attempt to quit: 11/24/1987    Years since quitting: 30.8  . Smokeless tobacco: Never Used  Substance and Sexual Activity  . Alcohol use: No    Alcohol/week: 0.0 standard drinks  . Drug use: No  . Sexual activity: Never  Lifestyle  . Physical activity:    Days per week: Not on file    Minutes per session: Not on file  . Stress: Not on file  Relationships  . Social connections:    Talks on phone: Not on file    Gets together: Not on file    Attends religious service: Not on file    Active member of club or organization: Not on file    Attends meetings of clubs or organizations: Not on file    Relationship status: Not on file  . Intimate partner violence:    Fear of current or ex partner: Not on file    Emotionally abused: Not on file    Physically abused: Not on file    Forced sexual activity: Not on file  Other Topics Concern  . Not on file  Social History Narrative   Has 1 daughter    FAMILY HISTORY:   Family Status  Relation Name Status  . Father  Deceased at age 49  . Mother  Deceased at age 30  . Sister  (Not Specified)  . Sister  (Not Specified)    ROS:  Review of Systems  Constitutional: Negative.   HENT: Negative.   Eyes: Negative.   Respiratory: Positive for shortness of  breath.   Cardiovascular: Negative.   Gastrointestinal: Positive for constipation and nausea.  Genitourinary: Negative.   Musculoskeletal: Positive for joint pain (knee pain bilaterally).  Skin: Negative.   Endo/Heme/Allergies: Negative.   Psychiatric/Behavioral: Negative.     PHYSICAL EXAMINATION:    VITALS:   Vitals:   09/13/18 1001  BP: 120/70  Pulse: 68  SpO2: 94%  Weight: 262 lb 8 oz (119.1 kg)  Height: 5\' 5"  (1.651 m)    GEN:  The patient appears stated age and is in NAD. HEENT:  Normocephalic, atraumatic.  The mucous membranes are dry. The superficial temporal arteries are without ropiness or tenderness. CV:  RRR Lungs:  CTAB Neck/HEME:  There are no carotid bruits bilaterally.  Neurological examination:  Orientation: The patient is alert.  She was able to correctly identify the month and year.  She states that it is the 21st and it is the 22nd.  She does look to her daughter for the finer aspects of the history.  Attention and concentration were normal.  She has trouble copying a shape, more because of tremor than anything. Cranial nerves: There is good facial symmetry. Pupils are equal round and reactive to light bilaterally.  Fundoscopic exam is attempted but the disc margins are not well visualized bilaterally.Extraocular muscles are intact. The visual fields are full to confrontational testing. The speech is fluent and clear. Soft palate rises symmetrically and there is no tongue deviation. Hearing is intact to conversational tone. Sensation: Sensation is intact to light and pinprick throughout (facial, trunk, extremities). Vibration is intact at the bilateral big toe.  There is no extinction with double simultaneous stimulation. There is no sensory dermatomal level identified. Motor: Strength is 5/5 in the bilateral upper and lower extremities.   Shoulder shrug is equal and symmetric.  There is no pronator drift. Deep tendon reflexes: Deep tendon reflexes are 2/4 at the  bilateral biceps, triceps, brachioradialis, 1/4 at the bilateral patella and achilles. Plantar responses are downgoing bilaterally.  Movement examination: Tone: There is normal tone in the bilateral upper extremities.  The tone in the lower extremities is normal.  Abnormal movements: There is no rest tremor, even with distraction procedures.  There is mild tremor of the outstretched hands.  This is improved with a weight.  There is just mild intention tremor.  She has more trouble with Archimedes spirals with the left hand than the right.  She has just mild tremor when asked to pour water from one glass to another.  She does not spill a significant amount. Coordination:  There is no decremation with RAM's, with any form of RAMS, including alternating supination and pronation of the forearm, hand opening and closing, finger taps, heel taps and toe taps. Gait and Station: The patient has a very antalgic gait.  She is wide-based.     Labs: Reviewed lab work from primary care.  On August 26, 2018 the patient's total cholesterol was 174 with HDL 56, LDL 103.  Sodium was 143, potassium 4.5, chloride 103, CO2 27, BUN 29 and creatinine 1.31.  AST 29, ALT 30, alkaline phosphatase 157.  Hemoglobin A1c was 6.7.  CBC was completed on July 05, 2018.  White blood cells were 5.7, hemoglobin 12.0, hematocrit 36.7 and platelets 171.    Addendum: TSH was completed on May 24, 2018 and was 3.170.  ASSESSMENT/PLAN:  1.  Essential tremor  -Talk to her about the nature and pathophysiology of essential tremor.  She was on primidone and it helped.  She was taken off of it, presumably because of the interaction with Eliquis.  I would agree that she should not be on these medications together.  Perhaps of her Eliquis could be changed to something like Pradaxa then she could restart the primidone.  I would leave this to her cardiologist.  She has an appointment with her next month.  Otherwise, I told her that her options are  very limited.  She is already on a beta-blocker.  I do not think second line drugs would be a good idea for her.  -I did reassure her that I saw no evidence of Parkinson's disease.  Her daughter states that she was told that she had parkinsonism.  I see no evidence of that.  Reassurance was provided.  -We talked about nonmedicinal options, including weighted gloves.  We talked about readi steadi.  2.  Follow-up as needed.  Much greater than 50% of this visit was spent in counseling and coordinating care.  Total face to face time:  45 min  Cc:  Richardean Chimera, MD

## 2018-09-13 ENCOUNTER — Encounter: Payer: Self-pay | Admitting: Neurology

## 2018-09-13 ENCOUNTER — Ambulatory Visit: Payer: Medicare Other | Admitting: Neurology

## 2018-09-13 VITALS — BP 120/70 | HR 68 | Ht 65.0 in | Wt 262.5 lb

## 2018-09-13 DIAGNOSIS — G250 Essential tremor: Secondary | ICD-10-CM | POA: Diagnosis not present

## 2018-09-13 NOTE — Patient Instructions (Addendum)
Follow up with your cardiologist and see if it is possible to take Pradaxa instead of Eliquis so that you can get back on primidone.  If not, you cannot take primidone as it interacts with the eliquis.    You DO NOT have Parkinsons disease or Parkinsonism.  You have Essential Tremor.

## 2018-10-12 ENCOUNTER — Ambulatory Visit: Payer: Medicare Other | Admitting: Cardiovascular Disease

## 2018-12-13 ENCOUNTER — Telehealth: Payer: Self-pay | Admitting: Cardiovascular Disease

## 2018-12-13 NOTE — Telephone Encounter (Signed)
Clearance was faxed yesterday at 441pm - spoke with [facility] who confirmed they did receive it

## 2018-12-13 NOTE — Telephone Encounter (Signed)
Calling to see if fax they sent yesterday has been completed.  They are needing to know when patient can come off medications to have teeth extracted.

## 2019-01-05 ENCOUNTER — Encounter: Payer: Self-pay | Admitting: *Deleted

## 2019-01-05 ENCOUNTER — Ambulatory Visit: Payer: Medicare Other | Admitting: Cardiovascular Disease

## 2019-01-05 ENCOUNTER — Ambulatory Visit (HOSPITAL_COMMUNITY)
Admission: RE | Admit: 2019-01-05 | Discharge: 2019-01-05 | Disposition: A | Discharge status: Home / Self Care | Payer: Medicare Other | Source: Ambulatory Visit | Attending: Cardiovascular Disease | Admitting: Cardiovascular Disease

## 2019-01-05 ENCOUNTER — Encounter: Payer: Self-pay | Admitting: Cardiovascular Disease

## 2019-01-05 ENCOUNTER — Encounter

## 2019-01-05 ENCOUNTER — Telehealth: Payer: Self-pay | Admitting: Cardiovascular Disease

## 2019-01-05 ENCOUNTER — Other Ambulatory Visit (HOSPITAL_COMMUNITY)
Admission: RE | Admit: 2019-01-05 | Discharge: 2019-01-05 | Disposition: A | Discharge status: Home / Self Care | Payer: Medicare Other | Source: Ambulatory Visit | Attending: Cardiovascular Disease | Admitting: Cardiovascular Disease

## 2019-01-05 VITALS — BP 105/70 | HR 76 | Ht 65.0 in | Wt 269.2 lb

## 2019-01-05 DIAGNOSIS — I447 Left bundle-branch block, unspecified: Secondary | ICD-10-CM

## 2019-01-05 DIAGNOSIS — R0609 Other forms of dyspnea: Secondary | ICD-10-CM | POA: Insufficient documentation

## 2019-01-05 DIAGNOSIS — I5033 Acute on chronic diastolic (congestive) heart failure: Secondary | ICD-10-CM

## 2019-01-05 DIAGNOSIS — I4819 Other persistent atrial fibrillation: Secondary | ICD-10-CM | POA: Diagnosis not present

## 2019-01-05 DIAGNOSIS — I10 Essential (primary) hypertension: Secondary | ICD-10-CM

## 2019-01-05 LAB — COMPREHENSIVE METABOLIC PANEL
ALK PHOS: 163 U/L — AB (ref 38–126)
ALT: 63 U/L — AB (ref 0–44)
AST: 45 U/L — ABNORMAL HIGH (ref 15–41)
Albumin: 3.6 g/dL (ref 3.5–5.0)
Anion gap: 8 (ref 5–15)
BUN: 29 mg/dL — ABNORMAL HIGH (ref 8–23)
CALCIUM: 8.5 mg/dL — AB (ref 8.9–10.3)
CO2: 25 mmol/L (ref 22–32)
Chloride: 106 mmol/L (ref 98–111)
Creatinine, Ser: 1.39 mg/dL — ABNORMAL HIGH (ref 0.44–1.00)
GFR calc Af Amer: 43 mL/min — ABNORMAL LOW (ref >60)
GFR calc non Af Amer: 37 mL/min — ABNORMAL LOW (ref >60)
Glucose, Bld: 130 mg/dL — ABNORMAL HIGH (ref 70–99)
Potassium: 4.3 mmol/L (ref 3.5–5.1)
Sodium: 139 mmol/L (ref 135–145)
Total Bilirubin: 1 mg/dL (ref 0.3–1.2)
Total Protein: 6.7 g/dL (ref 6.5–8.1)

## 2019-01-05 LAB — CBC
HCT: 40.7 % (ref 36.0–46.0)
Hemoglobin: 12.2 g/dL (ref 12.0–15.0)
MCH: 29.5 pg (ref 26.0–34.0)
MCHC: 30 g/dL (ref 30.0–36.0)
MCV: 98.5 fL (ref 80.0–100.0)
Platelets: 177 10*3/uL (ref 150–400)
RBC: 4.13 MIL/uL (ref 3.87–5.11)
RDW: 15.8 % — AB (ref 11.5–15.5)
WBC: 7.6 10*3/uL (ref 4.0–10.5)
nRBC: 0 % (ref 0.0–0.2)

## 2019-01-05 LAB — BRAIN NATRIURETIC PEPTIDE: B Natriuretic Peptide: 851 pg/mL — ABNORMAL HIGH (ref 0.0–100.0)

## 2019-01-05 NOTE — Telephone Encounter (Signed)
Johnston Ebbs -daughter called wanting to know if recent test results are in . Please call [phone].

## 2019-01-05 NOTE — Telephone Encounter (Signed)
Notes recorded by Lesle Chris, LPN on 5/95/6387 at 4:24 PM EST Daughter Johnston Ebbs) notified & verbalized understanding. ------  Notes recorded by Lesle Chris, LPN on 5/64/3329 at 1:49 PM EST [facility] labs have been requested, waiting to receive. ------  Notes recorded by Laqueta Linden, MD on 01/05/2019 at 12:40 PM EST BNP indicates increased pressures in heart. CKD stage 3 noted. No recent baseline to compare to; await [facility] labs. No anemia.

## 2019-01-05 NOTE — Patient Instructions (Signed)
Medication Instructions:  Continue all current medications.  Labwork: BNP, CMET, CBC (TODAY)  Testing/Procedures:  A chest x-ray takes a picture of the organs and structures inside the chest, including the heart, lungs, and blood vessels. This test can show several things, including, whether the heart is enlarges; whether fluid is building up in the lungs; and whether pacemaker / defibrillator leads are still in place. (TODAY)  Your physician has requested that you have an echocardiogram. Echocardiography is a painless test that uses sound waves to create images of your heart. It provides your doctor with information about the size and shape of your heart and how well your heart's chambers and valves are working. This procedure takes approximately one hour. There are no restrictions for this procedure.  Office will contact with results via phone or letter.    Follow-Up: 3 weeks   Any Other Special Instructions Will Be Listed Below (If Applicable).  If you need a refill on your cardiac medications before your next appointment, please call your pharmacy.

## 2019-01-05 NOTE — Progress Notes (Signed)
SUBJECTIVE: The patient presents for past due follow-up of paroxysmal atrial fibrillation and hypertension.  Event monitoring in March 2016 demonstrated sinus rhythm and sinus bradycardia with no evidence of atrial fibrillation and no symptoms were reported.  Echocardiogram in January 2016 demonstrated normal left ventricular systolic function, LVEF 50-55%.  She has a chronic left bundle branch block.  She is here with her daughter, Toniann Fail.  It appears she was hospitalized at [facility] rocking him in December 2019 for what sounds like pneumonia and possibly CHF.  She has been seeing her PCP fairly frequently.  She reportedly had a chest x-ray done at that office visit on 12/23/2017.  When he was initially told by the technician that there was fluid in the lungs but then was called and told there was not.  She takes Lasix 80 mg at 8 AM, 80 mg at 2 PM, and 40 mg at 8 PM.  This was an increase in her diuretic dosage in late January.  The patient said she is not urinating any more frequently after having increased the Lasix dose.  She denies chest pain.  She is markedly short of breath with minimal exertion such as walking to her bathroom.  She denies palpitations.  She denies orthopnea and paroxysmal nocturnal dyspnea.  Toniann Fail prepares foods without salt for her mother.    Review of Systems: As per "subjective", otherwise negative.  Allergies  Allergen Reactions  . Codeine Palpitations  . Latex   . Ace Inhibitors Cough    Current Outpatient Medications  Medication Sig Dispense Refill  . acetaminophen (TYLENOL) 325 MG tablet Take 650 mg by mouth 3 (three) times daily.    Marland Kitchen allopurinol (ZYLOPRIM) 300 MG tablet Take 300 mg daily by mouth.  6  . apixaban (ELIQUIS) 5 MG TABS tablet Take 1 tablet (5 mg total) by mouth 2 (two) times daily. 180 tablet 3  . busPIRone (BUSPAR) 15 MG tablet Take 15 mg by mouth 2 (two) times daily.  6  . DULoxetine (CYMBALTA) 60 MG capsule     . furosemide  (LASIX) 40 MG tablet Take 80 mg by mouth 2 (two) times daily. & 40 mg in the evening    . gabapentin (NEURONTIN) 300 MG capsule Take 300 mg by mouth 3 (three) times daily.    Marland Kitchen levothyroxine (SYNTHROID, LEVOTHROID) 100 MCG tablet Take 100 mcg by mouth daily.    . metoprolol succinate (TOPROL-XL) 50 MG 24 hr tablet TAKE 1 TABLET 2 TIMES A DAY, TAKE WITH OR IMMEDIATELY FOLLOWING A MEAL. 60 tablet 6  . Multiple Vitamins-Minerals (MULTIVITAMINS THER. W/MINERALS) TABS tablet Take 1 tablet by mouth daily.    Marland Kitchen omeprazole (PRILOSEC) 20 MG capsule Take 20 mg by mouth daily.    . polyethylene glycol powder (GLYCOLAX/MIRALAX) powder Take 17 g daily as needed by mouth.  6  . potassium chloride SA (K-DUR,KLOR-CON) 20 MEQ tablet Take 40 mEq by mouth 2 (two) times daily.    . primidone (MYSOLINE) 50 MG tablet Take 50 mg by mouth 3 (three) times daily.     . traZODone (DESYREL) 50 MG tablet Take 50 mg by mouth at bedtime.      No current facility-administered medications for this visit.     Past Medical History:  Diagnosis Date  . Atrial fibrillation (HCC)   . Depression   . Diastolic heart failure    LVEF 55-60%  . Essential hypertension, benign   . GERD (gastroesophageal reflux disease)   .  History of breast cancer   . Hx of atrial fibrillation, no current medication    converted out of A. Fib with cardioversion  . Hypothyroidism   . Left bundle branch block    negative Lexiscan Myoview; EF 56%, 3/13   . Lumbar disc disease   . Meralgia paresthetica, right   . Mixed hyperlipidemia   . Morbid obesity (HCC)   . Nonalcoholic fatty liver disease   . OSA (obstructive sleep apnea)    CPAP machine  . Pulmonary hypertension (HCC)    RVSP 55-60 mm mercury    Past Surgical History:  Procedure Laterality Date  . CATARACT EXTRACTION W/PHACO Left 12/18/2013   Procedure: CATARACT EXTRACTION PHACO AND INTRAOCULAR LENS PLACEMENT (IOC);  Surgeon: Gemma PayorKerry Hunt, MD;  Location: [facility];  Service: Ophthalmology;   Laterality: Left;  CDE 12.70  . CATARACT EXTRACTION, BILATERAL  04/19/2008   Dr. Alto DenverHunt  . FEMORAL EXPLORATION  12/2003   RIGHT LATERAL FEMORAL CUTANEOUS NERVE/Dr. Channing Muttersoy  . HEEL SPUR SURGERY  07/2006   For spurs/Dr. Ulice Brilliantrake  . INCISION AND DRAINAGE OF WOUND  01/26/2012   Dr. Chaney MallingMortenson  . KNEE ARTHROSCOPY  10/2005   Left knee/from torn cartilage Dr. Edger HouseMcGinley  . LAPAROSCOPIC CHOLECYSTECTOMY  03/02/2011   Dr. Gabriel CirrieMason  . LEFT HEART CATHETERIZATION WITH CORONARY ANGIOGRAM N/A 12/20/2014   Procedure: LEFT HEART CATHETERIZATION WITH CORONARY ANGIOGRAM;  Surgeon: Peter M SwazilandJordan, MD;  [facility];  Service: Cardiovascular;  Laterality: N/A;  . MASTECTOMY, RADICAL Left 1989   With chemotherapy  . PARS PLANA VITRECTOMY W/ REPAIR OF MACULAR HOLE  05/2008   Dr. Luciana Axeankin  . TOTAL KNEE ARTHROPLASTY Left 12/29/2011   Dr. Chaney MallingMortenson  . TUBAL LIGATION     BILATERAL    Social History   Socioeconomic History  . Marital status: Married    Spouse name: Not on file  . Number of children: 1  . Years of education: 1412  . Highest education level: High school graduate  Occupational History  . Occupation: retired Engineer, petroleumcafeteria worker  Social Needs  . Financial resource strain: Not on file  . Food insecurity:    Worry: Not on file    Inability: Not on file  . Transportation needs:    Medical: Not on file    Non-medical: Not on file  Tobacco Use  . Smoking status: Former Smoker    Packs/day: 0.50    Years: 10.00    Pack years: 5.00    Types: Cigarettes    Start date: 06/03/1965    Last attempt to quit: 11/24/1987    Years since quitting: 31.1  . Smokeless tobacco: Never Used  Substance and Sexual Activity  . Alcohol use: No    Alcohol/week: 0.0 standard drinks  . Drug use: No  . Sexual activity: Never  Lifestyle  . Physical activity:    Days per week: Not on file    Minutes per session: Not on file  . Stress: Not on file  Relationships  . Social connections:    Talks on phone: Not on file     Gets together: Not on file    Attends religious service: Not on file    Active member of club or organization: Not on file    Attends meetings of clubs or organizations: Not on file    Relationship status: Not on file  . Intimate partner violence:    Fear of current or ex partner: Not on file    Emotionally abused: Not  on file    Physically abused: Not on file    Forced sexual activity: Not on file  Other Topics Concern  . Not on file  Social History Narrative   Has 1 daughter that lives with her in a one story home.  Retired Armed forces training and education officer.  Education: high school.      Vitals:   01/05/19 1019  BP: 105/70  Pulse: 76  SpO2: 95%  Weight: 269 lb 3.2 oz (122.1 kg)  Height: 5\' 5"  (1.651 m)    Wt Readings from Last 3 Encounters:  01/05/19 269 lb 3.2 oz (122.1 kg)  09/13/18 262 lb 8 oz (119.1 kg)  09/27/17 261 lb 6.4 oz (118.6 kg)     PHYSICAL EXAM General: NAD HEENT: Normal. Neck: No JVD, no thyromegaly. Lungs: Faint bibasilar rales. CV: Regular rate and irregular rhythm, normal S1/split S2, no S3, no murmur.  Trivial bilateral lower extremity edema.   Abdomen: Soft, nontender, no distention.  Neurologic: Alert and oriented.  Psych: Normal affect. Skin: Normal. Musculoskeletal: No gross deformities.    ECG: Reviewed above under Subjective   Labs: Lab Results  Component Value Date/Time   K 3.8 12/19/2014 06:45 AM   BUN 24 (H) 12/19/2014 06:45 AM   CREATININE 1.14 (H) 12/19/2014 06:45 AM   HGB 11.8 (L) 12/20/2014 04:17 AM     Lipids: Lab Results  Component Value Date/Time   LDLCALC 135 (H) 12/19/2014 06:45 AM   CHOL 190 12/19/2014 06:45 AM   TRIG 74 12/19/2014 06:45 AM   HDL 40 12/19/2014 06:45 AM       ASSESSMENT AND PLAN: 1.  Persistent atrial fibrillation: Heart rate is controlled on Toprol-XL.  Anticoagulated with Eliquis.  No changes to therapy.  2.  Hypertension: Controlled on present therapy.  No changes.  3.  Left bundle branch  block: This is chronic.  4.  Dyspnea on exertion/acute on chronic diastolic heart failure: She is markedly short of breath with minimal exertion.  She takes Lasix 80 mg at 8 AM, 80 mg at 2 PM, and 40 mg at 8 PM.  I will obtain a chest x-ray today along with a BNP, comprehensive metabolic panel, and CBC.  She may have become refractory to Lasix and I will likely switch to torsemide.  I will also obtain hospitalization records from [facility] from December 2019. I will order a 2-D echocardiogram with Doppler to evaluate cardiac structure, function, and regional wall motion.    Disposition: Follow up 3 weeks  A high level of decision making was required for increased medical complexities.    Prentice Docker, M.D., F.A.C.C.

## 2019-01-06 ENCOUNTER — Telehealth: Payer: Self-pay | Admitting: Cardiovascular Disease

## 2019-01-06 DIAGNOSIS — I480 Paroxysmal atrial fibrillation: Secondary | ICD-10-CM

## 2019-01-06 MED ORDER — METOLAZONE 2.5 MG PO TABS: ORAL_TABLET | ORAL | 0 refills | Status: DC

## 2019-01-06 MED ORDER — TORSEMIDE 20 MG PO TABS: ORAL_TABLET | ORAL | 1 refills | Status: DC

## 2019-01-06 NOTE — Telephone Encounter (Signed)
Patients daughter called stating that patient woke up today she stated that she is having a hard time with her breathing.   Please call [phone].

## 2019-01-06 NOTE — Telephone Encounter (Signed)
Pt daughter Toniann Fail [facility]) says pt c/o SOB/chest pain starting this morning - she is going to take pt to [facility] ED - also voiced understanding of CXR results and medication changes - updated medication list   Laqueta Linden, MD sent to Lesle Chris, LPN        She has mild CHF. I will prescribe metolazone 2.5 mg daily for the next 2 days. I will discontinue Lasix and start torsemide 40 mg twice daily for 3 days, and then 20 mg twice daily going forward. Continue potassium supplementation. Obtain a basic metabolic panel on Monday, 01/09/2019. Await results of echocardiogram.

## 2019-01-09 ENCOUNTER — Telehealth: Payer: Self-pay | Admitting: Cardiovascular Disease

## 2019-01-09 ENCOUNTER — Other Ambulatory Visit (HOSPITAL_COMMUNITY)
Admission: RE | Admit: 2019-01-09 | Discharge: 2019-01-09 | Disposition: A | Discharge status: Home / Self Care | Payer: Medicare Other | Source: Ambulatory Visit | Attending: Cardiovascular Disease | Admitting: Cardiovascular Disease

## 2019-01-09 DIAGNOSIS — R0602 Shortness of breath: Secondary | ICD-10-CM

## 2019-01-09 DIAGNOSIS — I5032 Chronic diastolic (congestive) heart failure: Secondary | ICD-10-CM

## 2019-01-09 LAB — BASIC METABOLIC PANEL
Anion gap: 11 (ref 5–15)
BUN: 30 mg/dL — ABNORMAL HIGH (ref 8–23)
CHLORIDE: 101 mmol/L (ref 98–111)
CO2: 25 mmol/L (ref 22–32)
CREATININE: 1.42 mg/dL — AB (ref 0.44–1.00)
Calcium: 8.5 mg/dL — ABNORMAL LOW (ref 8.9–10.3)
GFR calc Af Amer: 42 mL/min — ABNORMAL LOW (ref >60)
GFR calc non Af Amer: 36 mL/min — ABNORMAL LOW (ref >60)
Glucose, Bld: 131 mg/dL — ABNORMAL HIGH (ref 70–99)
Potassium: 3.3 mmol/L — ABNORMAL LOW (ref 3.5–5.1)
Sodium: 137 mmol/L (ref 135–145)

## 2019-01-09 NOTE — Telephone Encounter (Signed)
Reviewed instructions with daughter Toniann Fail).  Stated that she did not take her to [facility], but did start the medications as instructed and she got to doing some better.  Instructed she needs BMET today.  Order entered.  She will take her to [facility] for labs today.

## 2019-01-09 NOTE — Telephone Encounter (Signed)
Daughter Toniann Fail) called in regards to phone call on 01/06/2019. She needs to clarify instructions.

## 2019-01-11 ENCOUNTER — Telehealth: Payer: Self-pay | Admitting: *Deleted

## 2019-01-11 DIAGNOSIS — I5032 Chronic diastolic (congestive) heart failure: Secondary | ICD-10-CM

## 2019-01-11 DIAGNOSIS — E876 Hypokalemia: Secondary | ICD-10-CM

## 2019-01-11 NOTE — Telephone Encounter (Signed)
Notes recorded by Laurine Blazer, LPN on 12/03/347 at 6:11 PM EST Daughter Abigail Butts) notified. Will mail lab order ------  Notes recorded by Imogene Burn, PA-C on 01/09/2019 at 3:04 PM EST Potassium low at 3.3. Please asked patient to take K. Dur 40 mEq extra today in addition to the twice a day that she already takes. Kidney function up a little bit most likely due to the extra diuretics Dr. Bronson Ing gave her. Repeat be met in 2 weeks.

## 2019-01-12 ENCOUNTER — Ambulatory Visit (INDEPENDENT_AMBULATORY_CARE_PROVIDER_SITE_OTHER): Payer: Medicare Other

## 2019-01-12 DIAGNOSIS — R0609 Other forms of dyspnea: Secondary | ICD-10-CM | POA: Diagnosis not present

## 2019-01-13 ENCOUNTER — Telehealth: Payer: Self-pay | Admitting: Cardiovascular Disease

## 2019-01-13 NOTE — Telephone Encounter (Signed)
Wanting test results

## 2019-01-13 NOTE — Telephone Encounter (Signed)
-----   Message from Antoine Poche, MD sent at 01/13/2019 12:43 PM EST ----- Echo completed yesterday shows some mild decrease in the pumping function of her heart which could be contributing to her symptoms, will be discussed in detail at her upcoming follow up as well as discussed if further medication changes or tests are needed   Dominga Ferry MD

## 2019-01-13 NOTE — Telephone Encounter (Signed)
Daughter informed and verbalized understanding of plan. Copy sent to PCP. 

## 2019-01-16 ENCOUNTER — Emergency Department (HOSPITAL_COMMUNITY): Payer: Medicare Other

## 2019-01-16 ENCOUNTER — Inpatient Hospital Stay (HOSPITAL_COMMUNITY)
Admission: EM | Admit: 2019-01-16 | Discharge: 2019-01-20 | DRG: 286 | Disposition: A | Discharge status: Home / Self Care | Payer: Medicare Other | Attending: Internal Medicine | Admitting: Internal Medicine

## 2019-01-16 ENCOUNTER — Encounter (HOSPITAL_COMMUNITY): Payer: Self-pay | Admitting: Emergency Medicine

## 2019-01-16 ENCOUNTER — Telehealth: Payer: Self-pay | Admitting: Cardiovascular Disease

## 2019-01-16 ENCOUNTER — Other Ambulatory Visit: Payer: Self-pay

## 2019-01-16 DIAGNOSIS — I447 Left bundle-branch block, unspecified: Secondary | ICD-10-CM | POA: Diagnosis present

## 2019-01-16 DIAGNOSIS — Z91040 Latex allergy status: Secondary | ICD-10-CM

## 2019-01-16 DIAGNOSIS — Z87891 Personal history of nicotine dependence: Secondary | ICD-10-CM

## 2019-01-16 DIAGNOSIS — K760 Fatty (change of) liver, not elsewhere classified: Secondary | ICD-10-CM | POA: Diagnosis present

## 2019-01-16 DIAGNOSIS — Z6841 Body Mass Index (BMI) 40.0 and over, adult: Secondary | ICD-10-CM

## 2019-01-16 DIAGNOSIS — Z853 Personal history of malignant neoplasm of breast: Secondary | ICD-10-CM

## 2019-01-16 DIAGNOSIS — E875 Hyperkalemia: Secondary | ICD-10-CM | POA: Diagnosis present

## 2019-01-16 DIAGNOSIS — E782 Mixed hyperlipidemia: Secondary | ICD-10-CM | POA: Diagnosis present

## 2019-01-16 DIAGNOSIS — N183 Chronic kidney disease, stage 3 (moderate): Secondary | ICD-10-CM | POA: Diagnosis present

## 2019-01-16 DIAGNOSIS — Z79890 Hormone replacement therapy (postmenopausal): Secondary | ICD-10-CM

## 2019-01-16 DIAGNOSIS — E039 Hypothyroidism, unspecified: Secondary | ICD-10-CM | POA: Diagnosis present

## 2019-01-16 DIAGNOSIS — I2781 Cor pulmonale (chronic): Secondary | ICD-10-CM | POA: Diagnosis present

## 2019-01-16 DIAGNOSIS — Z9012 Acquired absence of left breast and nipple: Secondary | ICD-10-CM

## 2019-01-16 DIAGNOSIS — Z803 Family history of malignant neoplasm of breast: Secondary | ICD-10-CM

## 2019-01-16 DIAGNOSIS — R0789 Other chest pain: Secondary | ICD-10-CM

## 2019-01-16 DIAGNOSIS — Z96652 Presence of left artificial knee joint: Secondary | ICD-10-CM | POA: Diagnosis present

## 2019-01-16 DIAGNOSIS — G4733 Obstructive sleep apnea (adult) (pediatric): Secondary | ICD-10-CM | POA: Diagnosis present

## 2019-01-16 DIAGNOSIS — F329 Major depressive disorder, single episode, unspecified: Secondary | ICD-10-CM | POA: Diagnosis present

## 2019-01-16 DIAGNOSIS — I083 Combined rheumatic disorders of mitral, aortic and tricuspid valves: Secondary | ICD-10-CM | POA: Diagnosis present

## 2019-01-16 DIAGNOSIS — I480 Paroxysmal atrial fibrillation: Secondary | ICD-10-CM | POA: Diagnosis present

## 2019-01-16 DIAGNOSIS — I5043 Acute on chronic combined systolic (congestive) and diastolic (congestive) heart failure: Secondary | ICD-10-CM | POA: Diagnosis present

## 2019-01-16 DIAGNOSIS — I10 Essential (primary) hypertension: Secondary | ICD-10-CM | POA: Diagnosis present

## 2019-01-16 DIAGNOSIS — I2510 Atherosclerotic heart disease of native coronary artery without angina pectoris: Secondary | ICD-10-CM | POA: Diagnosis present

## 2019-01-16 DIAGNOSIS — Z888 Allergy status to other drugs, medicaments and biological substances status: Secondary | ICD-10-CM

## 2019-01-16 DIAGNOSIS — I130 Hypertensive heart and chronic kidney disease with heart failure and stage 1 through stage 4 chronic kidney disease, or unspecified chronic kidney disease: Secondary | ICD-10-CM | POA: Diagnosis not present

## 2019-01-16 DIAGNOSIS — K219 Gastro-esophageal reflux disease without esophagitis: Secondary | ICD-10-CM | POA: Diagnosis present

## 2019-01-16 DIAGNOSIS — R0600 Dyspnea, unspecified: Secondary | ICD-10-CM | POA: Diagnosis present

## 2019-01-16 DIAGNOSIS — Z7901 Long term (current) use of anticoagulants: Secondary | ICD-10-CM

## 2019-01-16 DIAGNOSIS — Z9049 Acquired absence of other specified parts of digestive tract: Secondary | ICD-10-CM

## 2019-01-16 DIAGNOSIS — R0609 Other forms of dyspnea: Secondary | ICD-10-CM

## 2019-01-16 DIAGNOSIS — Z79899 Other long term (current) drug therapy: Secondary | ICD-10-CM

## 2019-01-16 DIAGNOSIS — I509 Heart failure, unspecified: Secondary | ICD-10-CM

## 2019-01-16 DIAGNOSIS — Z9221 Personal history of antineoplastic chemotherapy: Secondary | ICD-10-CM

## 2019-01-16 DIAGNOSIS — Z885 Allergy status to narcotic agent status: Secondary | ICD-10-CM

## 2019-01-16 DIAGNOSIS — Z9119 Patient's noncompliance with other medical treatment and regimen: Secondary | ICD-10-CM

## 2019-01-16 DIAGNOSIS — I2729 Other secondary pulmonary hypertension: Secondary | ICD-10-CM | POA: Diagnosis present

## 2019-01-16 HISTORY — DX: Atherosclerotic heart disease of native coronary artery without angina pectoris: I25.10

## 2019-01-16 LAB — CBC WITH DIFFERENTIAL/PLATELET
Abs Immature Granulocytes: 0.03 10*3/uL (ref 0.00–0.07)
Basophils Absolute: 0 10*3/uL (ref 0.0–0.1)
Basophils Relative: 0 %
Eosinophils Absolute: 0.1 10*3/uL (ref 0.0–0.5)
Eosinophils Relative: 1 %
HCT: 39.2 % (ref 36.0–46.0)
Hemoglobin: 11.5 g/dL — ABNORMAL LOW (ref 12.0–15.0)
Immature Granulocytes: 1 %
Lymphocytes Relative: 22 %
Lymphs Abs: 1.3 10*3/uL (ref 0.7–4.0)
MCH: 28.5 pg (ref 26.0–34.0)
MCHC: 29.3 g/dL — ABNORMAL LOW (ref 30.0–36.0)
MCV: 97.3 fL (ref 80.0–100.0)
Monocytes Absolute: 0.6 10*3/uL (ref 0.1–1.0)
Monocytes Relative: 9 %
Neutro Abs: 3.9 10*3/uL (ref 1.7–7.7)
Neutrophils Relative %: 67 %
PLATELETS: 152 10*3/uL (ref 150–400)
RBC: 4.03 MIL/uL (ref 3.87–5.11)
RDW: 16.2 % — ABNORMAL HIGH (ref 11.5–15.5)
WBC: 5.9 10*3/uL (ref 4.0–10.5)
nRBC: 0 % (ref 0.0–0.2)

## 2019-01-16 LAB — COMPREHENSIVE METABOLIC PANEL
ALT: 52 U/L — ABNORMAL HIGH (ref 0–44)
AST: 42 U/L — ABNORMAL HIGH (ref 15–41)
Albumin: 3.5 g/dL (ref 3.5–5.0)
Alkaline Phosphatase: 172 U/L — ABNORMAL HIGH (ref 38–126)
Anion gap: 9 (ref 5–15)
BUN: 40 mg/dL — ABNORMAL HIGH (ref 8–23)
CO2: 22 mmol/L (ref 22–32)
CREATININE: 1.34 mg/dL — AB (ref 0.44–1.00)
Calcium: 8.5 mg/dL — ABNORMAL LOW (ref 8.9–10.3)
Chloride: 105 mmol/L (ref 98–111)
GFR calc Af Amer: 45 mL/min — ABNORMAL LOW (ref >60)
GFR, EST NON AFRICAN AMERICAN: 39 mL/min — AB (ref >60)
Glucose, Bld: 110 mg/dL — ABNORMAL HIGH (ref 70–99)
Potassium: 4.4 mmol/L (ref 3.5–5.1)
Sodium: 136 mmol/L (ref 135–145)
Total Bilirubin: 0.8 mg/dL (ref 0.3–1.2)
Total Protein: 6.3 g/dL — ABNORMAL LOW (ref 6.5–8.1)

## 2019-01-16 LAB — URINALYSIS, ROUTINE W REFLEX MICROSCOPIC
Bilirubin Urine: NEGATIVE
Glucose, UA: NEGATIVE mg/dL
Hgb urine dipstick: NEGATIVE
Ketones, ur: NEGATIVE mg/dL
LEUKOCYTE UA: NEGATIVE
Nitrite: NEGATIVE
Protein, ur: NEGATIVE mg/dL
SPECIFIC GRAVITY, URINE: 1.009 (ref 1.005–1.030)
pH: 6 (ref 5.0–8.0)

## 2019-01-16 LAB — TROPONIN I
Troponin I: <0.03 ng/mL (ref <0.03)
Troponin I: <0.03 ng/mL (ref <0.03)
Troponin I: <0.03 ng/mL (ref <0.03)

## 2019-01-16 LAB — MAGNESIUM: Magnesium: 2.2 mg/dL (ref 1.7–2.4)

## 2019-01-16 LAB — TSH: TSH: 1.768 u[IU]/mL (ref 0.350–4.500)

## 2019-01-16 LAB — T4, FREE: FREE T4: 0.92 ng/dL (ref 0.82–1.77)

## 2019-01-16 LAB — BRAIN NATRIURETIC PEPTIDE: B Natriuretic Peptide: 700 pg/mL — ABNORMAL HIGH (ref 0.0–100.0)

## 2019-01-16 MED ORDER — BUSPIRONE HCL 5 MG PO TABS
15.0000 mg | ORAL_TABLET | Freq: Two times a day (BID) | ORAL | Status: DC
  Administered 2019-01-16 – 2019-01-20 (×8): 15 mg via ORAL
  Filled 2019-01-16 (×8): qty 3

## 2019-01-16 MED ORDER — METOPROLOL SUCCINATE ER 50 MG PO TB24
50.0000 mg | ORAL_TABLET | Freq: Every day | ORAL | Status: DC
  Administered 2019-01-17 – 2019-01-20 (×4): 50 mg via ORAL
  Filled 2019-01-16 (×4): qty 1

## 2019-01-16 MED ORDER — ACETAMINOPHEN 325 MG PO TABS
650.0000 mg | ORAL_TABLET | Freq: Four times a day (QID) | ORAL | Status: DC | PRN
  Administered 2019-01-16 – 2019-01-20 (×11): 650 mg via ORAL
  Filled 2019-01-16 (×11): qty 2

## 2019-01-16 MED ORDER — POLYETHYLENE GLYCOL 3350 17 G PO PACK: 17.0000 g | PACK | Freq: Every day | ORAL | Status: DC | PRN

## 2019-01-16 MED ORDER — POTASSIUM CHLORIDE CRYS ER 20 MEQ PO TBCR
40.0000 meq | EXTENDED_RELEASE_TABLET | Freq: Two times a day (BID) | ORAL | Status: DC
  Administered 2019-01-16 – 2019-01-18 (×5): 40 meq via ORAL
  Filled 2019-01-16 (×5): qty 2

## 2019-01-16 MED ORDER — ONDANSETRON HCL 4 MG/2ML IJ SOLN: 4.0000 mg | Freq: Four times a day (QID) | INTRAMUSCULAR | Status: DC | PRN

## 2019-01-16 MED ORDER — GABAPENTIN 300 MG PO CAPS
300.0000 mg | ORAL_CAPSULE | Freq: Three times a day (TID) | ORAL | Status: DC
  Administered 2019-01-16 – 2019-01-20 (×11): 300 mg via ORAL
  Filled 2019-01-16 (×11): qty 1

## 2019-01-16 MED ORDER — PRIMIDONE 50 MG PO TABS
50.0000 mg | ORAL_TABLET | Freq: Two times a day (BID) | ORAL | Status: DC
  Administered 2019-01-16 – 2019-01-20 (×8): 50 mg via ORAL
  Filled 2019-01-16 (×8): qty 1

## 2019-01-16 MED ORDER — ALLOPURINOL 300 MG PO TABS
300.0000 mg | ORAL_TABLET | Freq: Every day | ORAL | Status: DC
  Administered 2019-01-17 – 2019-01-20 (×4): 300 mg via ORAL
  Filled 2019-01-16 (×4): qty 1

## 2019-01-16 MED ORDER — ONDANSETRON HCL 4 MG PO TABS: 4.0000 mg | ORAL_TABLET | Freq: Four times a day (QID) | ORAL | Status: DC | PRN

## 2019-01-16 MED ORDER — LEVOTHYROXINE SODIUM 100 MCG PO TABS
100.0000 ug | ORAL_TABLET | Freq: Every day | ORAL | Status: DC
  Administered 2019-01-17 – 2019-01-20 (×4): 100 ug via ORAL
  Filled 2019-01-16 (×4): qty 1

## 2019-01-16 MED ORDER — APIXABAN 5 MG PO TABS
5.0000 mg | ORAL_TABLET | Freq: Two times a day (BID) | ORAL | Status: DC
  Administered 2019-01-16: 5 mg via ORAL
  Filled 2019-01-16: qty 1

## 2019-01-16 MED ORDER — TRAZODONE HCL 50 MG PO TABS
50.0000 mg | ORAL_TABLET | Freq: Every day | ORAL | Status: DC
  Administered 2019-01-16 – 2019-01-19 (×4): 50 mg via ORAL
  Filled 2019-01-16 (×4): qty 1

## 2019-01-16 MED ORDER — ACETAMINOPHEN 650 MG RE SUPP: 650.0000 mg | Freq: Four times a day (QID) | RECTAL | Status: DC | PRN

## 2019-01-16 MED ORDER — FUROSEMIDE 10 MG/ML IJ SOLN
40.0000 mg | Freq: Two times a day (BID) | INTRAMUSCULAR | Status: DC
  Administered 2019-01-16: 40 mg via INTRAVENOUS
  Filled 2019-01-16: qty 4

## 2019-01-16 MED ORDER — PANTOPRAZOLE SODIUM 40 MG PO TBEC
40.0000 mg | DELAYED_RELEASE_TABLET | Freq: Every day | ORAL | Status: DC
  Administered 2019-01-17 – 2019-01-20 (×4): 40 mg via ORAL
  Filled 2019-01-16 (×4): qty 1

## 2019-01-16 NOTE — Telephone Encounter (Signed)
Daughter called stating that he mother is having difficulty breathing and will not go to the ED.   Wondering if meds need to be adjusted again

## 2019-01-16 NOTE — ED Triage Notes (Signed)
Patient complaining of shortness of breath and chest tightness x 2 weeks. Also complaining of coughing up "sand colored" sputum.

## 2019-01-16 NOTE — ED Notes (Signed)
PT provided with dinner tray

## 2019-01-16 NOTE — ED Provider Notes (Signed)
[facility] EMERGENCY DEPARTMENT Provider Note   CSN: [id number] Arrival date & time: 01/16/19  1217    History   Chief Complaint Chief Complaint  Patient presents with  . Shortness of Breath    HPI Leslie Alexander is a 78 y.o. female.     HPI Patient with a history of diastolic heart failure and atrial fibrillation presents with worsening dyspnea with minimal exertion and central chest tightness for the past 2 weeks.  Denies cough.  Does have mild lower extremity swelling.  No fever or chills.  States she has been compliant with her medications. Past Medical History:  Diagnosis Date  . Atrial fibrillation (HCC)   . Depression   . Diastolic heart failure    LVEF 55-60%  . Essential hypertension, benign   . GERD (gastroesophageal reflux disease)   . History of breast cancer   . Hx of atrial fibrillation, no current medication    converted out of A. Fib with cardioversion  . Hypothyroidism   . Left bundle branch block    negative Lexiscan Myoview; EF 56%, 3/13   . Lumbar disc disease   . Meralgia paresthetica, right   . Mixed hyperlipidemia   . Morbid obesity (HCC)   . Nonalcoholic fatty liver disease   . OSA (obstructive sleep apnea)    CPAP machine  . Pulmonary hypertension (HCC)    RVSP 55-60 mm mercury    Patient Active Problem List   Diagnosis Date Noted  . Pain in the chest   . Chest pain   . Essential hypertension   . LBBB (left bundle branch block)   . PAF (paroxysmal atrial fibrillation) (HCC)   . Chronic diastolic (congestive) heart failure (HCC)   . Left bundle branch block   . Mixed hyperlipidemia     Past Surgical History:  Procedure Laterality Date  . CATARACT EXTRACTION W/PHACO Left 12/18/2013   Procedure: CATARACT EXTRACTION PHACO AND INTRAOCULAR LENS PLACEMENT (IOC);  Surgeon: Gemma Payor, MD;  Location: [facility];  Service: Ophthalmology;  Laterality: Left;  CDE 12.70  . CATARACT EXTRACTION, BILATERAL  04/19/2008   Dr. Alto Denver  . FEMORAL  EXPLORATION  12/2003   RIGHT LATERAL FEMORAL CUTANEOUS NERVE/Dr. Channing Mutters  . HEEL SPUR SURGERY  07/2006   For spurs/Dr. Ulice Brilliant  . INCISION AND DRAINAGE OF WOUND  01/26/2012   Dr. Chaney Malling  . KNEE ARTHROSCOPY  10/2005   Left knee/from torn cartilage Dr. Edger House  . LAPAROSCOPIC CHOLECYSTECTOMY  03/02/2011   Dr. Gabriel Cirri  . LEFT HEART CATHETERIZATION WITH CORONARY ANGIOGRAM N/A 12/20/2014   Procedure: LEFT HEART CATHETERIZATION WITH CORONARY ANGIOGRAM;  Surgeon: Peter M Swaziland, MD;  Location: [facility] CATH LAB;  Service: Cardiovascular;  Laterality: N/A;  . MASTECTOMY, RADICAL Left 1989   With chemotherapy  . PARS PLANA VITRECTOMY W/ REPAIR OF MACULAR HOLE  05/2008   Dr. Luciana Axe  . TOTAL KNEE ARTHROPLASTY Left 12/29/2011   Dr. Chaney Malling  . TUBAL LIGATION     BILATERAL     OB History   No obstetric history on file.      Home Medications    Prior to Admission medications   Medication Sig Start Date End Date Taking? Authorizing Provider  acetaminophen (TYLENOL) 325 MG tablet Take 650 mg by mouth 3 (three) times daily.   Yes [provider]  allopurinol (ZYLOPRIM) 300 MG tablet Take 300 mg daily by mouth. 09/16/17  Yes [provider]  apixaban (ELIQUIS) 5 MG TABS tablet Take 1 tablet (5 mg  total) by mouth 2 (two) times daily. 01/08/16  Yes Laqueta Linden, MD  busPIRone (BUSPAR) 15 MG tablet Take 15 mg by mouth 2 (two) times daily. 08/29/18  Yes [provider]  DULoxetine (CYMBALTA) 60 MG capsule  07/13/18  Yes [provider]  gabapentin (NEURONTIN) 300 MG capsule Take 300 mg by mouth 3 (three) times daily.   Yes [provider]  levothyroxine (SYNTHROID, LEVOTHROID) 100 MCG tablet Take 100 mcg by mouth daily.   Yes [provider]  metoprolol succinate (TOPROL-XL) 50 MG 24 hr tablet TAKE 1 TABLET 2 TIMES A DAY, TAKE WITH OR IMMEDIATELY FOLLOWING A MEAL. 09/09/15  Yes Laqueta Linden, MD  Multiple Vitamins-Minerals (MULTIVITAMINS  THER. W/MINERALS) TABS tablet Take 1 tablet by mouth daily.   Yes [provider]  omeprazole (PRILOSEC) 20 MG capsule Take 20 mg by mouth daily.   Yes [provider]  polyethylene glycol powder (GLYCOLAX/MIRALAX) powder Take 17 g daily as needed by mouth. 09/16/17  Yes [provider]  potassium chloride SA (K-DUR,KLOR-CON) 20 MEQ tablet Take 40 mEq by mouth 2 (two) times daily.   Yes [provider]  primidone (MYSOLINE) 50 MG tablet Take 50 mg by mouth 3 (three) times daily.    Yes [provider]  torsemide (DEMADEX) 20 MG tablet TAKE 2 TABLETS 2 TIMES DAILY FOR 3 DAYS THEN TAKE 20 MG 2 TIMES DAILY 01/06/19  Yes Laqueta Linden, MD  traZODone (DESYREL) 50 MG tablet Take 50 mg by mouth at bedtime.  07/13/18  Yes [provider]  metolazone (ZAROXOLYN) 2.5 MG tablet TAKE 1 TABLET DAILY FOR 2 DAYS Patient not taking: Reported on 01/16/2019 01/06/19   Laqueta Linden, MD    Family History Family History  Problem Relation Age of Onset  . Other Father        Ulcer disease  . COPD Mother   . Breast cancer Sister     Social History Social History   Tobacco Use  . Smoking status: Former Smoker    Packs/day: 0.50    Years: 10.00    Pack years: 5.00    Types: Cigarettes    Start date: 06/03/1965    Last attempt to quit: 11/24/1987    Years since quitting: 31.1  . Smokeless tobacco: Never Used  Substance Use Topics  . Alcohol use: No    Alcohol/week: 0.0 standard drinks  . Drug use: No     Allergies   Codeine; Latex; and Ace inhibitors   Review of Systems Review of Systems  Constitutional: Negative for chills and fever.  HENT: Negative for sore throat and trouble swallowing.   Respiratory: Positive for chest tightness and shortness of breath. Negative for cough.   Cardiovascular: Positive for leg swelling. Negative for chest pain and palpitations.  Gastrointestinal: Negative for abdominal pain, constipation, diarrhea,  nausea and vomiting.  Genitourinary: Negative for dysuria, flank pain and frequency.  Musculoskeletal: Negative for back pain, myalgias and neck pain.  Skin: Negative for rash and wound.  Neurological: Negative for dizziness, weakness, light-headedness, numbness and headaches.  Psychiatric/Behavioral: The patient is nervous/anxious.   All other systems reviewed and are negative.    Physical Exam Updated Vital Signs BP 138/68   Pulse 71   Temp 97.9 F (36.6 C) (Oral)   Resp 17   SpO2 95%   Physical Exam Vitals signs and nursing note reviewed.  Constitutional:      General: She is not in acute distress.  Appearance: Normal appearance. She is well-developed. She is not ill-appearing.  HENT:     Head: Normocephalic and atraumatic.     Nose: Nose normal.     Mouth/Throat:     Mouth: Mucous membranes are moist.  Eyes:     Extraocular Movements: Extraocular movements intact.     Pupils: Pupils are equal, round, and reactive to light.  Neck:     Musculoskeletal: Normal range of motion and neck supple.  Cardiovascular:     Rate and Rhythm: Normal rate. Rhythm irregular.     Heart sounds: No murmur. No friction rub. No gallop.   Pulmonary:     Effort: Pulmonary effort is normal.     Comments: Few crackles in bilateral bases.  No respiratory distress. Abdominal:     General: Bowel sounds are normal.     Palpations: Abdomen is soft.     Tenderness: There is no abdominal tenderness. There is no right CVA tenderness, left CVA tenderness, guarding or rebound.  Musculoskeletal: Normal range of motion.        General: No tenderness.     Right lower leg: Edema present.     Left lower leg: Edema present.     Comments: 1+ lower extremity pitting edema.  No asymmetry or calf tenderness.  Distal pulses intact.  Skin:    General: Skin is warm and dry.     Capillary Refill: Capillary refill takes less than 2 seconds.     Findings: No erythema or rash.  Neurological:     General: No  focal deficit present.     Mental Status: She is alert and oriented to person, place, and time.  Psychiatric:        Behavior: Behavior normal.     Comments: Mildly anxious appearing      ED Treatments / Results  Labs (all labs ordered are listed, but only abnormal results are displayed) Labs Reviewed  CBC WITH DIFFERENTIAL/PLATELET - Abnormal; Notable for the following components:      Result Value   Hemoglobin 11.5 (*)    MCHC 29.3 (*)    RDW 16.2 (*)    All other components within normal limits  COMPREHENSIVE METABOLIC PANEL - Abnormal; Notable for the following components:   Glucose, Bld 110 (*)    BUN 40 (*)    Creatinine, Ser 1.34 (*)    Calcium 8.5 (*)    Total Protein 6.3 (*)    AST 42 (*)    ALT 52 (*)    Alkaline Phosphatase 172 (*)    GFR calc non Af Amer 39 (*)    GFR calc Af Amer 45 (*)    All other components within normal limits  BRAIN NATRIURETIC PEPTIDE - Abnormal; Notable for the following components:   B Natriuretic Peptide 700.0 (*)    All other components within normal limits  TSH  MAGNESIUM  URINALYSIS, ROUTINE W REFLEX MICROSCOPIC  TROPONIN I  TROPONIN I  T4, FREE    EKG EKG Interpretation  Date/Time:  Monday January 16 2019 12:40:07 EST Ventricular Rate:  66 PR Interval:    QRS Duration: 154 QT Interval:  434 QTC Calculation: 455 R Axis:   -63 Text Interpretation:  Atrial fibrillation Left bundle branch block Confirmed by Loren Racer ([id number]) on 01/16/2019 6:52:17 PM   Radiology Dg Chest 2 View  Result Date: 01/16/2019 CLINICAL DATA:  Chest tightness and shortness of breath today. History of CHF. EXAM: CHEST - 2 VIEW COMPARISON:  Chest x-rays  dated 01/05/2019 and 08/17/2018. FINDINGS: Stable cardiomegaly. Coarse interstitial markings bilaterally, not significantly changed compared to multiple prior studies. No evidence of alveolar pulmonary edema. No confluent opacity to suggest a developing pneumonia. No pleural effusion or  pneumothorax seen. Osseous structures about the chest are unremarkable. IMPRESSION: 1. No acute findings. No evidence of pneumonia or alveolar pulmonary edema. 2. Stable cardiomegaly. 3. Coarse interstitial markings, stable compared to previous exams, most likely chronic mild interstitial edema. Electronically Signed   By: Bary Richard M.D.   On: 01/16/2019 14:22    Procedures Procedures (including critical care time)  Medications Ordered in ED Medications  furosemide (LASIX) injection 40 mg (40 mg Intravenous Given 01/16/19 1734)     Initial Impression / Assessment and Plan / ED Course  I have reviewed the triage vital signs and the nursing notes.  Pertinent labs & imaging results that were available during my care of the patient were reviewed by me and considered in my medical decision making (see chart for details).        Discussed with Dr. Diona Browner.  Recommends inpatient IV diuresis.  States he would start at 40 mg of Lasix twice daily.  Cardiology will consult on patient in the morning.  Asked to have hospitalist admit.  Hospitalist will see patient in the emergency department and admit.  Final Clinical Impressions(s) / ED Diagnoses   Final diagnoses:  Dyspnea on exertion  Atypical chest pain    ED Discharge Orders    None       Loren Racer, MD 01/16/19 [phone]

## 2019-01-16 NOTE — ED Notes (Signed)
Pt given a mouth swab at this time. 

## 2019-01-16 NOTE — Telephone Encounter (Signed)
Daughter Toniann Fail) notified & verbalized understanding.

## 2019-01-16 NOTE — H&P (Signed)
History and Physical    Leslie Alexander JJO:[id number] DOB: Apr 19, 1941 DOA: 01/16/2019  PCP: Richardean Chimera, MD   Patient coming from: Home  I have personally briefly reviewed patient's old medical records in [facility]  Chief Complaint: SOB, chest tightness  HPI: Leslie Alexander is a 78 y.o. female with medical history significant for PAF, Diastolic CHF, LBBB, HTN, presented to the ED with complaints of increasing difficulty breathing and chest tightness of 2 weeks duration.  She also reports productive cough-grayish-brown sputum.  She reports intermittent wheezing also.  Denies chest pain. She denies increasing lower extremity swelling, reports daily weight checks and that she has added about 5 pounds over the past several days.  She reports compliance with her medications including Eliquis and Zammit, but believes to weight gain started when her dose of torsemide was being reduced.  Recent echocardiogram, showed declining cardiac function.  Patient's cardiologist- Dr. Laural Golden recommended patient go to the ED for further evaluation as she may need IV diuresis or cardiac catheterization.  ED Course: Stable vitals.  O2 sats > 93% on RA, troponin X 2 <0.03, 2 view chest x-ray-no acute findings, chronic mild interstitial edema. BNP elevated at 700.  EKG-atrial fibrillation with old left bundle branch block.  ED provider talked to cardiologist- Dr. McDowell,-recommended admission to hospitalist service for inpatient IV diuresis, with Lasix IV 40 twice daily.  Cardiology to see in the morning.  Review of Systems: As per HPI all other systems reviewed and negative.  Past Medical History:  Diagnosis Date  . Atrial fibrillation (HCC)   . Depression   . Diastolic heart failure    LVEF 55-60%  . Essential hypertension, benign   . GERD (gastroesophageal reflux disease)   . History of breast cancer   . Hx of atrial fibrillation, no current medication    converted out of A. Fib with  cardioversion  . Hypothyroidism   . Left bundle branch block    negative Lexiscan Myoview; EF 56%, 3/13   . Lumbar disc disease   . Meralgia paresthetica, right   . Mixed hyperlipidemia   . Morbid obesity (HCC)   . Nonalcoholic fatty liver disease   . OSA (obstructive sleep apnea)    CPAP machine  . Pulmonary hypertension (HCC)    RVSP 55-60 mm mercury    Past Surgical History:  Procedure Laterality Date  . CATARACT EXTRACTION W/PHACO Left 12/18/2013   Procedure: CATARACT EXTRACTION PHACO AND INTRAOCULAR LENS PLACEMENT (IOC);  Surgeon: Gemma Payor, MD;  Location: [facility];  Service: Ophthalmology;  Laterality: Left;  CDE 12.70  . CATARACT EXTRACTION, BILATERAL  04/19/2008   Dr. Alto Denver  . FEMORAL EXPLORATION  12/2003   RIGHT LATERAL FEMORAL CUTANEOUS NERVE/Dr. Channing Mutters  . HEEL SPUR SURGERY  07/2006   For spurs/Dr. Ulice Brilliant  . INCISION AND DRAINAGE OF WOUND  01/26/2012   Dr. Chaney Malling  . KNEE ARTHROSCOPY  10/2005   Left knee/from torn cartilage Dr. Edger House  . LAPAROSCOPIC CHOLECYSTECTOMY  03/02/2011   Dr. Gabriel Cirri  . LEFT HEART CATHETERIZATION WITH CORONARY ANGIOGRAM N/A 12/20/2014   Procedure: LEFT HEART CATHETERIZATION WITH CORONARY ANGIOGRAM;  Surgeon: Peter M Swaziland, MD;  Location[facility] C[facility] LAB;  Service: Cardiovascular;  Laterality: N/A;  . MASTECTOMY, RADICAL Left 1989   With chemotherapy  . PARS PLANA VITRECTOMY W/ REPAIR OF MACULAR HOLE  05/2008   Dr. Luciana Axe  . TOTAL KNEE ARTHROPLASTY Left 12/29/2011   Dr. Chaney Malling  . TUBAL LIGATION  BILATERAL     reports that she quit smoking about 31 years ago. Her smoking use included cigarettes. She started smoking about 53 years ago. She has a 5.00 pack-year smoking history. She has never used smokeless tobacco. She reports that she does not drink alcohol or use drugs.  Allergies  Allergen Reactions  . Codeine Palpitations  . Latex   . Ace Inhibitors Cough    Family History  Problem Relation Age of Onset  . Other Father         Ulcer disease  . COPD Mother   . Breast cancer Sister     Prior to Admission medications   Medication Sig Start Date End Date Taking? Authorizing Provider  acetaminophen (TYLENOL) 325 MG tablet Take 650 mg by mouth 3 (three) times daily.   Yes [provider]  allopurinol (ZYLOPRIM) 300 MG tablet Take 300 mg daily by mouth. 09/16/17  Yes [provider]  apixaban (ELIQUIS) 5 MG TABS tablet Take 1 tablet (5 mg total) by mouth 2 (two) times daily. 01/08/16  Yes Laqueta Linden, MD  busPIRone (BUSPAR) 15 MG tablet Take 15 mg by mouth 2 (two) times daily. 08/29/18  Yes [provider]  DULoxetine (CYMBALTA) 60 MG capsule  07/13/18  Yes [provider]  gabapentin (NEURONTIN) 300 MG capsule Take 300 mg by mouth 3 (three) times daily.   Yes [provider]  levothyroxine (SYNTHROID, LEVOTHROID) 100 MCG tablet Take 100 mcg by mouth daily.   Yes [provider]  metoprolol succinate (TOPROL-XL) 50 MG 24 hr tablet TAKE 1 TABLET 2 TIMES A DAY, TAKE WITH OR IMMEDIATELY FOLLOWING A MEAL. 09/09/15  Yes Laqueta Linden, MD  Multiple Vitamins-Minerals (MULTIVITAMINS THER. W/MINERALS) TABS tablet Take 1 tablet by mouth daily.   Yes [provider]  omeprazole (PRILOSEC) 20 MG capsule Take 20 mg by mouth daily.   Yes [provider]  polyethylene glycol powder (GLYCOLAX/MIRALAX) powder Take 17 g daily as needed by mouth. 09/16/17  Yes [provider]  potassium chloride SA (K-DUR,KLOR-CON) 20 MEQ tablet Take 40 mEq by mouth 2 (two) times daily.   Yes [provider]  primidone (MYSOLINE) 50 MG tablet Take 50 mg by mouth 3 (three) times daily.    Yes [provider]  torsemide (DEMADEX) 20 MG tablet TAKE 2 TABLETS 2 TIMES DAILY FOR 3 DAYS THEN TAKE 20 MG 2 TIMES DAILY 01/06/19  Yes Laqueta Linden, MD  traZODone (DESYREL) 50 MG tablet Take 50 mg by mouth at bedtime.  07/13/18  Yes [provider]    metolazone (ZAROXOLYN) 2.5 MG tablet TAKE 1 TABLET DAILY FOR 2 DAYS Patient not taking: Reported on 01/16/2019 01/06/19   Laqueta Linden, MD    Physical Exam: Vitals:   01/16/19 1630 01/16/19 1645 01/16/19 1700 01/16/19 1730  BP: 126/74  129/74 138/68  Pulse: 71 68 72 71  Resp: 16 18 (!) 27 17  Temp:      TempSrc:      SpO2: 96% 97% 96% 95%    Constitutional: Mild occasional head tremors calm, comfortable Vitals:   01/16/19 1630 01/16/19 1645 01/16/19 1700 01/16/19 1730  BP: 126/74  129/74 138/68  Pulse: 71 68 72 71  Resp: 16 18 (!) 27 17  Temp:      TempSrc:      SpO2: 96% 97% 96% 95%   Eyes: PERRL, lids and conjunctivae normal ENMT: Mucous membranes are moist. Posterior pharynx clear of any  exudate or lesions.  Neck: normal, supple, no masses, no thyromegaly Respiratory: clear to auscultation bilaterally, no wheezing, no crackles. Normal respiratory effort. No accessory muscle use.  Cardiovascular: Regular rate and rhythm, no murmurs / rubs / gallops. Minimal/trace extremity edema-. 2+ pedal pulses. No carotid bruits.  Abdomen: no tenderness, no masses palpated. No hepatosplenomegaly. Bowel sounds positive.  Musculoskeletal: no clubbing / cyanosis. No joint deformity upper and lower extremities. Good ROM, no contractures. Normal muscle tone.  Skin: no rashes, lesions, ulcers. No induration Neurologic: CN 2-12 grossly intact. Strength 5/5 in all 4.  Psychiatric: Normal judgment and insight. Alert and oriented x 3. Normal mood.   Labs on Admission: I have personally reviewed following labs and imaging studies  CBC: Recent Labs  Lab 01/16/19 1328  WBC 5.9  NEUTROABS 3.9  HGB 11.5*  HCT 39.2  MCV 97.3  PLT 152   Basic Metabolic Panel: Recent Labs  Lab 01/16/19 1328  NA 136  K 4.4  CL 105  CO2 22  GLUCOSE 110*  BUN 40*  CREATININE 1.34*  CALCIUM 8.5*  MG 2.2   Liver Function Tests: Recent Labs  Lab 01/16/19 1328  AST 42*  ALT 52*  ALKPHOS 172*   BILITOT 0.8  PROT 6.3*  ALBUMIN 3.5   Cardiac Enzymes: Recent Labs  Lab 01/16/19 1328 01/16/19 1707  TROPONINI <0.03 <0.03   Thyroid Function Tests: Recent Labs    01/16/19 1328  TSH 1.768   Urine analysis:    Component Value Date/Time   COLORURINE YELLOW 01/16/2019 1442   APPEARANCEUR CLEAR 01/16/2019 1442   LABSPEC 1.009 01/16/2019 1442   PHURINE 6.0 01/16/2019 1442   GLUCOSEU NEGATIVE 01/16/2019 1442   HGBUR NEGATIVE 01/16/2019 1442   BILIRUBINUR NEGATIVE 01/16/2019 1442   KETONESUR NEGATIVE 01/16/2019 1442   PROTEINUR NEGATIVE 01/16/2019 1442   UROBILINOGEN 1.0 12/19/2014 0217   NITRITE NEGATIVE 01/16/2019 1442   LEUKOCYTESUR NEGATIVE 01/16/2019 1442    Radiological Exams on Admission: Dg Chest 2 View  Result Date: 01/16/2019 CLINICAL DATA:  Chest tightness and shortness of breath today. History of CHF. EXAM: CHEST - 2 VIEW COMPARISON:  Chest x-rays dated 01/05/2019 and 08/17/2018. FINDINGS: Stable cardiomegaly. Coarse interstitial markings bilaterally, not significantly changed compared to multiple prior studies. No evidence of alveolar pulmonary edema. No confluent opacity to suggest a developing pneumonia. No pleural effusion or pneumothorax seen. Osseous structures about the chest are unremarkable. IMPRESSION: 1. No acute findings. No evidence of pneumonia or alveolar pulmonary edema. 2. Stable cardiomegaly. 3. Coarse interstitial markings, stable compared to previous exams, most likely chronic mild interstitial edema. Electronically Signed   By: Bary Richard M.D.   On: 01/16/2019 14:22    EKG: Independently reviewed.  Atrial fibrillation.  Old left bundle branch block.  Assessment/Plan Active Problems:   Dyspnea   Dyspnea-with activity with chest tightness.  Troponin normal.  EKG unchanged from prior.  X-ray with chronic interstitial findings.  Presentation suggestive more of acute coronary syndrome than fluid overload.  700 mildly improved compared to recent  851, patient had similar symptoms. Recent ECHO shows decline in EF- 2/20- now 40 to 45%, from 11/2014-50 to 55%.  Doubt pulmonary embolism as she reports compliance with Eliquis. - IV lasix 40mg  x 1 given in ED, further Lasix dosing as per cardiology - Follow up Cardiology recs, would benefit from Ischemic evaluation - Trend Trops - EKG a.m - NPO midnight  Paroxysmal A. Fib with Old LBBB-rate controlled 60s to 70s.  Anticoagulated -Continue  home Eliquis -Continue home metoprolol  Systolic CHF-at this time no significant findings of peripheral volume overload, chest x-ray chronic findings, new low EF 40 to 45%.  Chart shows a 7 pound weight gain/change since 08/2018.  - Further diuretic dosing as per cardiology  -Continue home metoprolol, K supplements  HTN-stable. -Continue home metoprolol   DVT prophylaxis: Eliquis Code Status: Full Family Communication: None at bedside Disposition Plan: Per rounding team Consults called: Cardiology Admission status: Obs, tele   Onnie Boer MD [facility]  01/16/2019, 8:31 PM

## 2019-01-16 NOTE — Telephone Encounter (Signed)
Echocardiogram indicates cardiac function has declined.  She may need IV diuresis.  She also may need cardiac catheterization.  Please tell her that I have recommended she go to the [facility] ED for further evaluation.

## 2019-01-16 NOTE — Telephone Encounter (Signed)
Off/on having trouble breathing since last OV.  Says that her breathing is getting worse since her OV.  Only gained maybe a pound.  No c/o chest pain or dizziness.  Stated that she doesn't do any activity at home, only gets up to go to bathroom.  Says that she is noticing more now with just sitting also per c/o of patient.  Daughter has offered her several times to go to ED, but she declines.  Daughter, pmd, PT has had multiple conversations about her getting up to be more mobile & she just will not do.  Thinks that she is declining to go to ED because she thinks they may make her go to Rehab afterwards like her pmd did last time.  Follow up is scheduled for 01/27/19 [place] office.

## 2019-01-17 ENCOUNTER — Encounter (HOSPITAL_COMMUNITY): Payer: Self-pay | Admitting: Student

## 2019-01-17 DIAGNOSIS — Z7901 Long term (current) use of anticoagulants: Secondary | ICD-10-CM | POA: Diagnosis not present

## 2019-01-17 DIAGNOSIS — R0609 Other forms of dyspnea: Secondary | ICD-10-CM | POA: Diagnosis not present

## 2019-01-17 DIAGNOSIS — I480 Paroxysmal atrial fibrillation: Secondary | ICD-10-CM

## 2019-01-17 DIAGNOSIS — I130 Hypertensive heart and chronic kidney disease with heart failure and stage 1 through stage 4 chronic kidney disease, or unspecified chronic kidney disease: Secondary | ICD-10-CM | POA: Diagnosis not present

## 2019-01-17 DIAGNOSIS — N183 Chronic kidney disease, stage 3 (moderate): Secondary | ICD-10-CM

## 2019-01-17 DIAGNOSIS — I4819 Other persistent atrial fibrillation: Secondary | ICD-10-CM

## 2019-01-17 DIAGNOSIS — I5043 Acute on chronic combined systolic (congestive) and diastolic (congestive) heart failure: Secondary | ICD-10-CM | POA: Diagnosis not present

## 2019-01-17 DIAGNOSIS — E66813 Obesity, class 3: Secondary | ICD-10-CM | POA: Diagnosis present

## 2019-01-17 DIAGNOSIS — I10 Essential (primary) hypertension: Secondary | ICD-10-CM | POA: Diagnosis not present

## 2019-01-17 LAB — BASIC METABOLIC PANEL
Anion gap: 9 (ref 5–15)
BUN: 37 mg/dL — ABNORMAL HIGH (ref 8–23)
CO2: 25 mmol/L (ref 22–32)
Calcium: 8.9 mg/dL (ref 8.9–10.3)
Chloride: 106 mmol/L (ref 98–111)
Creatinine, Ser: 1.27 mg/dL — ABNORMAL HIGH (ref 0.44–1.00)
GFR calc Af Amer: 48 mL/min — ABNORMAL LOW (ref >60)
GFR calc non Af Amer: 41 mL/min — ABNORMAL LOW (ref >60)
Glucose, Bld: 96 mg/dL (ref 70–99)
Potassium: 4.8 mmol/L (ref 3.5–5.1)
Sodium: 140 mmol/L (ref 135–145)

## 2019-01-17 MED ORDER — SODIUM CHLORIDE 0.9% FLUSH
3.0000 mL | Freq: Two times a day (BID) | INTRAVENOUS | Status: DC
  Administered 2019-01-17: 3 mL via INTRAVENOUS

## 2019-01-17 MED ORDER — SODIUM CHLORIDE 0.9 % IV SOLN
INTRAVENOUS | Status: DC
  Administered 2019-01-18: 05:00:00 via INTRAVENOUS

## 2019-01-17 MED ORDER — SODIUM CHLORIDE 0.9% FLUSH: 3.0000 mL | INTRAVENOUS | Status: DC | PRN

## 2019-01-17 MED ORDER — SODIUM CHLORIDE 0.9 % IV SOLN: 250.0000 mL | INTRAVENOUS | Status: DC | PRN

## 2019-01-17 MED ORDER — FUROSEMIDE 10 MG/ML IJ SOLN
40.0000 mg | Freq: Once | INTRAMUSCULAR | Status: AC
  Administered 2019-01-17: 40 mg via INTRAVENOUS
  Filled 2019-01-17: qty 4

## 2019-01-17 MED ORDER — ATORVASTATIN CALCIUM 10 MG PO TABS
20.0000 mg | ORAL_TABLET | Freq: Every day | ORAL | Status: DC
  Administered 2019-01-17 – 2019-01-19 (×3): 20 mg via ORAL
  Filled 2019-01-17 (×3): qty 2
  Filled 2019-01-17: qty 1

## 2019-01-17 MED ORDER — ASPIRIN 81 MG PO CHEW
81.0000 mg | CHEWABLE_TABLET | ORAL | Status: AC
  Administered 2019-01-18: 81 mg via ORAL

## 2019-01-17 MED ORDER — ASPIRIN 81 MG PO CHEW
81.0000 mg | CHEWABLE_TABLET | Freq: Every day | ORAL | Status: DC
  Filled 2019-01-17: qty 1

## 2019-01-17 NOTE — Progress Notes (Signed)
Patient Demographics:    Leslie Alexander, is a 78 y.o. female, DOB - [birth year], NSQ:[id number]  Admit date - 01/16/2019   Admitting Physician Onnie Boer, MD  Outpatient Primary MD for the patient is Richardean Chimera, MD  LOS - 0   Chief Complaint  Patient presents with  . Shortness of Breath        Subjective:    Leslie Alexander today has no fevers, no emesis, daughter at bedside, patient endorses dyspnea at rest and also dyspnea on exertion, denies frank chest pains but does get occasional chest tightness---  Assessment  & Plan :    Principal Problem:   Dyspnea/possible Angina equivalent Active Problems:   PAF (paroxysmal atrial fibrillation) (HCC)   Essential hypertension   Obesity, Class III, BMI 40-49.9 (morbid obesity) (HCC)   Acute on chronic combined systolic and diastolic CHF (congestive heart failure) (HCC)   Anticoagulant long-term use/Eliquis for Afib  Brief Summary 79 y.o. female with past medical history of persistent atrial fibrillation (on Eliquis for anticoagulation), CHF, chronic LBBB, mild nonobstructive CAD by cath in 2016, HTN, OSA (not compliant with CPAP), Pulmonary HTN, and Stage 3 CKD who was  admitted on 01/16/2019 with dyspnea and concerns about anginal equivalent, being [facility] to [facility] for left and right heart cath scheduled for 01/18/2019, last dose of Eliquis was p.m. dose on 01/16/2019   Plan:- 1)Dyspnea/possible Angina equivalent--- patient has ruled out for ACS already by EKG and troponins , cardiology service recommends LHC , continue metoprolol XL 50 mg daily, give aspirin 81 mg daily, and Lipitor 20 mg qpm  2)HFrEF--- patient with history of chronic combined systolic and diastolic CHF---- Recent ECHO shows decline in EF- 01/12/19- now 40 to 45%, from 11/2014-50 to 55%----also the repeat echo showed moderate MR and moderate to severe TR,  BNP elevated  to 851, however clinically and radiologically no evidence of significant volume overload at this time , she has been compliant with torsemide PTA , also used metolazone for couple of days PTA , patient reports about a 5 pound weight gain recently, review of records reveals about 7 pound weight gain since October 2019-----responded well to IV Lasix, as per cardiology service okay to hold further Lasix after today's dose, apparently intolerant to ace inhibitors, consider ARB post cardiac cath, patient will need RHC on 01/18/19 due to history of pulmonary pretension with valvular heart disease and worsening dyspnea  3)Persistent AFib----continue metoprolol Xl 50 mg daily for rate control, last dose of Eliquis was 01/16/2019 p.m., Eliquis currently on hold to allow for LHC and RHC scheduled for 01/18/2019  4)Morbid Obesity with OSA--- has not been compliant with CPAP machine in over 5 years, will need repeat sleep study as outpatient--lifestyle and dietary modifications strongly advised and encouraged  5)CKD III--- baseline creatinine 1.3-1.4, currently down to 1.27, watch electrolytes and renal function closely with IV diuresis and upcoming LHC and RHC  6)NeuroPsych--- mood is stable, continue primidone 50 mg twice daily, continue trazodone 50 mg nightly  7) hypothyroidism--- TSH is 1.7, continue levothyroxine 100 mcg daily  Disposition/Need for in-Hospital Stay- patient unable to be discharged at this time due to need for [facility] and RHC for further evaluation of possible ischemia and pulmonary  hypertension  Code Status : Full  Family Communication:   Daughter at bedside   Disposition Plan  : TBD  Consults  :  Cardiology  DVT Prophylaxis  :   SCDs (Eliquis is on hold to allow for LHC and RHC  Lab Results  Component Value Date   PLT 152 01/16/2019    Inpatient Medications  Scheduled Meds: . allopurinol  300 mg Oral Daily  . busPIRone  15 mg Oral BID  . furosemide  40 mg Intravenous Once  .  gabapentin  300 mg Oral TID  . levothyroxine  100 mcg Oral Q0600  . metoprolol succinate  50 mg Oral Daily  . pantoprazole  40 mg Oral Daily  . potassium chloride SA  40 mEq Oral BID  . primidone  50 mg Oral BID  . traZODone  50 mg Oral QHS   Continuous Infusions: PRN Meds:.acetaminophen **OR** acetaminophen, ondansetron **OR** ondansetron (ZOFRAN) IV, polyethylene glycol    Anti-infectives (From admission, onward)   None        Objective:   Vitals:   01/16/19 1930 01/16/19 2009 01/16/19 2046 01/17/19 0603  BP: 129/62 136/76  (!) 110/51  Pulse: 61 70  61  Resp: 20 20  16   Temp:  97.7 F (36.5 C)  97.8 F (36.6 C)  TempSrc:  Oral  Oral  SpO2: 92% 92% 94% 92%  Weight:    121.3 kg    Wt Readings from Last 3 Encounters:  01/17/19 121.3 kg  01/05/19 122.1 kg  09/13/18 119.1 kg     Intake/Output Summary (Last 24 hours) at 01/17/2019 1042 Last data filed at 01/17/2019 0737 Gross per 24 hour  Intake 240 ml  Output 500 ml  Net -260 ml     Physical Exam Patient is examined daily including today on 01/17/19 , exams remain the same as of yesterday except that has changed   Gen:- Awake Alert, obese, in no acute distress, able to speak in complete sentences at rest HEENT:- [place].AT, No sclera icterus Neck-Supple Neck,No JVD,.  Lungs-  CTAB , fair symmetrical air movement CV- S1, S2 normal, irregularly irregular, 2/6 systolic murmur Abd-  +ve B.Sounds, Abd Soft, No tenderness, increased truncal adiposity Extremity/Skin:- No significant edema, negative Homans,  pedal pulses present  Psych-affect is appropriate, oriented x3 Neuro-no new focal deficits, no tremors   Data Review:   Micro Results No results found for this or any previous visit (from the past 240 hour(s)).  Radiology Reports Dg Chest 2 View  Result Date: 01/16/2019 CLINICAL DATA:  Chest tightness and shortness of breath today. History of CHF. EXAM: CHEST - 2 VIEW COMPARISON:  Chest x-rays dated 01/05/2019  and 08/17/2018. FINDINGS: Stable cardiomegaly. Coarse interstitial markings bilaterally, not significantly changed compared to multiple prior studies. No evidence of alveolar pulmonary edema. No confluent opacity to suggest a developing pneumonia. No pleural effusion or pneumothorax seen. Osseous structures about the chest are unremarkable. IMPRESSION: 1. No acute findings. No evidence of pneumonia or alveolar pulmonary edema. 2. Stable cardiomegaly. 3. Coarse interstitial markings, stable compared to previous exams, most likely chronic mild interstitial edema. Electronically Signed   By: Bary Richard M.D.   On: 01/16/2019 14:22   Dg Chest 2 View  Result Date: 01/05/2019 CLINICAL DATA:  Patient with dry cough.  Chest pain. EXAM: CHEST - 2 VIEW COMPARISON:  Chest radiograph 11/02/2018 FINDINGS: Cardiomegaly. Bilateral interstitial pulmonary opacities. No pleural effusion or pneumothorax. Thoracic spine degenerative changes. IMPRESSION: Cardiomegaly.  Mild interstitial opacities  may represent mild edema. Electronically Signed   By: Annia Belt M.D.   On: 01/05/2019 16:58     CBC Recent Labs  Lab 01/16/19 1328  WBC 5.9  HGB 11.5*  HCT 39.2  PLT 152  MCV 97.3  MCH 28.5  MCHC 29.3*  RDW 16.2*  LYMPHSABS 1.3  MONOABS 0.6  EOSABS 0.1  BASOSABS 0.0    Chemistries  Recent Labs  Lab 01/16/19 1328 01/17/19 0414  NA 136 140  K 4.4 4.8  CL 105 106  CO2 22 25  GLUCOSE 110* 96  BUN 40* 37*  CREATININE 1.34* 1.27*  CALCIUM 8.5* 8.9  MG 2.2  --   AST 42*  --   ALT 52*  --   ALKPHOS 172*  --   BILITOT 0.8  --    ------------------------------------------------------------------------------------------------------------------ No results for input(s): CHOL, HDL, LDLCALC, TRIG, CHOLHDL, LDLDIRECT in the last 72 hours.  Lab Results  Component Value Date   HGBA1C 6.5 (H) 12/19/2014    ------------------------------------------------------------------------------------------------------------------ Recent Labs    01/16/19 1328  TSH 1.768   ------------------------------------------------------------------------------------------------------------------ No results for input(s): VITAMINB12, FOLATE, FERRITIN, TIBC, IRON, RETICCTPCT in the last 72 hours.  Coagulation profile No results for input(s): INR, PROTIME in the last 168 hours.  No results for input(s): DDIMER in the last 72 hours.  Cardiac Enzymes Recent Labs  Lab 01/16/19 1328 01/16/19 1707 01/16/19 2258  TROPONINI <0.03 <0.03 <0.03   ------------------------------------------------------------------------------------------------------------------    Component Value Date/Time   BNP 700.0 (H) 01/16/2019 1328     Berk Pilot M.D on 01/17/2019 at 10:42 AM  Go to [email or web address] - for contact info  [facility] - Office  [phone]

## 2019-01-17 NOTE — Care Management Obs Status (Signed)
MEDICARE OBSERVATION STATUS NOTIFICATION   Patient Details  Name: Leslie Alexander MRN: [id number] Date of Birth: 1941/03/14   Medicare Observation Status Notification Given:  Yes    Bibi Economos, Chrystine Oiler, RN 01/17/2019, 7:41 AM

## 2019-01-17 NOTE — Consult Note (Addendum)
Cardiology Consult    Patient ID: SHERRYL AMBROSI; [id number]; [birth year]   Admit date: 01/16/2019 Date of Consult: 01/17/2019  Primary Care Provider: Richardean Chimera, MD Primary Cardiologist: Prentice Docker, MD   Patient Profile    Leslie Alexander is a 78 y.o. female with past medical history of persistent atrial fibrillation (on Eliquis for anticoagulation), chronic diastolic CHF, chronic LBBB, mild nonobstructive CAD by cath in 2016, HTN, OSA (not compliant with CPAP), Pulmonary HTN, and Stage 3 CKD who is being seen today for the evaluation of dyspnea on exertion and CHF at the request of Dr. Wendie Chess.   History of Present Illness    Ms. Otten was recently examined by Dr. Purvis Sheffield on 01/05/2019 and reported having recently been hospitalized at [facility] in 10/2018 for PNA and CHF exacerbation. At the time of her visit, she reported having dyspnea with minimal activity such as walking to the restroom. Denied any associated chest pain or palpitations at that time. BNP was obtained and elevated at 851 and she was taking Lasix 80mg  at 0800, 80mg  at 1400, and 40mg  at 2000. No changes were made to her regimen at that time until further labs were obtained with consideration of switching to Torsemide if symptoms did not improve.  She called the office on 01/06/2019 reporting worsening symptoms and Lasix was discontinued and she was started on Torsemide 40 mg BID for the next 3 days then 20 mg twice daily. Was also instructed to use Metolazone 2.5mg  daily for the next 2 days. An echocardiogram was obtained for further evaluation and showed her EF was reduced to 40-45% with mildly reduced RV function. Was also noted to have moderate MR and moderate to severe TR.  Her daughter called again on 01/16/2019 to report she was experiencing worsening dyspnea. ED evaluation was therefore recommended. Initial labs showed WBC 5.9, Hgb 11.5, platelets 152, Na+ 136, K+ 4.4, and creatinine 1.34 (close to  baseline). BNP 700. Initial and cyclic troponin values have been negative. CXR showed no acute findings with stable cardiomegaly. EKG showed atrial fibrillation, HR 66, with known LBBB. IV Lasix 40 mg was administered upon admission and she reports several urine occurrences with this. Accurate I's and O's not recorded.  In talking with the patient and her daughter today, she reports having progressive dyspnea on exertion over the past month and has now been experiencing chest tightness and dyspnea on exertion for the past 2 weeks. Notes this with minimal activity. Does report an occasional shooting pain along her left pectoral region which only lasts for a few seconds and spontaneously resolves. Says that her weight did decrease by 5 pounds to 262 lbs on her home scales with the transition to Torsemide but she did not experience any improvement in her dyspnea with this. Reports baseline orthopnea with no recent changes. No recent PND or lower extremity edema. Her daughter is concerned as she consumes a significant amount of fluids during the day due to having a chronic dry mouth. Also reports she has not utilized her CPAP in over 5 years.  Reports good compliance with her current medication regimen including Eliquis. Denies any evidence of active bleeding.   Past Medical History:  Diagnosis Date  . Atrial fibrillation (HCC)   . Depression   . Diastolic heart failure    LVEF 55-60%  . Essential hypertension, benign   . GERD (gastroesophageal reflux disease)   . History of breast cancer   . Hx of atrial fibrillation,  no current medication    converted out of A. Fib with cardioversion  . Hypothyroidism   . Left bundle branch block    negative Lexiscan Myoview; EF 56%, 3/13   . Lumbar disc disease   . Meralgia paresthetica, right   . Mixed hyperlipidemia   . Morbid obesity (HCC)   . Nonalcoholic fatty liver disease   . OSA (obstructive sleep apnea)    CPAP machine  . Pulmonary hypertension  (HCC)    RVSP 55-60 mm mercury    Past Surgical History:  Procedure Laterality Date  . CATARACT EXTRACTION W/PHACO Left 12/18/2013   Procedure: CATARACT EXTRACTION PHACO AND INTRAOCULAR LENS PLACEMENT (IOC);  Surgeon: Gemma Payor, MD;  Location: [facility];  Service: Ophthalmology;  Laterality: Left;  CDE 12.70  . CATARACT EXTRACTION, BILATERAL  04/19/2008   Dr. Alto Denver  . FEMORAL EXPLORATION  12/2003   RIGHT LATERAL FEMORAL CUTANEOUS NERVE/Dr. Channing Mutters  . HEEL SPUR SURGERY  07/2006   For spurs/Dr. Ulice Brilliant  . INCISION AND DRAINAGE OF WOUND  01/26/2012   Dr. Chaney Malling  . KNEE ARTHROSCOPY  10/2005   Left knee/from torn cartilage Dr. Edger House  . LAPAROSCOPIC CHOLECYSTECTOMY  03/02/2011   Dr. Gabriel Cirri  . LEFT HEART CATHETERIZATION WITH CORONARY ANGIOGRAM N/A 12/20/2014   Procedure: LEFT HEART CATHETERIZATION WITH CORONARY ANGIOGRAM;  Surgeon: Peter M Swaziland, MD;  Location: [facility] CATH LAB;  Service: Cardiovascular;  Laterality: N/A;  . MASTECTOMY, RADICAL Left 1989   With chemotherapy  . PARS PLANA VITRECTOMY W/ REPAIR OF MACULAR HOLE  05/2008   Dr. Luciana Axe  . TOTAL KNEE ARTHROPLASTY Left 12/29/2011   Dr. Chaney Malling  . TUBAL LIGATION     BILATERAL     Home Medications:  Prior to Admission medications   Medication Sig Start Date End Date Taking? Authorizing Provider  acetaminophen (TYLENOL) 325 MG tablet Take 650 mg by mouth 3 (three) times daily.   Yes [provider]  allopurinol (ZYLOPRIM) 300 MG tablet Take 300 mg daily by mouth. 09/16/17  Yes [provider]  apixaban (ELIQUIS) 5 MG TABS tablet Take 1 tablet (5 mg total) by mouth 2 (two) times daily. 01/08/16  Yes Laqueta Linden, MD  busPIRone (BUSPAR) 15 MG tablet Take 15 mg by mouth 2 (two) times daily. 08/29/18  Yes [provider]  DULoxetine (CYMBALTA) 60 MG capsule  07/13/18  Yes [provider]  gabapentin (NEURONTIN) 300 MG capsule Take 300 mg by mouth 3 (three) times daily.   Yes [provider]  levothyroxine (SYNTHROID, LEVOTHROID) 100 MCG tablet Take 100 mcg by mouth daily.   Yes [provider]  metoprolol succinate (TOPROL-XL) 50 MG 24 hr tablet TAKE 1 TABLET 2 TIMES A DAY, TAKE WITH OR IMMEDIATELY FOLLOWING A MEAL. 09/09/15  Yes Laqueta Linden, MD  Multiple Vitamins-Minerals (MULTIVITAMINS THER. W/MINERALS) TABS tablet Take 1 tablet by mouth daily.   Yes [provider]  omeprazole (PRILOSEC) 20 MG capsule Take 20 mg by mouth daily.   Yes [provider]  polyethylene glycol powder (GLYCOLAX/MIRALAX) powder Take 17 g daily as needed by mouth. 09/16/17  Yes [provider]  potassium chloride SA (K-DUR,KLOR-CON) 20 MEQ tablet Take 40 mEq by mouth 2 (two) times daily.   Yes [provider]  primidone (MYSOLINE) 50 MG tablet Take 50 mg by mouth 3 (three) times daily.    Yes [provider]  torsemide (DEMADEX) 20 MG tablet TAKE 2 TABLETS 2 TIMES DAILY FOR 3 DAYS THEN  TAKE 20 MG 2 TIMES DAILY 01/06/19  Yes Laqueta Linden, MD  traZODone (DESYREL) 50 MG tablet Take 50 mg by mouth at bedtime.  07/13/18  Yes [provider]  metolazone (ZAROXOLYN) 2.5 MG tablet TAKE 1 TABLET DAILY FOR 2 DAYS Patient not taking: Reported on 01/16/2019 01/06/19   Laqueta Linden, MD    Inpatient Medications: Scheduled Meds: . allopurinol  300 mg Oral Daily  . apixaban  5 mg Oral BID  . busPIRone  15 mg Oral BID  . gabapentin  300 mg Oral TID  . levothyroxine  100 mcg Oral Q0600  . metoprolol succinate  50 mg Oral Daily  . pantoprazole  40 mg Oral Daily  . potassium chloride SA  40 mEq Oral BID  . primidone  50 mg Oral BID  . traZODone  50 mg Oral QHS   Continuous Infusions:  PRN Meds: acetaminophen **OR** acetaminophen, ondansetron **OR** ondansetron (ZOFRAN) IV, polyethylene glycol  Allergies:    Allergies  Allergen Reactions  . Codeine Palpitations  . Latex   . Ace Inhibitors Cough    Social History:     Social History   Socioeconomic History  . Marital status: Widowed    Spouse name: Not on file  . Number of children: 1  . Years of education: 21  . Highest education level: High school graduate  Occupational History  . Occupation: retired Engineer, petroleum  Social Needs  . Financial resource strain: Not on file  . Food insecurity:    Worry: Not on file    Inability: Not on file  . Transportation needs:    Medical: Not on file    Non-medical: Not on file  Tobacco Use  . Smoking status: Former Smoker    Packs/day: 0.50    Years: 10.00    Pack years: 5.00    Types: Cigarettes    Start date: 06/03/1965    Last attempt to quit: 11/24/1987    Years since quitting: 31.1  . Smokeless tobacco: Never Used  Substance and Sexual Activity  . Alcohol use: No    Alcohol/week: 0.0 standard drinks  . Drug use: No  . Sexual activity: Never  Lifestyle  . Physical activity:    Days per week: Not on file    Minutes per session: Not on file  . Stress: Not on file  Relationships  . Social connections:    Talks on phone: Not on file    Gets together: Not on file    Attends religious service: Not on file    Active member of club or organization: Not on file    Attends meetings of clubs or organizations: Not on file    Relationship status: Not on file  . Intimate partner violence:    Fear of current or ex partner: Not on file    Emotionally abused: Not on file    Physically abused: Not on file    Forced sexual activity: Not on file  Other Topics Concern  . Not on file  Social History Narrative   Has 1 daughter that lives with her in a one story home.  Retired Armed forces training and education officer.  Education: high school.      Family History:    Family History  Problem Relation Age of Onset  . Other Father        Ulcer disease  . COPD Mother   . Breast cancer Sister       Review of Systems  General:  No chills, fever, night sweats or weight changes.  Cardiovascular:  No edema,  orthopnea, palpitations, paroxysmal nocturnal dyspnea. Positive for chest pain and dyspnea on exertion.  Dermatological: No rash, lesions/masses Respiratory: No cough, dyspnea Urologic: No hematuria, dysuria Abdominal:   No nausea, vomiting, diarrhea, bright red blood per rectum, melena, or hematemesis Neurologic:  No visual changes, wkns, changes in mental status. All other systems reviewed and are otherwise negative except as noted above.  Physical Exam/Data    Vitals:   01/16/19 1930 01/16/19 2009 01/16/19 2046 01/17/19 0603  BP: 129/62 136/76  (!) 110/51  Pulse: 61 70  61  Resp: Temp:  97.7 F (36.5 C)  97.8 F (36.6 C)  TempSrc:  Oral  Oral  SpO2: 92% 92% 94% 92%  Weight:    121.3 kg    Intake/Output Summary (Last 24 hours) at 01/17/2019 1610 Last data filed at 01/16/2019 2300 Gross per 24 hour  Intake 240 ml  Output 500 ml  Net -260 ml   Filed Weights   01/17/19 0603  Weight: 121.3 kg   Body mass index is 44.5 kg/m.   General: Pleasant, obese Caucasian female appearing in NAD Psych: Normal affect. Neuro: Alert and oriented X 3. Moves all extremities spontaneously. HEENT: Normal  Neck: Supple without bruits. JVD at 8 cm. Lungs:  Resp regular and unlabored, decreased along bases but no definitive wheezing or rhonchi. Heart: Irregularly irregular. No s3, s4, 2/6 holosystolic murmur along RUSB.  Abdomen: Soft, non-tender, non-distended, BS + x 4.  Extremities: No clubbing. Trace ankle edema bilaterally. DP/PT/Radials 2+ and equal bilaterally.   EKG:  The EKG was personally reviewed and demonstrates: Atrial fibrillation, HR 66, with known LBBB.  Telemetry:  Telemetry was personally reviewed and demonstrates: Atrial fibrillation, HR in 70's to 80's.    Labs/Studies     Relevant CV Studies:  Cardiac Catheterization: 11/2014 Coronary angiography: Coronary dominance: right  Left mainstem: Normal  Left anterior descending (LAD): minor wall  irregularities less than 10%  Left circumflex (LCx): Mild disease in the mid vessel up to 20%.   Right coronary artery (RCA): Diffuse disease in the mid vessel up to 30%.   Left ventriculography: Left ventricular systolic function is normal, LVEF is estimated at 55-65%, there is no significant mitral regurgitation   Final Conclusions:   1. Mild nonobstructive CAD 2. Normal LV function.  Recommendations: Based on these results I think her chest pain was related more to her atrial fibrillation. Will DC IV Ntg and ambulate today. May be stable for DC later today.  Echocardiogram: 01/12/2019 IMPRESSIONS  1. The cavity size was mildly dilated. There is mildly increased left ventricular wall thickness. Left ventricular ejection fraction of 40-45% with septal motion suggestive of LBBB. Left ventricular diastolic Doppler parameters are indeterminate. There  is right ventricular volume overload.  2. The right ventricle has mildly reduced systolic function. The cavity was moderately enlarged. There is no increase in right ventricular wall thickness. Right ventricular systolic pressure is moderately elevated with an estimated pressure of 54.4  mmHg.  3. The aortic valve is tricuspid Aortic valve regurgitation is trivial by color flow Doppler.. Mild aortic annular calcification noted.  4. The mitral valve is normal in structure. There is mild mitral annular calcification present. Mitral valve regurgitation is moderate by color flow Doppler.  5. The tricuspid valve is normal in structure. Tricuspid valve regurgitation is moderate-severe.  6. The aortic root is normal in size  and structure.  7. Right atrial size was moderately dilated.  8. Left atrial size was mildly dilated.  9. The inferior vena cava was dilated in size with <50% respiratory variability.  Laboratory Data:  Chemistry Recent Labs  Lab 01/16/19 1328 01/17/19 0414  NA 136 140  K 4.4 4.8  CL 105 106  CO2 22 25  GLUCOSE  110* 96  BUN 40* 37*  CREATININE 1.34* 1.27*  CALCIUM 8.5* 8.9  GFRNONAA 39* 41*  GFRAA 45* 48*  ANIONGAP 9 9    Recent Labs  Lab 01/16/19 1328  PROT 6.3*  ALBUMIN 3.5  AST 42*  ALT 52*  ALKPHOS 172*  BILITOT 0.8   Hematology Recent Labs  Lab 01/16/19 1328  WBC 5.9  RBC 4.03  HGB 11.5*  HCT 39.2  MCV 97.3  MCH 28.5  MCHC 29.3*  RDW 16.2*  PLT 152   Cardiac Enzymes Recent Labs  Lab 01/16/19 1328 01/16/19 1707 01/16/19 2258  TROPONINI <0.03 <0.03 <0.03   No results for input(s): TROPIPOC in the last 168 hours.  BNP Recent Labs  Lab 01/16/19 1328  BNP 700.0*    DDimer No results for input(s): DDIMER in the last 168 hours.  Radiology/Studies:  Dg Chest 2 View  Result Date: 01/16/2019 CLINICAL DATA:  Chest tightness and shortness of breath today. History of CHF. EXAM: CHEST - 2 VIEW COMPARISON:  Chest x-rays dated 01/05/2019 and 08/17/2018. FINDINGS: Stable cardiomegaly. Coarse interstitial markings bilaterally, not significantly changed compared to multiple prior studies. No evidence of alveolar pulmonary edema. No confluent opacity to suggest a developing pneumonia. No pleural effusion or pneumothorax seen. Osseous structures about the chest are unremarkable. IMPRESSION: 1. No acute findings. No evidence of pneumonia or alveolar pulmonary edema. 2. Stable cardiomegaly. 3. Coarse interstitial markings, stable compared to previous exams, most likely chronic mild interstitial edema. Electronically Signed   By: Bary Richard M.D.   On: 01/16/2019 14:22     Assessment & Plan    1. Dyspnea on Exertion/ New Cardiomyopathy - She reports a 2+ week history of progressive dyspnea on exertion with associated chest tightness.  Recent echocardiogram showed her EF was reduced at 40 to 45% (previously 50 to 55% in 2016).  She did experience a weight decrease with the transition from Lasix to Torsemide but is still experiencing dyspnea on exertion with minimal activity such as  walking around her home. Symptoms resolve with rest. - Initial and cyclic troponin values have been negative and EKG shows known left bundle branch block. - Given her progressive symptoms and reduced EF, would anticipate a R/LHC for definitive evaluation. She did receive Eliquis yesterday evening, therefore her procedure would be delayed until tomorrow afternoon at the earliest. - not on ASA as an outpatient given the need for anticoagulation. Continue beta-blocker therapy. Will recheck FLP as she is not currently on statin therapy (did have mild CAD by cath in 2016 as outlined above).  2. Chronic Combined Systolic and Diastolic CHF - recent echo showed her EF was reduced to 40-45% with mildly reduced RV function. Was also noted to have moderate MR and moderate to severe TR. - Despite having good diuresis with Torsemide, she has still been experiencing dyspnea on exertion.  BNP was elevated to 700 on admission but CXR was actually improved when compared to prior imaging. - Received IV Lasix 40 mg at the time of admission and labs this morning show her creatinine has improved from 1.34 to 1.27. Will write for  additional Lasix  this morning. Hold further Lasix tomorrow pending cath.  - continue Toprol-XL  daily. Had coughing with ACE-I in the past. Consider addition of ARB following cath. Will need to follow renal function closely given Stage 3 CKD.   3. Persistent Atrial Fibrillation - rates have been well controlled in the 70's to 80's. Continue Toprol-XL 50 mg daily for rate control. - will hold Eliquis for upcoming cath. Will discuss Heparin bridging with Dr. Diona Browner. No history of PE/DVT/CVA by review of history.   4. Stage 3 CKD - baseline creatinine 1.3 - 1.4. At 1.27 today. Repeat BMET in AM.   5. OSA - she has not utilized her CPAP in 5+ years. Will need a repeat sleep study as an outpatient.    For questions or updates, please contact [facility] Please consult  [email or web address] for contact info under Cardiology/STEMI.  Signed, Ellsworth Lennox, PA-C 01/17/2019, 8:33 AM Pager: [phone]   Attending note:  Patient seen and examined.  I reviewed extensive records and discussed the case with Ms. Iran Ouch PA-C.  Ms. Gamboa presents with a history of chronic diastolic heart failure, left bundle branch block, nonobstructive CAD by previous cardiac catheterization in 2016, OSA with noncompliance on CPAP, CKD stage III, pulmonary hypertension, and persistent atrial fibrillation.  She was evaluated by Dr. Purvis Sheffield in mid February reporting dyspnea on exertion and leading to an advance in her diuretic regimen.  Despite losing nearly 6 pounds she still did not have significant improvement in symptoms.  Follow-up echocardiogram demonstrated a reduction in LVEF to the range of 40 to 45% with moderate mitral regurgitation.  Patient also describes progressive chest tightness with exertion and came to the ER at the recommendation of Dr. Purvis Sheffield.  On examination today she is in no distress, no active chest pain.  Heart rate is in the 60s to 70s in atrial fibrillation by telemetry which I personally reviewed.  Blood pressure adequately controlled.  Lungs exhibit decreased breath sounds without wheezing.  Cardiac exam is irregularly irregular rhythm.  She does have chronic appearing leg edema.  Lab work shows potassium 4.8, BUN 37, creatinine 1.27, negative troponin I levels, BNP 700, hemoglobin 11.5, platelets 152.  ECG shows rate controlled atrial fibrillation with left bundle branch block.  Chest x-ray reports cardiomegaly with mild interstitial edema.  Patient presents with progressive dyspnea on exertion and chest tightness raising concern of possible unstable angina in the setting of decompensated combined heart failure.  She also has pulmonary hypertension which may be contributing in the setting of untreated OSA.  She had only mild coronary atherosclerosis by  cardiac catheterization in 2016 however.  She has not improved with diuresis.  Situation was discussed with the patient and plan is to have her transferred to [facility] in anticipation of a diagnostic right and left heart catheterization to help guide further treatment options.  Heart rate is well controlled on Toprol-XL, Eliquis will be held today in anticipation of the procedure tomorrow.  Jonelle Sidle, M.D., F.A.C.C.

## 2019-01-17 NOTE — Plan of Care (Signed)
  Problem: Education: Goal: Knowledge of General Education information will improve Description Including pain rating scale, medication(s)/side effects and non-pharmacologic comfort measures Outcome: Progressing   Problem: Clinical Measurements: Goal: Ability to maintain clinical measurements within normal limits will improve Outcome: Progressing   Problem: Activity: Goal: Risk for activity intolerance will decrease Outcome: Progressing   Problem: Nutrition: Goal: Adequate nutrition will be maintained Outcome: Progressing   Problem: Cardiac: Goal: Ability to achieve and maintain adequate cardiopulmonary perfusion will improve Outcome: Progressing

## 2019-01-17 NOTE — Progress Notes (Signed)
Patient being transported to [facility] for further cardiac evaluation. Report called and given to [facility] RN  Department 3 east bed 1C. [facility] to transport patient to awaiting facility. Will continue to monitor patient.

## 2019-01-18 ENCOUNTER — Encounter (HOSPITAL_COMMUNITY): Payer: Self-pay | Admitting: Cardiology

## 2019-01-18 ENCOUNTER — Encounter (HOSPITAL_COMMUNITY)
Admission: EM | Disposition: A | Discharge status: Home / Self Care | Payer: Self-pay | Source: Home / Self Care | Attending: Internal Medicine

## 2019-01-18 DIAGNOSIS — E782 Mixed hyperlipidemia: Secondary | ICD-10-CM | POA: Diagnosis present

## 2019-01-18 DIAGNOSIS — I2721 Secondary pulmonary arterial hypertension: Secondary | ICD-10-CM

## 2019-01-18 DIAGNOSIS — I2510 Atherosclerotic heart disease of native coronary artery without angina pectoris: Secondary | ICD-10-CM

## 2019-01-18 DIAGNOSIS — I5043 Acute on chronic combined systolic (congestive) and diastolic (congestive) heart failure: Secondary | ICD-10-CM | POA: Diagnosis present

## 2019-01-18 DIAGNOSIS — I5021 Acute systolic (congestive) heart failure: Secondary | ICD-10-CM | POA: Diagnosis not present

## 2019-01-18 DIAGNOSIS — G4733 Obstructive sleep apnea (adult) (pediatric): Secondary | ICD-10-CM

## 2019-01-18 DIAGNOSIS — Z9012 Acquired absence of left breast and nipple: Secondary | ICD-10-CM | POA: Diagnosis not present

## 2019-01-18 DIAGNOSIS — N183 Chronic kidney disease, stage 3 (moderate): Secondary | ICD-10-CM | POA: Diagnosis present

## 2019-01-18 DIAGNOSIS — Z9049 Acquired absence of other specified parts of digestive tract: Secondary | ICD-10-CM | POA: Diagnosis not present

## 2019-01-18 DIAGNOSIS — E875 Hyperkalemia: Secondary | ICD-10-CM | POA: Diagnosis present

## 2019-01-18 DIAGNOSIS — I10 Essential (primary) hypertension: Secondary | ICD-10-CM

## 2019-01-18 DIAGNOSIS — F329 Major depressive disorder, single episode, unspecified: Secondary | ICD-10-CM | POA: Diagnosis present

## 2019-01-18 DIAGNOSIS — I509 Heart failure, unspecified: Secondary | ICD-10-CM

## 2019-01-18 DIAGNOSIS — I083 Combined rheumatic disorders of mitral, aortic and tricuspid valves: Secondary | ICD-10-CM | POA: Diagnosis present

## 2019-01-18 DIAGNOSIS — I5023 Acute on chronic systolic (congestive) heart failure: Secondary | ICD-10-CM | POA: Diagnosis not present

## 2019-01-18 DIAGNOSIS — I50811 Acute right heart failure: Secondary | ICD-10-CM

## 2019-01-18 DIAGNOSIS — I130 Hypertensive heart and chronic kidney disease with heart failure and stage 1 through stage 4 chronic kidney disease, or unspecified chronic kidney disease: Secondary | ICD-10-CM | POA: Diagnosis present

## 2019-01-18 DIAGNOSIS — Z7901 Long term (current) use of anticoagulants: Secondary | ICD-10-CM | POA: Diagnosis not present

## 2019-01-18 DIAGNOSIS — E039 Hypothyroidism, unspecified: Secondary | ICD-10-CM | POA: Diagnosis present

## 2019-01-18 DIAGNOSIS — I2729 Other secondary pulmonary hypertension: Secondary | ICD-10-CM | POA: Diagnosis present

## 2019-01-18 DIAGNOSIS — R0609 Other forms of dyspnea: Secondary | ICD-10-CM | POA: Diagnosis present

## 2019-01-18 DIAGNOSIS — Z96652 Presence of left artificial knee joint: Secondary | ICD-10-CM | POA: Diagnosis present

## 2019-01-18 DIAGNOSIS — K760 Fatty (change of) liver, not elsewhere classified: Secondary | ICD-10-CM | POA: Diagnosis present

## 2019-01-18 DIAGNOSIS — I447 Left bundle-branch block, unspecified: Secondary | ICD-10-CM | POA: Diagnosis present

## 2019-01-18 DIAGNOSIS — Z6841 Body Mass Index (BMI) 40.0 and over, adult: Secondary | ICD-10-CM | POA: Diagnosis not present

## 2019-01-18 DIAGNOSIS — K219 Gastro-esophageal reflux disease without esophagitis: Secondary | ICD-10-CM | POA: Diagnosis present

## 2019-01-18 DIAGNOSIS — Z9221 Personal history of antineoplastic chemotherapy: Secondary | ICD-10-CM | POA: Diagnosis not present

## 2019-01-18 DIAGNOSIS — I2781 Cor pulmonale (chronic): Secondary | ICD-10-CM | POA: Diagnosis present

## 2019-01-18 DIAGNOSIS — I480 Paroxysmal atrial fibrillation: Secondary | ICD-10-CM | POA: Diagnosis present

## 2019-01-18 DIAGNOSIS — Z853 Personal history of malignant neoplasm of breast: Secondary | ICD-10-CM | POA: Diagnosis not present

## 2019-01-18 HISTORY — PX: RIGHT/LEFT HEART CATH AND CORONARY ANGIOGRAPHY: CATH118266

## 2019-01-18 LAB — POCT I-STAT 7, (LYTES, BLD GAS, ICA,H+H)
ACID-BASE DEFICIT: 1 mmol/L (ref 0.0–2.0)
Bicarbonate: 22.9 mmol/L (ref 20.0–28.0)
Calcium, Ion: 1.16 mmol/L (ref 1.15–1.40)
HCT: 32 % — ABNORMAL LOW (ref 36.0–46.0)
Hemoglobin: 10.9 g/dL — ABNORMAL LOW (ref 12.0–15.0)
O2 Saturation: 99 %
PH ART: 7.456 — AB (ref 7.350–7.450)
Potassium: 4.9 mmol/L (ref 3.5–5.1)
Sodium: 139 mmol/L (ref 135–145)
TCO2: 24 mmol/L (ref 22–32)
pCO2 arterial: 32.5 mmHg (ref 32.0–48.0)
pO2, Arterial: 115 mmHg — ABNORMAL HIGH (ref 83.0–108.0)

## 2019-01-18 LAB — POCT I-STAT EG7
Bicarbonate: 24.6 mmol/L (ref 20.0–28.0)
Calcium, Ion: 1.2 mmol/L (ref 1.15–1.40)
HCT: 33 % — ABNORMAL LOW (ref 36.0–46.0)
Hemoglobin: 11.2 g/dL — ABNORMAL LOW (ref 12.0–15.0)
O2 Saturation: 67 %
PH VEN: 7.418 (ref 7.250–7.430)
Potassium: 4.9 mmol/L (ref 3.5–5.1)
Sodium: 140 mmol/L (ref 135–145)
TCO2: 26 mmol/L (ref 22–32)
pCO2, Ven: 38.1 mmHg — ABNORMAL LOW (ref 44.0–60.0)
pO2, Ven: 34 mmHg (ref 32.0–45.0)

## 2019-01-18 LAB — MRSA PCR SCREENING: MRSA by PCR: NEGATIVE

## 2019-01-18 LAB — BASIC METABOLIC PANEL
Anion gap: 12 (ref 5–15)
BUN: 28 mg/dL — ABNORMAL HIGH (ref 8–23)
CHLORIDE: 104 mmol/L (ref 98–111)
CO2: 21 mmol/L — ABNORMAL LOW (ref 22–32)
CREATININE: 1.22 mg/dL — AB (ref 0.44–1.00)
Calcium: 9 mg/dL (ref 8.9–10.3)
GFR calc Af Amer: 50 mL/min — ABNORMAL LOW (ref >60)
GFR calc non Af Amer: 43 mL/min — ABNORMAL LOW (ref >60)
Glucose, Bld: 143 mg/dL — ABNORMAL HIGH (ref 70–99)
Potassium: 4.8 mmol/L (ref 3.5–5.1)
Sodium: 137 mmol/L (ref 135–145)

## 2019-01-18 SURGERY — RIGHT/LEFT HEART CATH AND CORONARY ANGIOGRAPHY
Anesthesia: LOCAL

## 2019-01-18 MED ORDER — FENTANYL CITRATE (PF) 100 MCG/2ML IJ SOLN
INTRAMUSCULAR | Status: AC
  Filled 2019-01-18: qty 2

## 2019-01-18 MED ORDER — VERAPAMIL HCL 2.5 MG/ML IV SOLN
INTRAVENOUS | Status: AC
  Filled 2019-01-18: qty 2

## 2019-01-18 MED ORDER — HEPARIN (PORCINE) IN NACL 1000-0.9 UT/500ML-% IV SOLN
INTRAVENOUS | Status: AC
  Filled 2019-01-18: qty 1000

## 2019-01-18 MED ORDER — MIDAZOLAM HCL 2 MG/2ML IJ SOLN
INTRAMUSCULAR | Status: AC
  Filled 2019-01-18: qty 2

## 2019-01-18 MED ORDER — HEPARIN SODIUM (PORCINE) 1000 UNIT/ML IJ SOLN
INTRAMUSCULAR | Status: DC | PRN
  Administered 2019-01-18: 6000 [IU] via INTRAVENOUS

## 2019-01-18 MED ORDER — SODIUM CHLORIDE 0.9% FLUSH: 3.0000 mL | INTRAVENOUS | Status: DC | PRN

## 2019-01-18 MED ORDER — LIDOCAINE HCL (PF) 1 % IJ SOLN
INTRAMUSCULAR | Status: DC | PRN
  Administered 2019-01-18 (×2): 2 mL via INTRADERMAL

## 2019-01-18 MED ORDER — FUROSEMIDE 10 MG/ML IJ SOLN
20.0000 mg | Freq: Every day | INTRAMUSCULAR | Status: DC
  Administered 2019-01-18: 20 mg via INTRAVENOUS
  Filled 2019-01-18 (×2): qty 2

## 2019-01-18 MED ORDER — SODIUM CHLORIDE 0.9 % IV SOLN: 250.0000 mL | INTRAVENOUS | Status: DC | PRN

## 2019-01-18 MED ORDER — FENTANYL CITRATE (PF) 100 MCG/2ML IJ SOLN
INTRAMUSCULAR | Status: DC | PRN
  Administered 2019-01-18: 25 ug via INTRAVENOUS

## 2019-01-18 MED ORDER — LIDOCAINE HCL (PF) 1 % IJ SOLN
INTRAMUSCULAR | Status: AC
  Filled 2019-01-18: qty 30

## 2019-01-18 MED ORDER — IOHEXOL 350 MG/ML SOLN
INTRAVENOUS | Status: DC | PRN
  Administered 2019-01-18: 50 mL via INTRA_ARTERIAL

## 2019-01-18 MED ORDER — SODIUM CHLORIDE 0.9% FLUSH
3.0000 mL | Freq: Two times a day (BID) | INTRAVENOUS | Status: DC
  Administered 2019-01-18 – 2019-01-20 (×5): 3 mL via INTRAVENOUS

## 2019-01-18 MED ORDER — HEPARIN SODIUM (PORCINE) 1000 UNIT/ML IJ SOLN
INTRAMUSCULAR | Status: AC
  Filled 2019-01-18: qty 1

## 2019-01-18 MED ORDER — VERAPAMIL HCL 2.5 MG/ML IV SOLN
INTRAVENOUS | Status: DC | PRN
  Administered 2019-01-18: 10 mL via INTRA_ARTERIAL

## 2019-01-18 MED ORDER — MIDAZOLAM HCL 2 MG/2ML IJ SOLN
INTRAMUSCULAR | Status: DC | PRN
  Administered 2019-01-18: 1 mg via INTRAVENOUS

## 2019-01-18 MED ORDER — HEPARIN (PORCINE) IN NACL 1000-0.9 UT/500ML-% IV SOLN
INTRAVENOUS | Status: DC | PRN
  Administered 2019-01-18 (×2): 500 mL

## 2019-01-18 SURGICAL SUPPLY — 11 items

## 2019-01-18 NOTE — Progress Notes (Signed)
Progress Note    Leslie Alexander  RWE:[id number] DOB: 10/09/1941  DOA: 01/16/2019 PCP: Richardean Chimera, MD    Brief Narrative:     Medical records reviewed and are as summarized below:  Leslie Alexander is an 78 y.o. female with past medical history of persistent atrial fibrillation (on Eliquis for anticoagulation), CHF, chronic LBBB, mild nonobstructive CAD by cath in 2016, HTN, OSA(not compliant with CPAP), Pulmonary HTN, and Stage 3 CKD who was  admitted on 01/16/2019 with dyspnea and concerns about anginal equivalent, being transferred from [facility] to [facility] and s/p left and right heart cath on 01/18/2019.  Assessment/Plan:   Principal Problem:   Dyspnea/possible Angina equivalent Active Problems:   PAF (paroxysmal atrial fibrillation) (HCC)   Essential hypertension   Obesity, Class III, BMI 40-49.9 (morbid obesity) (HCC)   Acute on chronic combined systolic and diastolic CHF (congestive heart failure) (HCC)   Anticoagulant long-term use/Eliquis for Afib   Acute CHF (congestive heart failure) (HCC)  Dyspnea/possible Angina equivalent -ruled out for ACS already by EKG and troponins  -s/p right and left heart cath:LV end diastolic pressure is mildly elevated. Hemodynamic findings consistent with moderate pulmonary hypertension.  HFrEF -patient with history of chronic combined systolic and diastolic CHF---- Recent ECHO shows decline in EF- 01/12/19- now40 to 45%, from 11/2014-50 to 55%----also the repeat echo showed moderate MR and moderate to severe TR,  BNP elevated to 851, however clinically and radiologically no evidence of significant volume overload at this time , she has been compliant with torsemide PTA , also used metolazone for couple of days PTA , patient reports about a 5 pound weight gain recently, review of records reveals about 7 pound weight gain since October 2019- -resume IV lasix at a low dose post-cath  Persistent AFib -continue metoprolol Xl 50 mg daily for  rate control -last dose of Eliquis was 01/16/2019 p.m.-- resume eliquis in AM   OSA -has not been compliant with CPAP machine in over 5 years, will need repeat sleep study as outpatient  CKD III - baseline creatinine 1.3-1.4 -monitor closely with IV diuresis  Hypothyroidism - TSH is 1.7, continue levothyroxine 100 mcg daily  Morbid obesity Body mass index is 43.67 kg/m.   Family Communication/Anticipated D/C date and plan/Code Status   DVT prophylaxis: resume eliquis in AM Code Status: Full Code.  Family Communication:  Disposition Plan: pending diuresis   Medical Consultants:    cardiology  Subjective:   hungry  Objective:    Vitals:   01/18/19 1145 01/18/19 1200 01/18/19 1215 01/18/19 1230  BP: 117/68 117/71 133/68 117/80  Pulse: 65 66 63 66  Resp:      Temp: (!) 97.5 F (36.4 C)     TempSrc: Oral     SpO2: 91%     Weight:      Height:        Intake/Output Summary (Last 24 hours) at 01/18/2019 1547 Last data filed at 01/18/2019 0546 Gross per 24 hour  Intake 246.41 ml  Output -  Net 246.41 ml   Filed Weights   01/17/19 0603 01/17/19 1849 01/18/19 0507  Weight: 121.3 kg 118.8 kg 119 kg    Exam: In chair, fine tremor at rest Obese female +JVD irr Diminished due to body habitus +LE edema   Data Reviewed:   I have personally reviewed following labs and imaging studies:  Labs: Labs show the following:   Basic Metabolic Panel: Recent Labs  Lab 01/16/19 1328 01/17/19  0414 01/18/19 0531  NA 136 140 137  K 4.4 4.8 4.8  CL 105 106 104  CO2 22 25 21*  GLUCOSE 110* 96 143*  BUN 40* 37* 28*  CREATININE 1.34* 1.27* 1.22*  CALCIUM 8.5* 8.9 9.0  MG 2.2  --   --    GFR Estimated Creatinine Clearance: 51.5 mL/min (A) (by C-G formula based on SCr of 1.22 mg/dL (H)). Liver Function Tests: Recent Labs  Lab 01/16/19 1328  AST 42*  ALT 52*  ALKPHOS 172*  BILITOT 0.8  PROT 6.3*  ALBUMIN 3.5   No results for input(s): LIPASE,  AMYLASE in the last 168 hours. No results for input(s): AMMONIA in the last 168 hours. Coagulation profile No results for input(s): INR, PROTIME in the last 168 hours.  CBC: Recent Labs  Lab 01/16/19 1328  WBC 5.9  NEUTROABS 3.9  HGB 11.5*  HCT 39.2  MCV 97.3  PLT 152   Cardiac Enzymes: Recent Labs  Lab 01/16/19 1328 01/16/19 1707 01/16/19 2258  TROPONINI <0.03 <0.03 <0.03   BNP (last 3 results) No results for input(s): PROBNP in the last 8760 hours. CBG: No results for input(s): GLUCAP in the last 168 hours. D-Dimer: No results for input(s): DDIMER in the last 72 hours. Hgb A1c: No results for input(s): HGBA1C in the last 72 hours. Lipid Profile: No results for input(s): CHOL, HDL, LDLCALC, TRIG, CHOLHDL, LDLDIRECT in the last 72 hours. Thyroid function studies: Recent Labs    01/16/19 1328  TSH 1.768   Anemia work up: No results for input(s): VITAMINB12, FOLATE, FERRITIN, TIBC, IRON, RETICCTPCT in the last 72 hours. Sepsis Labs: Recent Labs  Lab 01/16/19 1328  WBC 5.9    Microbiology Recent Results (from the past 240 hour(s))  MRSA PCR Screening     Status: None   Collection Time: 01/17/19 11:37 PM  Result Value Ref Range Status   MRSA by PCR NEGATIVE NEGATIVE Final    Comment:        The GeneXpert MRSA Assay (FDA approved for NASAL specimens only), is one component of a comprehensive MRSA colonization surveillance program. It is not intended to diagnose MRSA infection nor to guide or monitor treatment for MRSA infections. Performed at [facility], [address] [place], [place] [address]     Procedures and diagnostic studies:  No results found.  Medications:   . allopurinol  300 mg Oral Daily  . aspirin  81 mg Oral Q breakfast  . atorvastatin  20 mg Oral q1800  . busPIRone  15 mg Oral BID  . furosemide  20 mg Intravenous Daily  . gabapentin  300 mg Oral TID  . levothyroxine  100 mcg Oral Q0600  . metoprolol succinate  50 mg  Oral Daily  . pantoprazole  40 mg Oral Daily  . potassium chloride SA  40 mEq Oral BID  . primidone  50 mg Oral BID  . sodium chloride flush  3 mL Intravenous Q12H  . traZODone  50 mg Oral QHS   Continuous Infusions: . sodium chloride       LOS: 0 days   Joseph Art  [facility]   How to contact the [facility] Attending or Consulting provider 7A - 7P or covering provider during after hours 7P -7A, for this patient?  1. Check the care team in [facility] and look for a) attending/consulting TR[facility] provider listed and b) the [facility] team listed 2. Log into [email or web address] and use [facility] universal  password to access. If you do not have the password, please contact the hospital operator. 3. Locate the [facility] provider you are looking for under [facility] and page to a number that you can be directly reached. 4. If you still have difficulty reaching the provider, please page the [facility] (Director on Call) for the Hospitalists listed on amion for assistance.  01/18/2019, 3:47 PM

## 2019-01-18 NOTE — Interval H&P Note (Signed)
History and Physical Interval Note:  01/18/2019 10:15 AM  Leslie Alexander  has presented today for surgery, with the diagnosis of CM  The various methods of treatment have been discussed with the patient and family. After consideration of risks, benefits and other options for treatment, the patient has consented to  Procedure(s): RIGHT/LEFT HEART CATH AND CORONARY ANGIOGRAPHY (N/A) as a surgical intervention .  The patient's history has been reviewed, patient examined, no change in status, stable for surgery.  I have reviewed the patient's chart and labs.  Questions were answered to the patient's satisfaction.   Cath Lab Visit (complete for each Cath Lab visit)  Clinical Evaluation Leading to the Procedure:   ACS: No.  Non-ACS:    Anginal Classification: CCS I  Anti-ischemic medical therapy: Minimal Therapy (1 class of medications)  Non-Invasive Test Results: No non-invasive testing performed  Prior CABG: No previous CABG        Theron Arista [facility] 01/18/2019 10:15 AM

## 2019-01-18 NOTE — Progress Notes (Signed)
Progress Note  Patient Name: Leslie Alexander Date of Encounter: 01/18/2019  Primary Cardiologist: Prentice Docker, MD   Subjective   Denies dyspnea at rest, but developed shortness of breath just walking to the bathroom.  No angina.  Inpatient Medications    Scheduled Meds: . allopurinol  300 mg Oral Daily  . aspirin  81 mg Oral Q breakfast  . atorvastatin  20 mg Oral q1800  . busPIRone  15 mg Oral BID  . gabapentin  300 mg Oral TID  . levothyroxine  100 mcg Oral Q0600  . metoprolol succinate  50 mg Oral Daily  . pantoprazole  40 mg Oral Daily  . potassium chloride SA  40 mEq Oral BID  . primidone  50 mg Oral BID  . sodium chloride flush  3 mL Intravenous Q12H  . traZODone  50 mg Oral QHS   Continuous Infusions: . sodium chloride    . sodium chloride 10 mL/hr at 01/18/19 0546   PRN Meds: sodium chloride, acetaminophen **OR** acetaminophen, ondansetron **OR** ondansetron (ZOFRAN) IV, polyethylene glycol, sodium chloride flush   Vital Signs    Vitals:   01/18/19 0040 01/18/19 0501 01/18/19 0507 01/18/19 0827  BP: (!) 111/45 131/79  134/76  Pulse: 81 69  69  Resp: 18 18    Temp: 97.7 F (36.5 C) (!) 97.5 F (36.4 C)    TempSrc: Oral Oral    SpO2: 92% 96%    Weight:   119 kg   Height:        Intake/Output Summary (Last 24 hours) at 01/18/2019 0925 Last data filed at 01/18/2019 0546 Gross per 24 hour  Intake 486.41 ml  Output -  Net 486.41 ml   Last 3 Weights 01/18/2019 01/17/2019 01/17/2019  Weight (lbs) 262 lb 6.4 oz 262 lb 267 lb 6.7 oz  Weight (kg) 119.024 kg 118.842 kg 121.3 kg      Telemetry    Atrial fibrillation with controlled rate - Personally Reviewed  ECG    Atrial fibrillation, left bundle branch block- Personally Reviewed  Physical Exam  Obese GEN: No acute distress.   Neck: 8-9 cm JVD Cardiac:  Irregular, paradoxically split second heart sound, 2/6 holosystolic murmur left lower sternal border, no diastolic murmurs, rubs, or  gallops.  Respiratory:  Reduced breath sounds and a few crackles primarily in the right base, less prominent on the left, partially clear with cough, s/p left mastectomy GI: Soft, nontender, non-distended  MS:  Reveal symmetrical ankle edema; No deformity. Neuro:  Nonfocal  Psych: Normal affect   Labs    Chemistry Recent Labs  Lab 01/16/19 1328 01/17/19 0414 01/18/19 0531  NA 136 140 137  K 4.4 4.8 4.8  CL 105 106 104  CO2 22 25 21*  GLUCOSE 110* 96 143*  BUN 40* 37* 28*  CREATININE 1.34* 1.27* 1.22*  CALCIUM 8.5* 8.9 9.0  PROT 6.3*  --   --   ALBUMIN 3.5  --   --   AST 42*  --   --   ALT 52*  --   --   ALKPHOS 172*  --   --   BILITOT 0.8  --   --   GFRNONAA 39* 41* 43*  GFRAA 45* 48* 50*  ANIONGAP 9 9 12      Hematology Recent Labs  Lab 01/16/19 1328  WBC 5.9  RBC 4.03  HGB 11.5*  HCT 39.2  MCV 97.3  MCH 28.5  MCHC 29.3*  RDW 16.2*  PLT 152    Cardiac Enzymes Recent Labs  Lab 01/16/19 1328 01/16/19 1707 01/16/19 2258  TROPONINI <0.03 <0.03 <0.03   No results for input(s): TROPIPOC in the last 168 hours.   BNP Recent Labs  Lab 01/16/19 1328  BNP 700.0*     DDimer No results for input(s): DDIMER in the last 168 hours.   Radiology    Dg Chest 2 View  Result Date: 01/16/2019 CLINICAL DATA:  Chest tightness and shortness of breath today. History of CHF. EXAM: CHEST - 2 VIEW COMPARISON:  Chest x-rays dated 01/05/2019 and 08/17/2018. FINDINGS: Stable cardiomegaly. Coarse interstitial markings bilaterally, not significantly changed compared to multiple prior studies. No evidence of alveolar pulmonary edema. No confluent opacity to suggest a developing pneumonia. No pleural effusion or pneumothorax seen. Osseous structures about the chest are unremarkable. IMPRESSION: 1. No acute findings. No evidence of pneumonia or alveolar pulmonary edema. 2. Stable cardiomegaly. 3. Coarse interstitial markings, stable compared to previous exams, most likely chronic  mild interstitial edema. Electronically Signed   By: Bary Richard M.D.   On: 01/16/2019 14:22    Cardiac Studies    1. The cavity size was mildly dilated. There is mildly increased left ventricular wall thickness. Left ventricular ejection fraction of 40-45% with septal motion suggestive of LBBB. Left ventricular diastolic Doppler parameters are indeterminate. There  is right ventricular volume overload.  2. The right ventricle has mildly reduced systolic function. The cavity was moderately enlarged. There is no increase in right ventricular wall thickness. Right ventricular systolic pressure is moderately elevated with an estimated pressure of 54.4  mmHg.  3. The aortic valve is tricuspid Aortic valve regurgitation is trivial by color flow Doppler.. Mild aortic annular calcification noted.  4. The mitral valve is normal in structure. There is mild mitral annular calcification present. Mitral valve regurgitation is moderate by color flow Doppler.  5. The tricuspid valve is normal in structure. Tricuspid valve regurgitation is moderate-severe.  6. The aortic root is normal in size and structure.  7. Right atrial size was moderately dilated.  8. Left atrial size was mildly dilated.  9. The inferior vena cava was dilated in size with <50% respiratory variability.  Patient Profile     78 y.o. female presenting with acute heart failure and newly decreased left ventricular systolic function (LVEF 40-45%), severe tricuspid regurgitation (secondary?), persistent atrial fibrillation with good ventricular rate control, nonobstructive coronary lesions by cardiac catheterization 2016, CKD stage III, OSA not using CPAP.  Assessment & Plan    1. CHF: Newly decreased LVEF.  Clearly volume overloaded.  For right and left heart catheterization today, followed by additional diuresis. This procedure has been fully reviewed with the patient and written informed consent has been obtained. 2. CAD: Denies angina  pectoris.  Had widespread atherosclerosis without meaningful stenosis by cardiac catheterization in 2016. 3. OSA/obesity: Could explain the severe tricuspid regurgitation and moderate pulmonary hypertension, independent of left heart failure. 4. PAH/cor pulmonale/severe TR: Hard to distinguish the relative contribution of the left heart failure versus ventilatory problems.  Right heart catheterization will help. 5. AFib: Well rate controlled.  On chronic anticoagulation, Eliquis held yesterday afternoon in anticipation of cardiac catheterization. 6.  CKD 3: Creatinine appears to be at baseline or even a little better than baseline.  Hold off additional diuretics until after heart catheterization. 7. LBBB: EF is not low enough to consider CRT at this point. 8. Hx L breast Ca: Mastectomy 30 years ago, had chemotherapy but  no XRT.     For questions or updates, please contact [facility] Please consult [email or web address] for contact info under        Signed, Rasheedah Reis, MD  01/18/2019, 9:25 AM    

## 2019-01-18 NOTE — H&P (View-Only) (Signed)
Progress Note  Patient Name: Leslie Alexander Date of Encounter: 01/18/2019  Primary Cardiologist: Prentice Docker, MD   Subjective   Denies dyspnea at rest, but developed shortness of breath just walking to the bathroom.  No angina.  Inpatient Medications    Scheduled Meds: . allopurinol  300 mg Oral Daily  . aspirin  81 mg Oral Q breakfast  . atorvastatin  20 mg Oral q1800  . busPIRone  15 mg Oral BID  . gabapentin  300 mg Oral TID  . levothyroxine  100 mcg Oral Q0600  . metoprolol succinate  50 mg Oral Daily  . pantoprazole  40 mg Oral Daily  . potassium chloride SA  40 mEq Oral BID  . primidone  50 mg Oral BID  . sodium chloride flush  3 mL Intravenous Q12H  . traZODone  50 mg Oral QHS   Continuous Infusions: . sodium chloride    . sodium chloride 10 mL/hr at 01/18/19 0546   PRN Meds: sodium chloride, acetaminophen **OR** acetaminophen, ondansetron **OR** ondansetron (ZOFRAN) IV, polyethylene glycol, sodium chloride flush   Vital Signs    Vitals:   01/18/19 0040 01/18/19 0501 01/18/19 0507 01/18/19 0827  BP: (!) 111/45 131/79  134/76  Pulse: 81 69  69  Resp: 18 18    Temp: 97.7 F (36.5 C) (!) 97.5 F (36.4 C)    TempSrc: Oral Oral    SpO2: 92% 96%    Weight:   119 kg   Height:        Intake/Output Summary (Last 24 hours) at 01/18/2019 0925 Last data filed at 01/18/2019 0546 Gross per 24 hour  Intake 486.41 ml  Output -  Net 486.41 ml   Last 3 Weights 01/18/2019 01/17/2019 01/17/2019  Weight (lbs) 262 lb 6.4 oz 262 lb 267 lb 6.7 oz  Weight (kg) 119.024 kg 118.842 kg 121.3 kg      Telemetry    Atrial fibrillation with controlled rate - Personally Reviewed  ECG    Atrial fibrillation, left bundle branch block- Personally Reviewed  Physical Exam  Obese GEN: No acute distress.   Neck: 8-9 cm JVD Cardiac:  Irregular, paradoxically split second heart sound, 2/6 holosystolic murmur left lower sternal border, no diastolic murmurs, rubs, or  gallops.  Respiratory:  Reduced breath sounds and a few crackles primarily in the right base, less prominent on the left, partially clear with cough, s/p left mastectomy GI: Soft, nontender, non-distended  MS:  Reveal symmetrical ankle edema; No deformity. Neuro:  Nonfocal  Psych: Normal affect   Labs    Chemistry Recent Labs  Lab 01/16/19 1328 01/17/19 0414 01/18/19 0531  NA 136 140 137  K 4.4 4.8 4.8  CL 105 106 104  CO2 22 25 21*  GLUCOSE 110* 96 143*  BUN 40* 37* 28*  CREATININE 1.34* 1.27* 1.22*  CALCIUM 8.5* 8.9 9.0  PROT 6.3*  --   --   ALBUMIN 3.5  --   --   AST 42*  --   --   ALT 52*  --   --   ALKPHOS 172*  --   --   BILITOT 0.8  --   --   GFRNONAA 39* 41* 43*  GFRAA 45* 48* 50*  ANIONGAP 9 9 12      Hematology Recent Labs  Lab 01/16/19 1328  WBC 5.9  RBC 4.03  HGB 11.5*  HCT 39.2  MCV 97.3  MCH 28.5  MCHC 29.3*  RDW 16.2*  PLT 152    Cardiac Enzymes Recent Labs  Lab 01/16/19 1328 01/16/19 1707 01/16/19 2258  TROPONINI <0.03 <0.03 <0.03   No results for input(s): TROPIPOC in the last 168 hours.   BNP Recent Labs  Lab 01/16/19 1328  BNP 700.0*     DDimer No results for input(s): DDIMER in the last 168 hours.   Radiology    Dg Chest 2 View  Result Date: 01/16/2019 CLINICAL DATA:  Chest tightness and shortness of breath today. History of CHF. EXAM: CHEST - 2 VIEW COMPARISON:  Chest x-rays dated 01/05/2019 and 08/17/2018. FINDINGS: Stable cardiomegaly. Coarse interstitial markings bilaterally, not significantly changed compared to multiple prior studies. No evidence of alveolar pulmonary edema. No confluent opacity to suggest a developing pneumonia. No pleural effusion or pneumothorax seen. Osseous structures about the chest are unremarkable. IMPRESSION: 1. No acute findings. No evidence of pneumonia or alveolar pulmonary edema. 2. Stable cardiomegaly. 3. Coarse interstitial markings, stable compared to previous exams, most likely chronic  mild interstitial edema. Electronically Signed   By: Bary Richard M.D.   On: 01/16/2019 14:22    Cardiac Studies    1. The cavity size was mildly dilated. There is mildly increased left ventricular wall thickness. Left ventricular ejection fraction of 40-45% with septal motion suggestive of LBBB. Left ventricular diastolic Doppler parameters are indeterminate. There  is right ventricular volume overload.  2. The right ventricle has mildly reduced systolic function. The cavity was moderately enlarged. There is no increase in right ventricular wall thickness. Right ventricular systolic pressure is moderately elevated with an estimated pressure of 54.4  mmHg.  3. The aortic valve is tricuspid Aortic valve regurgitation is trivial by color flow Doppler.. Mild aortic annular calcification noted.  4. The mitral valve is normal in structure. There is mild mitral annular calcification present. Mitral valve regurgitation is moderate by color flow Doppler.  5. The tricuspid valve is normal in structure. Tricuspid valve regurgitation is moderate-severe.  6. The aortic root is normal in size and structure.  7. Right atrial size was moderately dilated.  8. Left atrial size was mildly dilated.  9. The inferior vena cava was dilated in size with <50% respiratory variability.  Patient Profile     78 y.o. female presenting with acute heart failure and newly decreased left ventricular systolic function (LVEF 40-45%), severe tricuspid regurgitation (secondary?), persistent atrial fibrillation with good ventricular rate control, nonobstructive coronary lesions by cardiac catheterization 2016, CKD stage III, OSA not using CPAP.  Assessment & Plan    1. CHF: Newly decreased LVEF.  Clearly volume overloaded.  For right and left heart catheterization today, followed by additional diuresis. This procedure has been fully reviewed with the patient and written informed consent has been obtained. 2. CAD: Denies angina  pectoris.  Had widespread atherosclerosis without meaningful stenosis by cardiac catheterization in 2016. 3. OSA/obesity: Could explain the severe tricuspid regurgitation and moderate pulmonary hypertension, independent of left heart failure. 4. PAH/cor pulmonale/severe TR: Hard to distinguish the relative contribution of the left heart failure versus ventilatory problems.  Right heart catheterization will help. 5. AFib: Well rate controlled.  On chronic anticoagulation, Eliquis held yesterday afternoon in anticipation of cardiac catheterization. 6.  CKD 3: Creatinine appears to be at baseline or even a little better than baseline.  Hold off additional diuretics until after heart catheterization. 7. LBBB: EF is not low enough to consider CRT at this point. 8. Hx L breast Ca: Mastectomy 30 years ago, had chemotherapy but  no XRT.     For questions or updates, please contact [facility] Please consult [email or web address] for contact info under        Signed, Thurmon Fair, MD  01/18/2019, 9:25 AM

## 2019-01-19 LAB — CBC
HCT: 40.6 % (ref 36.0–46.0)
Hemoglobin: 12.2 g/dL (ref 12.0–15.0)
MCH: 28.8 pg (ref 26.0–34.0)
MCHC: 30 g/dL (ref 30.0–36.0)
MCV: 95.8 fL (ref 80.0–100.0)
Platelets: 154 10*3/uL (ref 150–400)
RBC: 4.24 MIL/uL (ref 3.87–5.11)
RDW: 16.3 % — ABNORMAL HIGH (ref 11.5–15.5)
WBC: 7 10*3/uL (ref 4.0–10.5)
nRBC: 0 % (ref 0.0–0.2)

## 2019-01-19 LAB — BASIC METABOLIC PANEL
Anion gap: 8 (ref 5–15)
BUN: 25 mg/dL — ABNORMAL HIGH (ref 8–23)
CO2: 23 mmol/L (ref 22–32)
Calcium: 9.1 mg/dL (ref 8.9–10.3)
Chloride: 106 mmol/L (ref 98–111)
Creatinine, Ser: 1.26 mg/dL — ABNORMAL HIGH (ref 0.44–1.00)
GFR calc Af Amer: 48 mL/min — ABNORMAL LOW (ref >60)
GFR calc non Af Amer: 42 mL/min — ABNORMAL LOW (ref >60)
Glucose, Bld: 140 mg/dL — ABNORMAL HIGH (ref 70–99)
POTASSIUM: 5.7 mmol/L — AB (ref 3.5–5.1)
Sodium: 137 mmol/L (ref 135–145)

## 2019-01-19 MED ORDER — DULOXETINE HCL 60 MG PO CPEP
60.0000 mg | ORAL_CAPSULE | Freq: Every day | ORAL | Status: DC
  Administered 2019-01-19 – 2019-01-20 (×2): 60 mg via ORAL
  Filled 2019-01-19 (×3): qty 1

## 2019-01-19 MED ORDER — APIXABAN 5 MG PO TABS
5.0000 mg | ORAL_TABLET | Freq: Two times a day (BID) | ORAL | Status: DC
  Administered 2019-01-19 – 2019-01-20 (×3): 5 mg via ORAL
  Filled 2019-01-19 (×3): qty 1

## 2019-01-19 MED ORDER — FUROSEMIDE 10 MG/ML IJ SOLN
40.0000 mg | Freq: Two times a day (BID) | INTRAMUSCULAR | Status: AC
  Administered 2019-01-19 (×2): 40 mg via INTRAVENOUS
  Filled 2019-01-19 (×2): qty 4

## 2019-01-19 NOTE — Progress Notes (Signed)
PROGRESS NOTE    Leslie Alexander  WHQ:[id number] DOB: [birth year] DOA: 01/16/2019 PCP: Richardean Chimera, MD     Brief Narrative:  Leslie Alexander is a 78 y.o. female with past medical history of persistent atrial fibrillation (on Eliquis for anticoagulation), CHF, chronic LBBB, mild nonobstructive CAD by cath in 2016, HTN, OSA(not compliant with CPAP), Pulmonary HTN, and Stage 3 CKDwho wasadmitted on 01/16/2019 with dyspnea and concerns about anginal equivalent,was transferred [facility] to [facility] and s/p left and right heart cath on 01/18/2019.  New events last 24 hours / Subjective: Feeling well, although continues to have shortness of breath with minimal exertion.  Has a dry cough as well.  Denies any chest pain.   Assessment & Plan:   Principal Problem:   Dyspnea/possible Angina equivalent Active Problems:   PAF (paroxysmal atrial fibrillation) (HCC)   Essential hypertension   Obesity, Class III, BMI 40-49.9 (morbid obesity) (HCC)   Acute on chronic combined systolic and diastolic CHF (congestive heart failure) (HCC)   Anticoagulant long-term use/Eliquis for Afib   Acute CHF (congestive heart failure) (HCC)   Acute systolic CHF  -Lasix 40 mg IV every 12 hours -Appreciate cardiology  -Strict I's and O's, daily weight  CAD -S/p right and left heart cath: LV end diastolic pressure is mildly elevated. Hemodynamic findings consistent with moderate pulmonary hypertension.  Minor nonobstructive CAD  PersistentAFib -Continue Toprol, eliquis   Hyperlipidemia -Continue Lipitor  OSA -Has not been compliant with CPAP machine in over 5 years, will need repeat sleep study as outpatient. Follow up with Dr. Tresa Endo   CKD III -Baseline creatinine 1.3-1.4 -Creatinine stable, 1.26 today  Hypothyroidism -TSH is 1.7, continue levothyroxine 100 mcg daily  Depression -Continue cymbalta, BuSpar  Hyperkalemia -EKG reviewed personally, showed A. fib, no change from previous -Lasix  IV  Morbid obesity -Body mass index is 43.65 kg/m.    DVT prophylaxis: Eliquis Code Status: Full Family Communication: At bedside Disposition Plan: Pending improvement after further diuresis   Consultants:   Cardiology   Procedures:   Heart cath 2/26  Antimicrobials:  Anti-infectives (From admission, onward)   None        Objective: Vitals:   01/18/19 1923 01/19/19 0433 01/19/19 0931 01/19/19 1148  BP: (!) 134/48 137/64 (!) 139/55 121/68  Pulse: 69 69 70 67  Resp: 18 18  (!) 22  Temp: 98.9 F (37.2 C) (!) 97.5 F (36.4 C)  (!) 97.5 F (36.4 C)  TempSrc: Oral Oral  Oral  SpO2: 94% 93%  95%  Weight:  119 kg    Height:        Intake/Output Summary (Last 24 hours) at 01/19/2019 1156 Last data filed at 01/19/2019 1053 Gross per 24 hour  Intake 603 ml  Output 1900 ml  Net -1297 ml   Filed Weights   01/17/19 1849 01/18/19 0507 01/19/19 0433  Weight: 118.8 kg 119 kg 119 kg    Examination:  General exam: Appears calm and comfortable  Respiratory system: Diminished breath sounds bilaterally. Cardiovascular system: S1 & S2 heard, RRR. No JVD, murmurs, rubs, gallops or clicks.  Trace pedal edema. Gastrointestinal system: Abdomen is nondistended, soft and nontender. No organomegaly or masses felt. Normal bowel sounds heard. Central nervous system: Alert and oriented. No focal neurological deficits. Extremities: Symmetric 5 x 5 power. Skin: No rashes, lesions or ulcers Psychiatry: Judgement and insight appear normal. Mood & affect appropriate.   Data Reviewed: I have personally reviewed following labs and imaging studies  CBC: Recent Labs  Lab 01/16/19 1328 01/18/19 1030 01/18/19 1033 01/19/19 0423  WBC 5.9  --   --  7.0  NEUTROABS 3.9  --   --   --   HGB 11.5* 11.2* 10.9* 12.2  HCT 39.2 33.0* 32.0* 40.6  MCV 97.3  --   --  95.8  PLT 152  --   --  154   Basic Metabolic Panel: Recent Labs  Lab 01/16/19 1328 01/17/19 0414 01/18/19 0531  01/18/19 1030 01/18/19 1033 01/19/19 0423  NA 136 140 137 140 139 137  K 4.4 4.8 4.8 4.9 4.9 5.7*  CL 105 106 104  --   --  106  CO2 22 25 21*  --   --  23  GLUCOSE 110* 96 143*  --   --  140*  BUN 40* 37* 28*  --   --  25*  CREATININE 1.34* 1.27* 1.22*  --   --  1.26*  CALCIUM 8.5* 8.9 9.0  --   --  9.1  MG 2.2  --   --   --   --   --    GFR: Estimated Creatinine Clearance: 49.8 mL/min (A) (by C-G formula based on SCr of 1.26 mg/dL (H)). Liver Function Tests: Recent Labs  Lab 01/16/19 1328  AST 42*  ALT 52*  ALKPHOS 172*  BILITOT 0.8  PROT 6.3*  ALBUMIN 3.5   No results for input(s): LIPASE, AMYLASE in the last 168 hours. No results for input(s): AMMONIA in the last 168 hours. Coagulation Profile: No results for input(s): INR, PROTIME in the last 168 hours. Cardiac Enzymes: Recent Labs  Lab 01/16/19 1328 01/16/19 1707 01/16/19 2258  TROPONINI <0.03 <0.03 <0.03   BNP (last 3 results) No results for input(s): PROBNP in the last 8760 hours. HbA1C: No results for input(s): HGBA1C in the last 72 hours. CBG: No results for input(s): GLUCAP in the last 168 hours. Lipid Profile: No results for input(s): CHOL, HDL, LDLCALC, TRIG, CHOLHDL, LDLDIRECT in the last 72 hours. Thyroid Function Tests: Recent Labs    01/16/19 1328  TSH 1.768  FREET4 0.92   Anemia Panel: No results for input(s): VITAMINB12, FOLATE, FERRITIN, TIBC, IRON, RETICCTPCT in the last 72 hours. Sepsis Labs: No results for input(s): PROCALCITON, LATICACIDVEN in the last 168 hours.  Recent Results (from the past 240 hour(s))  MRSA PCR Screening     Status: None   Collection Time: 01/17/19 11:37 PM  Result Value Ref Range Status   MRSA by PCR NEGATIVE NEGATIVE Final    Comment:        The GeneXpert MRSA Assay (FDA approved for NASAL specimens only), is one component of a comprehensive MRSA colonization surveillance program. It is not intended to diagnose MRSA infection nor to guide  or monitor treatment for MRSA infections. Performed at [facility], [address] [place], [place] [address]        Radiology Studies: No results found.    Scheduled Meds: . allopurinol  300 mg Oral Daily  . apixaban  5 mg Oral BID  . atorvastatin  20 mg Oral q1800  . busPIRone  15 mg Oral BID  . DULoxetine  60 mg Oral Daily  . furosemide  40 mg Intravenous Q12H  . gabapentin  300 mg Oral TID  . levothyroxine  100 mcg Oral Q0600  . metoprolol succinate  50 mg Oral Daily  . pantoprazole  40 mg Oral Daily  . primidone  50  mg Oral BID  . sodium chloride flush  3 mL Intravenous Q12H  . traZODone  50 mg Oral QHS   Continuous Infusions: . sodium chloride       LOS: 1 day    Time spent: 35 minutes   Noralee Stain, DO [facility] [email or web address] 01/19/2019, 11:56 AM

## 2019-01-19 NOTE — Discharge Instructions (Signed)

## 2019-01-19 NOTE — Progress Notes (Signed)
CRITICAL VALUE ALERT  Critical Value:  Potassium 5.7  Date & Time Notied:  Not notified by lab  Provider Notified: Tama Gander  Orders Received/Actions taken: Awaiting new orders.

## 2019-01-19 NOTE — Progress Notes (Signed)
Progress Note  Patient Name: Leslie Alexander Date of Encounter: 01/19/2019  Primary Cardiologist: Prentice Docker, MD   Subjective   Reviewed cath results. No CAD, mildly elevated left heart pressures (PAWP 19), disproportionately severe PAH (PA mean 41 mm Hg, TPG 22 mm Hg).  Inpatient Medications    Scheduled Meds: . allopurinol  300 mg Oral Daily  . apixaban  5 mg Oral BID  . atorvastatin  20 mg Oral q1800  . busPIRone  15 mg Oral BID  . DULoxetine  60 mg Oral Daily  . furosemide  40 mg Intravenous Q12H  . gabapentin  300 mg Oral TID  . levothyroxine  100 mcg Oral Q0600  . metoprolol succinate  50 mg Oral Daily  . pantoprazole  40 mg Oral Daily  . primidone  50 mg Oral BID  . sodium chloride flush  3 mL Intravenous Q12H  . traZODone  50 mg Oral QHS   Continuous Infusions: . sodium chloride     PRN Meds: sodium chloride, acetaminophen **OR** acetaminophen, ondansetron **OR** ondansetron (ZOFRAN) IV, polyethylene glycol, sodium chloride flush   Vital Signs    Vitals:   01/18/19 1230 01/18/19 1646 01/18/19 1923 01/19/19 0433  BP: 117/80 117/61 (!) 134/48 137/64  Pulse: 66 78 69 69  Resp:  18 18 18   Temp:  (!) 97.5 F (36.4 C) 98.9 F (37.2 C) (!) 97.5 F (36.4 C)  TempSrc:  Oral Oral Oral  SpO2:  93% 94% 93%  Weight:    119 kg  Height:        Intake/Output Summary (Last 24 hours) at 01/19/2019 0926 Last data filed at 01/19/2019 0700 Gross per 24 hour  Intake 483 ml  Output 1450 ml  Net -967 ml   Last 3 Weights 01/19/2019 01/18/2019 01/17/2019  Weight (lbs) 262 lb 4.8 oz 262 lb 6.4 oz 262 lb  Weight (kg) 118.978 kg 119.024 kg 118.842 kg      Telemetry    AFib, rate controlled - Personally Reviewed  ECG    No new results - Personally Reviewed  Physical Exam  Obese GEN: No acute distress.   Neck: 8-10 cm JVD Cardiac: irregular, split S2, 2/6 L parasternal holosystolic murmur, no diastolic murmurs, rubs, or gallops.  Respiratory: Clear to  auscultation bilaterally. S/P L mastectomy GI: Soft, nontender, non-distended  MS: trivial ankle edema; No deformity. Moderate ecchymosis without palpable hematoma R antecubital, no problems at R radial arterial access site. Neuro:  Nonfocal  Psych: Normal affect   Labs    Chemistry Recent Labs  Lab 01/16/19 1328 01/17/19 0414 01/18/19 0531 01/18/19 1030 01/18/19 1033 01/19/19 0423  NA 136 140 137 140 139 137  K 4.4 4.8 4.8 4.9 4.9 5.7*  CL 105 106 104  --   --  106  CO2 22 25 21*  --   --  23  GLUCOSE 110* 96 143*  --   --  140*  BUN 40* 37* 28*  --   --  25*  CREATININE 1.34* 1.27* 1.22*  --   --  1.26*  CALCIUM 8.5* 8.9 9.0  --   --  9.1  PROT 6.3*  --   --   --   --   --   ALBUMIN 3.5  --   --   --   --   --   AST 42*  --   --   --   --   --   ALT 52*  --   --   --   --   --  ALKPHOS 172*  --   --   --   --   --   BILITOT 0.8  --   --   --   --   --   GFRNONAA 39* 41* 43*  --   --  42*  GFRAA 45* 48* 50*  --   --  48*  ANIONGAP 9 9 12   --   --  8     Hematology Recent Labs  Lab 01/16/19 1328 01/18/19 1030 01/18/19 1033 01/19/19 0423  WBC 5.9  --   --  7.0  RBC 4.03  --   --  4.24  HGB 11.5* 11.2* 10.9* 12.2  HCT 39.2 33.0* 32.0* 40.6  MCV 97.3  --   --  95.8  MCH 28.5  --   --  28.8  MCHC 29.3*  --   --  30.0  RDW 16.2*  --   --  16.3*  PLT 152  --   --  154    Cardiac Enzymes Recent Labs  Lab 01/16/19 1328 01/16/19 1707 01/16/19 2258  TROPONINI <0.03 <0.03 <0.03   No results for input(s): TROPIPOC in the last 168 hours.   BNP Recent Labs  Lab 01/16/19 1328  BNP 700.0*     DDimer No results for input(s): DDIMER in the last 168 hours.   Radiology    No results found.  Cardiac Studies  ECHO 01/12/2019 1. The cavity size was mildly dilated. There is mildly increased left ventricular wall thickness. Left ventricular ejection fraction of 40-45% with septal motion suggestive of LBBB. Left ventricular diastolic Doppler parameters are  indeterminate. There  is right ventricular volume overload. 2. The right ventricle has mildly reduced systolic function. The cavity was moderately enlarged. There is no increase in right ventricular wall thickness. Right ventricular systolic pressure is moderately elevated with an estimated pressure of 54.4  mmHg. 3. The aortic valve is tricuspid Aortic valve regurgitation is trivial by color flow Doppler.. Mild aortic annular calcification noted. 4. The mitral valve is normal in structure. There is mild mitral annular calcification present. Mitral valve regurgitation is moderate by color flow Doppler. 5. The tricuspid valve is normal in structure. Tricuspid valve regurgitation is moderate-severe. 6. The aortic root is normal in size and structure. 7. Right atrial size was moderately dilated. 8. Left atrial size was mildly dilated. 9. The inferior vena cava was dilated in size with <50% respiratory variability.  CATH 01/18/2019  LV end diastolic pressure is mildly elevated.  Hemodynamic findings consistent with moderate pulmonary hypertension.   1. Minor nonobstructive CAD 2. Mildly elevated LV filling pressures 3. Moderate pulmonary HTN. 4. Cardiac index 2.66.    Most Recent Value  Fick Cardiac Output 5.9 L/min  Fick Cardiac Output Index 2.66 (L/min)/BSA     RA V Wave 16 mmHg  RA Mean 14 mmHg  RV Systolic Pressure 53 mmHg  RV Diastolic Pressure 12 mmHg  RV EDP 15 mmHg  PA Systolic Pressure 54 mmHg  PA Diastolic Pressure 27 mmHg  PA Mean 41 mmHg     PW V Wave 28 mmHg  PW Mean 19 mmHg  AO Systolic Pressure 103 mmHg  AO Diastolic Pressure 60 mmHg  AO Mean 77 mmHg  LV Systolic Pressure 109 mmHg  LV Diastolic Pressure 15 mmHg  LV EDP 18 mmHg  AOp Systolic Pressure 102 mmHg  AOp Diastolic Pressure 57 mmHg  AOp Mean Pressure 75 mmHg  LVp Systolic Pressure 104 mmHg  LVp Diastolic Pressure  12 mmHg  LVp EDP Pressure 16 mmHg  QP/QS 1  TPVR Index 15.43 HRUI  TSVR  Index 28.97 HRUI  PVR SVR Ratio 0.35  TPVR/TSVR Ratio 0.53     Patient Profile     78 y.o. 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 CHF: Newly decreased LVEF.  RHF>>LHF. Still volume overloaded clinically and by elevated wedge pressure yesterday (weight unchanged last 3 days). Increase diuretic. Target a "dry weight" 255-257 lb with renal function monitoring. Discussed sodium restriction and daily weight monitoring in detail. She will probably require torsemide and may need metolazone for episodes of exacerbation. KCl supplement on hold for hyperkalemia. 2. CAD: mild plaque, no obstructive lesions. 3. OSA/obesity: likely explains most of the moderate pulmonary hypertension. Has a CPAP machine at home via [facility], but has not used it in 5 years. After DC will arrange Sleep Clinic f/u with Dr. Tresa Endo. Will try to retrieve her original sleep study and the "in-home titration" performed in 2019 via her PCP, Dr. Garner Nash. 4. PAH/cor pulmonale/severe TR: although L heart failure is contributory, mostly has intrinsic pulmonary arteriolar disease. 5. AFib: Well rate controlled.  On chronic anticoagulation. 6.  CKD 3: Creatinine appears to be at baseline or even a little better than baseline. Unchanged after angiography. 7. LBBB: EF is not low enough to consider CRT at this point. 8. Hx L breast Ca: Mastectomy 30 years ago, had chemotherapy but no XRT.     For questions or updates, please contact [facility] Please consult [email or web address] for contact info under        Signed, Thurmon Fair, MD  01/19/2019, 9:26 AM

## 2019-01-19 NOTE — Evaluation (Signed)
Physical Therapy Evaluation & Discharge Patient Details Name: Leslie Alexander MRN: [id number] DOB: Sep 16, 1941 Today's Date: 01/19/2019   History of Present Illness  Pt is a 78 y.o. female admitted to [facility] on 01/16/19 with dyspnea; worked up for CHF. Transferred to [facility] 2/26 for cardiac cath showing minor nonobstructive CAD. PMH includes PAF (on Eliquis), HTN, obesity, CHF, CKD III.    Clinical Impression  Patient evaluated by Physical Therapy with no further acute PT needs identified. PTA, pt mod indep with ambulation; lives with daughter who assists as needed. Today, pt mod indep with mobility using RW. Required intermittent standing rest breaks secondary to DOE, but pt reports breathing much improved since admission. SpO2 94-97% on RA with ambulation. All education has been completed and the patient has no further questions. Encouraged continued ambulation with RW during hospital admission. Acute PT is signing off. Thank you for this referral.   Follow Up Recommendations No PT follow up;Supervision - Intermittent    Equipment Recommendations  None recommended by PT    Recommendations for Other Services       Precautions / Restrictions Restrictions Weight Bearing Restrictions: No      Mobility  Bed Mobility Overal bed mobility: Modified Independent             General bed mobility comments: HOB elevated  Transfers Overall transfer level: Independent Equipment used: None                Ambulation/Gait Ambulation/Gait assistance: Modified independent (Device/Increase time);Supervision Gait Distance (Feet): 270 Feet Assistive device: Rolling walker (2 wheeled) Gait Pattern/deviations: Step-through pattern;Decreased stride length;Wide base of support;Trunk flexed Gait velocity: Decreased Gait velocity interpretation: 1.31 - 2.62 ft/sec, indicative of limited community ambulator General Gait Details: Ambulatory in room without device and supervision for safety. Mod  indep ambulation with RW; antalgic secondary to chronic R knee pain. Encouraged RW for ambulation while admitted  Stairs            Wheelchair Mobility    Modified Rankin (Stroke Patients Only)       Balance Overall balance assessment: No apparent balance deficits (not formally assessed)                                           Pertinent Vitals/Pain Pain Assessment: No/denies pain    Home Living Family/patient expects to be discharged to:: Private residence Living Arrangements: Children Available Help at Discharge: Family;Available 24 hours/day Type of Home: House Home Access: (threshold step)     Home Layout: One level Home Equipment: Walker - 2 wheels;Walker - 4 wheels;Bedside commode;Shower seat;Cane - single point Additional Comments: Lives with daughter who provides assist as needed    Prior Function Level of Independence: Needs assistance   Gait / Transfers Assistance Needed: Household ambulation without device. Community ambulation with rollator. Daughter does most of driving  ADL's / Homemaking Assistance Needed: Pt able to bathe and toilet self. Daughter assists with household tasks and cooking  Comments: Not on home O2     Hand Dominance        Extremity/Trunk Assessment   Upper Extremity Assessment Upper Extremity Assessment: Overall WFL for tasks assessed    Lower Extremity Assessment Lower Extremity Assessment: Overall WFL for tasks assessed    Cervical / Trunk Assessment Cervical / Trunk Assessment: Kyphotic  Communication   Communication: No difficulties  Cognition Arousal/Alertness: Awake/alert Behavior During  Therapy: WFL for tasks assessed/performed Overall Cognitive Status: Within Functional Limits for tasks assessed                                        General Comments General comments (skin integrity, edema, etc.): SpO2 94-97% on RA with ambulation    Exercises     Assessment/Plan     PT Assessment Patent does not need any further PT services  PT Problem List         PT Treatment Interventions      PT Goals (Current goals can be found in the Care Plan section)  Acute Rehab PT Goals PT Goal Formulation: All assessment and education complete, DC therapy    Frequency     Barriers to discharge        Co-evaluation               AM-PAC PT "6 Clicks" Mobility  Outcome Measure Help needed turning from your back to your side while in a flat bed without using bedrails?: None Help needed moving from lying on your back to sitting on the side of a flat bed without using bedrails?: None Help needed moving to and from a bed to a chair (including a wheelchair)?: None Help needed standing up from a chair using your arms (e.g., wheelchair or bedside chair)?: None Help needed to walk in hospital room?: None Help needed climbing 3-5 steps with a railing? : A Little 6 Click Score: 23    End of Session   Activity Tolerance: Patient tolerated treatment well Patient left: in chair;with call bell/phone within reach Nurse Communication: Mobility status PT Visit Diagnosis: Other abnormalities of gait and mobility (R26.89)    Time: 1354-1416 PT Time Calculation (min) (ACUTE ONLY): 22 min   Charges:   PT Evaluation $PT Eval Moderate Complexity: 1 Mod        Ina Homes, PT, DPT Acute Rehabilitation Services  Pager [phone] Office [phone]  Malachy Chamber 01/19/2019, 3:05 PM

## 2019-01-20 ENCOUNTER — Other Ambulatory Visit: Payer: Self-pay | Admitting: Cardiovascular Disease

## 2019-01-20 DIAGNOSIS — G4733 Obstructive sleep apnea (adult) (pediatric): Secondary | ICD-10-CM

## 2019-01-20 DIAGNOSIS — I480 Paroxysmal atrial fibrillation: Secondary | ICD-10-CM

## 2019-01-20 DIAGNOSIS — I10 Essential (primary) hypertension: Secondary | ICD-10-CM

## 2019-01-20 LAB — MAGNESIUM: Magnesium: 2 mg/dL (ref 1.7–2.4)

## 2019-01-20 LAB — BASIC METABOLIC PANEL
Anion gap: 8 (ref 5–15)
BUN: 21 mg/dL (ref 8–23)
CO2: 23 mmol/L (ref 22–32)
Calcium: 8.6 mg/dL — ABNORMAL LOW (ref 8.9–10.3)
Chloride: 105 mmol/L (ref 98–111)
Creatinine, Ser: 1.02 mg/dL — ABNORMAL HIGH (ref 0.44–1.00)
GFR calc Af Amer: >60 mL/min (ref >60)
GFR calc non Af Amer: 54 mL/min — ABNORMAL LOW (ref >60)
GLUCOSE: 119 mg/dL — AB (ref 70–99)
Potassium: 4 mmol/L (ref 3.5–5.1)
Sodium: 136 mmol/L (ref 135–145)

## 2019-01-20 MED ORDER — METOPROLOL SUCCINATE ER 50 MG PO TB24: 50.0000 mg | ORAL_TABLET | Freq: Every day | ORAL | 0 refills | Status: DC

## 2019-01-20 MED ORDER — POTASSIUM CHLORIDE CRYS ER 20 MEQ PO TBCR
20.0000 meq | EXTENDED_RELEASE_TABLET | Freq: Every day | ORAL | Status: DC
  Administered 2019-01-20: 20 meq via ORAL
  Filled 2019-01-20: qty 1

## 2019-01-20 MED ORDER — TORSEMIDE 20 MG PO TABS: 40.0000 mg | ORAL_TABLET | Freq: Two times a day (BID) | ORAL | 0 refills | Status: AC

## 2019-01-20 MED ORDER — METOLAZONE 2.5 MG PO TABS: ORAL_TABLET | ORAL | 0 refills | Status: DC

## 2019-01-20 MED ORDER — TORSEMIDE 20 MG PO TABS
40.0000 mg | ORAL_TABLET | Freq: Two times a day (BID) | ORAL | Status: DC
  Administered 2019-01-20: 40 mg via ORAL
  Filled 2019-01-20: qty 2

## 2019-01-20 MED ORDER — POTASSIUM CHLORIDE CRYS ER 20 MEQ PO TBCR: 20.0000 meq | EXTENDED_RELEASE_TABLET | Freq: Every day | ORAL | 0 refills | Status: AC

## 2019-01-20 MED ORDER — ATORVASTATIN CALCIUM 20 MG PO TABS: 20.0000 mg | ORAL_TABLET | Freq: Every day | ORAL | 0 refills | Status: AC

## 2019-01-20 MED ORDER — METOLAZONE 2.5 MG PO TABS
2.5000 mg | ORAL_TABLET | Freq: Once | ORAL | Status: AC
  Administered 2019-01-20: 2.5 mg via ORAL
  Filled 2019-01-20: qty 1

## 2019-01-20 NOTE — Progress Notes (Addendum)
Progress Note  Patient Name: Leslie Alexander Date of Encounter: 01/20/2019  Primary Cardiologist: Prentice Docker, MD   Subjective   Feels much better.  Does not have any orthopnea.  Substantial diuresis, about 2 L net negative, but surprisingly no change in weight. At home she was gaining fluid on relatively high doses of loop diuretic and required intermittent thiazide "booster".  Inpatient Medications    Scheduled Meds: . allopurinol  300 mg Oral Daily  . apixaban  5 mg Oral BID  . atorvastatin  20 mg Oral q1800  . busPIRone  15 mg Oral BID  . DULoxetine  60 mg Oral Daily  . gabapentin  300 mg Oral TID  . levothyroxine  100 mcg Oral Q0600  . metolazone  2.5 mg Oral Once  . metoprolol succinate  50 mg Oral Daily  . pantoprazole  40 mg Oral Daily  . primidone  50 mg Oral BID  . sodium chloride flush  3 mL Intravenous Q12H  . torsemide  40 mg Oral BID  . traZODone  50 mg Oral QHS   Continuous Infusions: . sodium chloride     PRN Meds: sodium chloride, acetaminophen **OR** acetaminophen, ondansetron **OR** ondansetron (ZOFRAN) IV, polyethylene glycol, sodium chloride flush   Vital Signs    Vitals:   01/19/19 1148 01/19/19 1913 01/19/19 2034 01/20/19 0606  BP: 121/68 (!) 96/49 (!) 120/46 (!) 106/51  Pulse: 67 65 73 68  Resp: (!) 22 18  18   Temp: (!) 97.5 F (36.4 C) 97.8 F (36.6 C)  97.6 F (36.4 C)  TempSrc: Oral Oral  Oral  SpO2: 95% 96%  96%  Weight:    119.4 kg  Height:        Intake/Output Summary (Last 24 hours) at 01/20/2019 0932 Last data filed at 01/20/2019 0177 Gross per 24 hour  Intake 840 ml  Output 1850 ml  Net -1010 ml   Last 3 Weights 01/20/2019 01/19/2019 01/18/2019  Weight (lbs) 263 lb 4.8 oz 262 lb 4.8 oz 262 lb 6.4 oz  Weight (kg) 119.432 kg 118.978 kg 119.024 kg      Telemetry    Fibrillation with good ventricular rate control, average rate in the 60s.- Personally Reviewed  ECG    No new tracings- Personally  Reviewed  Physical Exam  Looks very comfortable lying fully flat in bed GEN: No acute distress.   Neck: Roughly 8 cm JVD Cardiac:  Irregular, 3/6 holosystolic murmur at the left lower sternal border, no diastolic murmurs, rubs, or gallops.  Respiratory: Clear to auscultation bilaterally. GI: Soft, nontender, non-distended  MS:  Trivial ankle bilateral edema; No deformity. Neuro:  Nonfocal  Psych: Normal affect   Labs    Chemistry Recent Labs  Lab 01/16/19 1328  01/18/19 0531  01/18/19 1033 01/19/19 0423 01/20/19 0513  NA 136   < > 137   < > 139 137 136  K 4.4   < > 4.8   < > 4.9 5.7* 4.0  CL 105   < > 104  --   --  106 105  CO2 22   < > 21*  --   --  23 23  GLUCOSE 110*   < > 143*  --   --  140* 119*  BUN 40*   < > 28*  --   --  25* 21  CREATININE 1.34*   < > 1.22*  --   --  1.26* 1.02*  CALCIUM 8.5*   < >  9.0  --   --  9.1 8.6*  PROT 6.3*  --   --   --   --   --   --   ALBUMIN 3.5  --   --   --   --   --   --   AST 42*  --   --   --   --   --   --   ALT 52*  --   --   --   --   --   --   ALKPHOS 172*  --   --   --   --   --   --   BILITOT 0.8  --   --   --   --   --   --   GFRNONAA 39*   < > 43*  --   --  42* 54*  GFRAA 45*   < > 50*  --   --  48* >60  ANIONGAP 9   < > 12  --   --  8 8   < > = values in this interval not displayed.     Hematology Recent Labs  Lab 01/16/19 1328 01/18/19 1030 01/18/19 1033 01/19/19 0423  WBC 5.9  --   --  7.0  RBC 4.03  --   --  4.24  HGB 11.5* 11.2* 10.9* 12.2  HCT 39.2 33.0* 32.0* 40.6  MCV 97.3  --   --  95.8  MCH 28.5  --   --  28.8  MCHC 29.3*  --   --  30.0  RDW 16.2*  --   --  16.3*  PLT 152  --   --  154    Cardiac Enzymes Recent Labs  Lab 01/16/19 1328 01/16/19 1707 01/16/19 2258  TROPONINI <0.03 <0.03 <0.03   No results for input(s): TROPIPOC in the last 168 hours.   BNP Recent Labs  Lab 01/16/19 1328  BNP 700.0*     DDimer No results for input(s): DDIMER in the last 168 hours.   Radiology     No results found.  Cardiac Studies   ECHO 01/12/2019 1. The cavity size was mildly dilated. There is mildly increased left ventricular wall thickness. Left ventricular ejection fraction of 40-45% with septal motion suggestive of LBBB. Left ventricular diastolic Doppler parameters are indeterminate. There  is right ventricular volume overload. 2. The right ventricle has mildly reduced systolic function. The cavity was moderately enlarged. There is no increase in right ventricular wall thickness. Right ventricular systolic pressure is moderately elevated with an estimated pressure of 54.4  mmHg. 3. The aortic valve is tricuspid Aortic valve regurgitation is trivial by color flow Doppler.. Mild aortic annular calcification noted. 4. The mitral valve is normal in structure. There is mild mitral annular calcification present. Mitral valve regurgitation is moderate by color flow Doppler. 5. The tricuspid valve is normal in structure. Tricuspid valve regurgitation is moderate-severe. 6. The aortic root is normal in size and structure. 7. Right atrial size was moderately dilated. 8. Left atrial size was mildly dilated. 9. The inferior vena cava was dilated in size with <50% respiratory variability.  CATH 01/18/2019  LV end diastolic pressure is mildly elevated.  Hemodynamic findings consistent with moderate pulmonary hypertension.  1. Minor nonobstructive CAD 2. Mildly elevated LV filling pressures 3. Moderate pulmonary HTN. 4. Cardiac index 2.66.    Most Recent Value  Fick Cardiac Output 5.9 L/min  Fick Cardiac Output Index 2.66 (L/min)/BSA  RA V Wave 16 mmHg  RA Mean 14 mmHg  RV Systolic Pressure 53 mmHg  RV Diastolic Pressure 12 mmHg  RV EDP 15 mmHg  PA Systolic Pressure 54 mmHg  PA Diastolic Pressure 27 mmHg  PA Mean 41 mmHg     PW V Wave 28 mmHg  PW Mean 19 mmHg  AO Systolic Pressure 103 mmHg  AO Diastolic Pressure 60 mmHg  AO Mean 77 mmHg  LV Systolic  Pressure 109 mmHg  LV Diastolic Pressure 15 mmHg  LV EDP 18 mmHg  AOp Systolic Pressure 102 mmHg  AOp Diastolic Pressure 57 mmHg  AOp Mean Pressure 75 mmHg  LVp Systolic Pressure 104 mmHg  LVp Diastolic Pressure 12 mmHg  LVp EDP Pressure 16 mmHg  QP/QS 1  TPVR Index 15.43 HRUI  TSVR Index 28.97 HRUI  PVR SVR Ratio 0.35  TPVR/TSVR Ratio 0.53     Patient Profile     78 y.o. 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  Assessment & Plan    1. NFA:OZHYQ decreased LVEF. RHF>>LHF.  Symptoms of left heart failure have resolved, but she still has evidence of hypervolemia. Target a "dry weight" 255-257 lb with renal function monitoring. Discussed sodium restriction and daily weight monitoring in detail.  Would recommend the following regimen as an initial attempt to maintain volume status -Torsemide 40 mg twice daily -Metolazone 2.5 mg once weekly on Fridays -Potassium chloride 20 mEq daily -Additional dosing of metolazone with potassium supplement if her weight exceeds 262 pounds, preferably avoiding more than 3 doses of metolazone a week. She is compliant with daily weights and I asked her to bring a log of those to her follow-up appointment. 2. MVH:QION plaque, no obstructive lesions. 3.OSA/obesity:likely explains most of the moderate pulmonary hypertension. Has a CPAP machine at home via [facility], but has not used it in 5 years.  Reportedly a "home sleep study" was negative for sleep apnea September 2019, but I believe this is not a reliable study.  Medically she has clear evidence of obstructive sleep apnea.  She has daytime hypersomnolence, her daughter reports witnessed apnea, she is severely obese, she has prominent pulmonary hypertension and signs  of right heart failure without known structural lung disease.  After DC will arrange Sleep Clinic f/u with Dr. Tresa Endo. Will try to retrieve her original sleep study.  Will order a repeat outpatient split-night study. 4. PAH/cor pulmonale/severe GE:[id number] L heart failure is contributory, mostly has intrinsic pulmonary arteriolar disease. 5. AFib:Well rate controlled.On chronic anticoagulation. 6. CKD 3:He has continued to improve with diuresis and there is no evidence of contrast nephrotoxicity 7. LBBB:EF is not low enough to consider CRT at this point. 8. Hx L breast Ca:Mastectomy 30 years ago, had chemotherapy but no XRT. [facility] will sign off.   Medication Recommendations:   -Torsemide 40 mg twice daily -Metolazone 2.5 mg once weekly on Fridays -Potassium chloride 20 mEq daily -Additional dosing of metolazone with potassium supplement if her weight exceeds 262 pounds, preferably avoiding more than 3 doses of metolazone a week. Other recommendations (labs, testing, etc):  BMET in 1 week Follow up as an outpatient:  Dr. Purvis Sheffield in 2 weeks  For questions or updates, please contact [facility] Please consult [email or web address] for contact info under  Signed, Thurmon Fair, MD  01/20/2019, 9:32 AM

## 2019-01-20 NOTE — Evaluation (Signed)
Occupational Therapy Evaluation and Discharge Patient Details Name: Leslie Alexander MRN: [id number] DOB: 03/05/1941 Today's Date: 01/20/2019    History of Present Illness Pt is a 78 y.o. female admitted to [facility] on 01/16/19 with dyspnea; worked up for CHF. Transferred to [facility] 2/26 for cardiac cath showing minor nonobstructive CAD. PMH includes PAF (on Eliquis), HTN, obesity, CHF, CKD III.   Clinical Impression   Pt is functioning at her baseline. All education completed. No further OT needs.    Follow Up Recommendations  No OT follow up    Equipment Recommendations  None recommended by OT    Recommendations for Other Services       Precautions / Restrictions Precautions Precautions: None Restrictions Weight Bearing Restrictions: No      Mobility Bed Mobility               General bed mobility comments: received in chair  Transfers Overall transfer level: Modified independent Equipment used: None             General transfer comment: increased time, but no assist    Balance Overall balance assessment: No apparent balance deficits (not formally assessed)                                         ADL either performed or assessed with clinical judgement   ADL Overall ADL's : Modified independent                                       General ADL Comments: Educated in energy conservation and provided handout.     Vision Patient Visual Report: No change from baseline       Perception     Praxis      Pertinent Vitals/Pain Pain Assessment: No/denies pain     Hand Dominance Right   Extremity/Trunk Assessment     Lower Extremity Assessment Lower Extremity Assessment: Overall WFL for tasks assessed(tremulous at baseline)   Cervical / Trunk Assessment Cervical / Trunk Assessment: Kyphotic   Communication Communication Communication: No difficulties   Cognition Arousal/Alertness: Awake/alert Behavior During  Therapy: WFL for tasks assessed/performed Overall Cognitive Status: Within Functional Limits for tasks assessed                                     General Comments       Exercises     Shoulder Instructions      Home Living Family/patient expects to be discharged to:: Private residence Living Arrangements: Children Available Help at Discharge: Family;Available 24 hours/day Type of Home: House       Home Layout: One level     Bathroom Shower/Tub: Producer, television/film/video: Handicapped height     Home Equipment: Environmental consultant - 2 wheels;Walker - 4 wheels;Bedside commode;Shower seat;Cane - single point   Additional Comments: Lives with daughter who provides assist as needed      Prior Functioning/Environment Level of Independence: Needs assistance  Gait / Transfers Assistance Needed: Household ambulation without device. Community ambulation with rollator. Daughter does most of driving ADL's / Homemaking Assistance Needed: Pt able to bathe and toilet self. Daughter assists with household tasks and cooking   Comments: appears to lead a sedentary lifestyle,  no leisure activities reported        OT Problem List:        OT Treatment/Interventions:      OT Goals(Current goals can be found in the care plan section) Acute Rehab OT Goals Patient Stated Goal: to return home later today  OT Frequency:     Barriers to D/C:            Co-evaluation              AM-PAC OT "6 Clicks" Daily Activity     Outcome Measure Help from another person eating meals?: None Help from another person taking care of personal grooming?: None Help from another person toileting, which includes using toliet, bedpan, or urinal?: None Help from another person bathing (including washing, rinsing, drying)?: None Help from another person to put on and taking off regular upper body clothing?: None Help from another person to put on and taking off regular lower body  clothing?: None 6 Click Score: 24   End of Session    Activity Tolerance: Patient tolerated treatment well Patient left: in chair;with call bell/phone within reach  OT Visit Diagnosis: Other (comment)(decreased activity tolerance)                Time: 1005-1026 OT Time Calculation (min): 21 min Charges:  OT General Charges $OT Visit: 1 Visit OT Evaluation $OT Eval Low Complexity: 1 Low  Martie Round, OTR/L Acute Rehabilitation Services Pager: [phone] Office: [phone]  Evern Bio 01/20/2019, 12:24 PM

## 2019-01-20 NOTE — Discharge Summary (Addendum)
Physician Discharge Summary  Leslie Alexander LKH:[id number] DOB: 09-Jul-1941 DOA: 01/16/2019  PCP: Richardean Chimera, MD  Admit date: 01/16/2019 Discharge date: 01/20/2019  Admitted From: Home Disposition:  Home  Recommendations for Outpatient Follow-up:  1. Follow up with PCP in 1 week 2. Follow up with Cardiology 3/6  3. Please obtain BMP in 1 week  4. Need outpatient sleep study and encourage CPAP qhs for OSA   Discharge Condition: Stable CODE STATUS: Full  Diet recommendation: Heart healthy   Brief/Interim Summary: Leslie Alexander is a 78 y.o.femalewith past medical history of persistent atrial fibrillation (on Eliquis for anticoagulation), CHF, chronic LBBB, mild nonobstructive CAD by cath in 2016, HTN, OSA(not compliant with CPAP), Pulmonary HTN, and Stage 3 CKDwho wasadmitted on 01/16/2019 with dyspnea and concerns about anginal equivalent,was [facility] s/pleft and right heart cathon2/26/2020.  Discharge Diagnoses:  Acute on chronic combined systolic and diastolic CHF -Appreciate cardiology  -Torsemide 40 mg twice daily -Metolazone 2.5 mg once weekly on Fridays -Potassium chloride 20 mEq daily -Additional dosing of metolazone with potassium supplement if her weight exceeds 262 pounds, preferably avoiding more than 3 doses of metolazone a week.  CAD -S/p right and left heart cath: LV end diastolic pressure is mildly elevated.Hemodynamic findings consistent with moderate pulmonary hypertension.  Minor nonobstructive CAD  PersistentAFib -Continue Toprol, eliquis   Hyperlipidemia -Continue Lipitor  OSA -Has not been compliant with CPAP machine in over 5 years, will need repeat sleep study as outpatient. Follow up with Dr. Tresa Endo   CKD III -Baseline creatinine 1.3-1.4 -Creatinine stable, 1.02 this morning   Hypothyroidism -TSH is 1.7, continue levothyroxine 100 mcg daily  Depression -Continue cymbalta,  BuSpar  Hyperkalemia -Resolved   Morbid obesity -Body mass index is 43.65 kg/m.   Discharge Instructions  Discharge Instructions    (HEART FAILURE PATIENTS) Call MD:  Anytime you have any of the following symptoms: 1) 3 pound weight gain in 24 hours or 5 pounds in 1 week 2) shortness of breath, with or without a dry hacking cough 3) swelling in the hands, feet or stomach 4) if you have to sleep on extra pillows at night in order to breathe.   Complete by:  As directed    Call MD for:  difficulty breathing, headache or visual disturbances   Complete by:  As directed    Call MD for:  extreme fatigue   Complete by:  As directed    Call MD for:  persistant dizziness or light-headedness   Complete by:  As directed    Diet - low sodium heart healthy   Complete by:  As directed    Discharge instructions   Complete by:  As directed    You were cared for by a hospitalist during your hospital stay. If you have any questions about your discharge medications or the care you received while you were in the hospital after you are discharged, you can call the unit and ask to speak with the hospitalist on call if the hospitalist that took care of you is not available. Once you are discharged, your primary care physician will handle any further medical issues. Please note that NO REFILLS for any discharge medications will be authorized once you are discharged, as it is imperative that you return to your primary care physician (or establish a relationship with a primary care physician if you do not have one) for your aftercare needs so that they can reassess your need for medications and  monitor your lab values.   Increase activity slowly   Complete by:  As directed      Allergies as of 01/20/2019      Reactions   Codeine Palpitations   Latex    Ace Inhibitors Cough      Medication List    TAKE these medications   acetaminophen 325 MG tablet Commonly known as:  TYLENOL Take 650 mg by mouth 3  (three) times daily.   allopurinol 300 MG tablet Commonly known as:  ZYLOPRIM Take 300 mg daily by mouth.   apixaban 5 MG Tabs tablet Commonly known as:  ELIQUIS Take 1 tablet (5 mg total) by mouth 2 (two) times daily.   atorvastatin 20 MG tablet Commonly known as:  LIPITOR Take 1 tablet (20 mg total) by mouth daily at 6 PM.   busPIRone 15 MG tablet Commonly known as:  BUSPAR Take 15 mg by mouth 2 (two) times daily.   DULoxetine 60 MG capsule Commonly known as:  CYMBALTA   gabapentin 300 MG capsule Commonly known as:  NEURONTIN Take 300 mg by mouth 3 (three) times daily.   levothyroxine 100 MCG tablet Commonly known as:  SYNTHROID, LEVOTHROID Take 100 mcg by mouth daily.   metolazone 2.5 MG tablet Commonly known as:  ZAROXOLYN Metolazone 2.5 mg once weekly on Fridays. Additional dosing of metolazone with potassium supplement if her weight exceeds 262 pounds, preferably avoiding more than 3 doses of metolazone a week. What changed:  additional instructions   metoprolol succinate 50 MG 24 hr tablet Commonly known as:  TOPROL-XL Take 1 tablet (50 mg total) by mouth daily. Take with or immediately following a meal. What changed:  See the new instructions.   multivitamins ther. w/minerals Tabs tablet Take 1 tablet by mouth daily.   omeprazole 20 MG capsule Commonly known as:  PRILOSEC Take 20 mg by mouth daily.   polyethylene glycol powder powder Commonly known as:  GLYCOLAX/MIRALAX Take 17 g daily as needed by mouth.   potassium chloride SA 20 MEQ tablet Commonly known as:  K-DUR,KLOR-CON Take 1 tablet (20 mEq total) by mouth daily. What changed:    how much to take  when to take this   primidone 50 MG tablet Commonly known as:  MYSOLINE Take 50 mg by mouth 3 (three) times daily.   torsemide 20 MG tablet Commonly known as:  DEMADEX Take 2 tablets (40 mg total) by mouth 2 (two) times daily for 30 days. What changed:    how much to take  how to take  this  when to take this  additional instructions   traZODone 50 MG tablet Commonly known as:  DESYREL Take 50 mg by mouth at bedtime.      Follow-up Information    Ellsworth Lennox, PA-C Follow up on 01/27/2019.   Specialties:  Physician Assistant, Cardiology Why:  at 2:30 pm for cardiology follow up  Contact information: [address] [place] [address] [phone]        Richardean Chimera, MD. Schedule an appointment as soon as possible for a visit in 1 week(s).   Specialty:  Family Medicine Contact information: [address] [place] [address] [phone]          Allergies  Allergen Reactions  . Codeine Palpitations  . Latex   . Ace Inhibitors Cough    Consultations:  Cardiology    Procedures/Studies: Dg Chest 2 View  Result Date: 01/16/2019 CLINICAL DATA:  Chest tightness  and shortness of breath today. History of CHF. EXAM: CHEST - 2 VIEW COMPARISON:  Chest x-rays dated 01/05/2019 and 08/17/2018. FINDINGS: Stable cardiomegaly. Coarse interstitial markings bilaterally, not significantly changed compared to multiple prior studies. No evidence of alveolar pulmonary edema. No confluent opacity to suggest a developing pneumonia. No pleural effusion or pneumothorax seen. Osseous structures about the chest are unremarkable. IMPRESSION: 1. No acute findings. No evidence of pneumonia or alveolar pulmonary edema. 2. Stable cardiomegaly. 3. Coarse interstitial markings, stable compared to previous exams, most likely chronic mild interstitial edema. Electronically Signed   By: Bary Richard M.D.   On: 01/16/2019 14:22   Dg Chest 2 View  Result Date: 01/05/2019 CLINICAL DATA:  Patient with dry cough.  Chest pain. EXAM: CHEST - 2 VIEW COMPARISON:  Chest radiograph 11/02/2018 FINDINGS: Cardiomegaly. Bilateral interstitial pulmonary opacities. No pleural effusion or pneumothorax. Thoracic spine degenerative changes. IMPRESSION: Cardiomegaly.  Mild interstitial opacities may  represent mild edema. Electronically Signed   By: Annia Belt M.D.   On: 01/05/2019 16:58    Heart cath 01/18/2019  LV end diastolic pressure is mildly elevated.  Hemodynamic findings consistent with moderate pulmonary hypertension.   1. Minor nonobstructive CAD 2. Mildly elevated LV filling pressures 3. Moderate pulmonary HTN. 4. Cardiac index 2.66.      Discharge Exam: Vitals:   01/20/19 0606 01/20/19 0935  BP: (!) 106/51 137/80  Pulse: 68 70  Resp: 18   Temp: 97.6 F (36.4 C)   SpO2: 96%     General: Pt is alert, awake, not in acute distress Cardiovascular: RRR, S1/S2 +, no rubs, no gallops Respiratory: Crackles bases, no respiratory distress, on room air  Abdominal: Soft, NT, ND, bowel sounds + Extremities: no edema, no cyanosis    The results of significant diagnostics from this hospitalization (including imaging, microbiology, ancillary and laboratory) are listed below for reference.     Microbiology: Recent Results (from the past 240 hour(s))  MRSA PCR Screening     Status: None   Collection Time: 01/17/19 11:37 PM  Result Value Ref Range Status   MRSA by PCR NEGATIVE NEGATIVE Final    Comment:        The GeneXpert MRSA Assay (FDA approved for NASAL specimens only), is one component of a comprehensive MRSA colonization surveillance program. It is not intended to diagnose MRSA infection nor to guide or monitor treatment for MRSA infections. Performed at [facility], [address] [place], [place] [address]      Labs: BNP (last 3 results) Recent Labs    01/05/19 1149 01/16/19 1328  BNP 851.0* 700.0*   Basic Metabolic Panel: Recent Labs  Lab 01/16/19 1328 01/17/19 0414 01/18/19 0531 01/18/19 1030 01/18/19 1033 01/19/19 0423 01/20/19 0513  NA 136 140 137 140 139 137 136  K 4.4 4.8 4.8 4.9 4.9 5.7* 4.0  CL 105 106 104  --   --  106 105  CO2 22 25 21*  --   --  23 23  GLUCOSE 110* 96 143*  --   --  140* 119*  BUN 40* 37* 28*   --   --  25* 21  CREATININE 1.34* 1.27* 1.22*  --   --  1.26* 1.02*  CALCIUM 8.5* 8.9 9.0  --   --  9.1 8.6*  MG 2.2  --   --   --   --   --  2.0   Liver Function Tests: Recent Labs  Lab 01/16/19 1328  AST 42*  ALT 52*  ALKPHOS 172*  BILITOT 0.8  PROT 6.3*  ALBUMIN 3.5   No results for input(s): LIPASE, AMYLASE in the last 168 hours. No results for input(s): AMMONIA in the last 168 hours. CBC: Recent Labs  Lab 01/16/19 1328 01/18/19 1030 01/18/19 1033 01/19/19 0423  WBC 5.9  --   --  7.0  NEUTROABS 3.9  --   --   --   HGB 11.5* 11.2* 10.9* 12.2  HCT 39.2 33.0* 32.0* 40.6  MCV 97.3  --   --  95.8  PLT 152  --   --  154   Cardiac Enzymes: Recent Labs  Lab 01/16/19 1328 01/16/19 1707 01/16/19 2258  TROPONINI <0.03 <0.03 <0.03   BNP: Invalid input(s): POCBNP CBG: No results for input(s): GLUCAP in the last 168 hours. D-Dimer No results for input(s): DDIMER in the last 72 hours. Hgb A1c No results for input(s): HGBA1C in the last 72 hours. Lipid Profile No results for input(s): CHOL, HDL, LDLCALC, TRIG, CHOLHDL, LDLDIRECT in the last 72 hours. Thyroid function studies No results for input(s): TSH, T4TOTAL, T3FREE, THYROIDAB in the last 72 hours.  Invalid input(s): FREET3 Anemia work up No results for input(s): VITAMINB12, FOLATE, FERRITIN, TIBC, IRON, RETICCTPCT in the last 72 hours. Urinalysis    Component Value Date/Time   COLORURINE YELLOW 01/16/2019 1442   APPEARANCEUR CLEAR 01/16/2019 1442   LABSPEC 1.009 01/16/2019 1442   PHURINE 6.0 01/16/2019 1442   GLUCOSEU NEGATIVE 01/16/2019 1442   HGBUR NEGATIVE 01/16/2019 1442   BILIRUBINUR NEGATIVE 01/16/2019 1442   KETONESUR NEGATIVE 01/16/2019 1442   PROTEINUR NEGATIVE 01/16/2019 1442   UROBILINOGEN 1.0 12/19/2014 0217   NITRITE NEGATIVE 01/16/2019 1442   LEUKOCYTESUR NEGATIVE 01/16/2019 1442   Sepsis Labs Invalid input(s): PROCALCITONIN,  WBC,  LACTICIDVEN Microbiology Recent Results (from the  past 240 hour(s))  MRSA PCR Screening     Status: None   Collection Time: 01/17/19 11:37 PM  Result Value Ref Range Status   MRSA by PCR NEGATIVE NEGATIVE Final    Comment:        The GeneXpert MRSA Assay (FDA approved for NASAL specimens only), is one component of a comprehensive MRSA colonization surveillance program. It is not intended to diagnose MRSA infection nor to guide or monitor treatment for MRSA infections. Performed at [facility], [address], [place], [place] [address]      Patient was seen and examined on the day of discharge and was found to be in stable condition. Time coordinating discharge: 25 minutes including assessment and coordination of care, as well as examination of the patient.   SIGNED:  Noralee Stain, DO [facility] [email or web address] 01/20/2019, 10:11 AM

## 2019-01-26 NOTE — Progress Notes (Signed)
Cardiology Office Note    Date:  01/28/2019   ID:  Leslie Alexander, DOB 09/27/1941, MRN [id number]  PCP:  Richardean Chimera, MD  Cardiologist: Prentice Docker, MD    Chief Complaint  Patient presents with  . Hospitalization Follow-up    History of Present Illness:    Leslie Alexander is a 78 y.o. female with past medical history of persistent atrial fibrillation (on Eliquis for anticoagulation), chronic combined systolic and diastolic CHF (EF 40-98% by echo in 12/2018), chronic LBBB, mild nonobstructive CAD by cath in 2016, HTN, OSA (not compliant with CPAP), Pulmonary HTN, and Stage 3 CKD who presents to the office today for hospital follow-up.  She recently presented to [facility] ED on 01/16/2019 for evaluation of worsening dyspnea. BNP was found to be elevated to 700 and initial and cyclic troponin values remained negative. Given that she was experiencing symptoms with minimal activity and that recent echocardiogram her showed her EF was now reduced to 40 to 45%, a cardiac catheterization was recommended for definitive evaluation and she was transferred to [facility]. This was performed by Dr. Swaziland on 01/18/2019 and showed minor nonobstructive CAD but she did have mildly elevated LVEDP and moderate pulmonary hypertension. Given her continued evidence of volume overload following cardiac catheterization, she received an two additional days of IV diuresis. Weight had declined to 262 lbs at the time of discharge and IV Lasix was transitioned to Torsemide 40 mg twice daily along with taking Metolazone 2.5 mg once weekly. An outpatient sleep study was recommended and she was discharged home with close Cardiology follow-up.  In talking with the patient and her daughter today, she reports much improvement in her symptoms as compared to when she was hospitalized. Says that her dyspnea continues to improve and she is working with physical therapy intermittently. She denies any recurrent orthopnea,  PND, or lower extremity edema. No recent chest pain. She does experience intermittent palpitations secondary to her atrial fibrillation but denies any tachypalpitations or associated lightheadedness, dizziness, or presyncope.  She has been following weights on her home scales and says these have continued to trend down, currently at 257 lbs. Reports good compliance with her current medication regimen.  Past Medical History:  Diagnosis Date  . Atrial fibrillation (HCC)   . CAD (coronary artery disease)    a. mild nonobstructive CAD by cath in 11/2014. b. repeat cath in 12/2018 showing minor CAD; noted to have moderate pulmonary HTN  . Chronic combined systolic (congestive) and diastolic (congestive) heart failure (HCC)    a. EF reduced to 40-45% by echo in 12/2018  . Depression   . Essential hypertension, benign   . GERD (gastroesophageal reflux disease)   . History of breast cancer   . Hx of atrial fibrillation, no current medication    converted out of A. Fib with cardioversion  . Hypothyroidism   . Left bundle branch block    negative Lexiscan Myoview; EF 56%, 3/13   . Lumbar disc disease   . Meralgia paresthetica, right   . Mixed hyperlipidemia   . Morbid obesity (HCC)   . Nonalcoholic fatty liver disease   . OSA (obstructive sleep apnea)    CPAP machine  . Pulmonary hypertension (HCC)    RVSP 55-60 mm mercury    Past Surgical History:  Procedure Laterality Date  . CATARACT EXTRACTION W/PHACO Left 12/18/2013   Procedure: CATARACT EXTRACTION PHACO AND INTRAOCULAR LENS PLACEMENT (IOC);  Surgeon: Gemma Payor, MD;  Location: [facility];  Service: Ophthalmology;  Laterality: Left;  CDE 12.70  . CATARACT EXTRACTION, BILATERAL  04/19/2008   Dr. Alto Denver  . FEMORAL EXPLORATION  12/2003   RIGHT LATERAL FEMORAL CUTANEOUS NERVE/Dr. Channing Mutters  . HEEL SPUR SURGERY  07/2006   For spurs/Dr. Ulice Brilliant  . INCISION AND DRAINAGE OF WOUND  01/26/2012   Dr. Chaney Malling  . KNEE ARTHROSCOPY  10/2005   Left  knee/from torn cartilage Dr. Edger House  . LAPAROSCOPIC CHOLECYSTECTOMY  03/02/2011   Dr. Gabriel Cirri  . LEFT HEART CATHETERIZATION WITH CORONARY ANGIOGRAM N/A 12/20/2014   Procedure: LEFT HEART CATHETERIZATION WITH CORONARY ANGIOGRAM;  Surgeon: Peter M Swaziland, MD;  Location: [facility] CATH LAB;  Service: Cardiovascular;  Laterality: N/A;  . MASTECTOMY, RADICAL Left 1989   With chemotherapy  . PARS PLANA VITRECTOMY W/ REPAIR OF MACULAR HOLE  05/2008   Dr. Luciana Axe  . RIGHT/LEFT HEART CATH AND CORONARY ANGIOGRAPHY N/A 01/18/2019   Procedure: RIGHT/LEFT HEART CATH AND CORONARY ANGIOGRAPHY;  Surgeon: Swaziland, Peter M, MD;  Location: [facility] INVASIVE CV LAB;  Service: Cardiovascular;  Laterality: N/A;  . TOTAL KNEE ARTHROPLASTY Left 12/29/2011   Dr. Chaney Malling  . TUBAL LIGATION     BILATERAL    Current Medications: Outpatient Medications Prior to Visit  Medication Sig Dispense Refill  . acetaminophen (TYLENOL) 325 MG tablet Take 650 mg by mouth 3 (three) times daily.    Marland Kitchen allopurinol (ZYLOPRIM) 300 MG tablet Take 300 mg daily by mouth.  6  . apixaban (ELIQUIS) 5 MG TABS tablet Take 1 tablet (5 mg total) by mouth 2 (two) times daily. 180 tablet 3  . atorvastatin (LIPITOR) 20 MG tablet Take 1 tablet (20 mg total) by mouth daily at 6 PM. 30 tablet 0  . busPIRone (BUSPAR) 15 MG tablet Take 15 mg by mouth 2 (two) times daily.  6  . DULoxetine (CYMBALTA) 60 MG capsule     . gabapentin (NEURONTIN) 300 MG capsule Take 300 mg by mouth 3 (three) times daily.    Marland Kitchen levothyroxine (SYNTHROID, LEVOTHROID) 100 MCG tablet Take 100 mcg by mouth daily.    . metolazone (ZAROXOLYN) 2.5 MG tablet Metolazone 2.5 mg once weekly on Fridays. Additional dosing of metolazone with potassium supplement if her weight exceeds 262 pounds, preferably avoiding more than 3 doses of metolazone a week. 12 tablet 0  . metoprolol succinate (TOPROL-XL) 50 MG 24 hr tablet Take 1 tablet (50 mg total) by mouth daily. Take with or immediately following a  meal. 30 tablet 0  . Multiple Vitamins-Minerals (MULTIVITAMINS THER. W/MINERALS) TABS tablet Take 1 tablet by mouth daily.    Marland Kitchen omeprazole (PRILOSEC) 20 MG capsule Take 20 mg by mouth daily.    . polyethylene glycol powder (GLYCOLAX/MIRALAX) powder Take 17 g daily as needed by mouth.  6  . potassium chloride SA (K-DUR,KLOR-CON) 20 MEQ tablet Take 1 tablet (20 mEq total) by mouth daily. 30 tablet 0  . primidone (MYSOLINE) 50 MG tablet Take 50 mg by mouth 3 (three) times daily.     Marland Kitchen torsemide (DEMADEX) 20 MG tablet Take 2 tablets (40 mg total) by mouth 2 (two) times daily for 30 days. 120 tablet 0  . traZODone (DESYREL) 50 MG tablet Take 50 mg by mouth at bedtime.      No facility-administered medications prior to visit.      Allergies:   Codeine; Latex; and Ace inhibitors   Social History   Socioeconomic History  . Marital status: Widowed    Spouse name: Not  on file  . Number of children: 1  . Years of education: 50  . Highest education level: High school graduate  Occupational History  . Occupation: retired Engineer, petroleum  Social Needs  . Financial resource strain: Not on file  . Food insecurity:    Worry: Not on file    Inability: Not on file  . Transportation needs:    Medical: Not on file    Non-medical: Not on file  Tobacco Use  . Smoking status: Former Smoker    Packs/day: 0.50    Years: 10.00    Pack years: 5.00    Types: Cigarettes    Start date: 06/03/1965    Last attempt to quit: 11/24/1987    Years since quitting: 31.2  . Smokeless tobacco: Never Used  Substance and Sexual Activity  . Alcohol use: No    Alcohol/week: 0.0 standard drinks  . Drug use: No  . Sexual activity: Never  Lifestyle  . Physical activity:    Days per week: Not on file    Minutes per session: Not on file  . Stress: Not on file  Relationships  . Social connections:    Talks on phone: Not on file    Gets together: Not on file    Attends religious service: Not on file    Active  member of club or organization: Not on file    Attends meetings of clubs or organizations: Not on file    Relationship status: Not on file  Other Topics Concern  . Not on file  Social History Narrative   Has 1 daughter that lives with her in a one story home.  Retired Armed forces training and education officer.  Education: high school.      Family History:  The patient's family history includes Breast cancer in her sister; COPD in her mother; Other in her father.   Review of Systems:   Please see the history of present illness.     General:  No chills, fever, night sweats or weight changes.  Cardiovascular:  No chest pain, edema, orthopnea, paroxysmal nocturnal dyspnea. Positive for palpitations and dyspnea on exertion.  Dermatological: No rash, lesions/masses Respiratory: No cough, dyspnea Urologic: No hematuria, dysuria Abdominal:   No nausea, vomiting, diarrhea, bright red blood per rectum, melena, or hematemesis Neurologic:  No visual changes, wkns, changes in mental status. All other systems reviewed and are otherwise negative except as noted above.   Physical Exam:    VS:  BP 108/70   Pulse 71   Ht 5' 5.5" (1.664 m)   Wt 260 lb (117.9 kg)   SpO2 93%   BMI 42.61 kg/m    General: Well developed, obese Caucasian female appearing in no acute distress. Head: Normocephalic, atraumatic, sclera non-icteric, no xanthomas, nares are without discharge.  Neck: No carotid bruits. JVD not elevated.  Lungs: Respirations regular and unlabored, without wheezes or rales.  Heart: Irregularly irregular. No S3 or S4.  No murmur, no rubs, or gallops appreciated. Abdomen: Soft, non-tender, non-distended with normoactive bowel sounds. No hepatomegaly. No rebound/guarding. No obvious abdominal masses. Msk:  Strength and tone appear normal for age. No joint deformities or effusions. Extremities: No clubbing or cyanosis. Trace ankle edema bilaterally.  Distal pedal pulses are 2+ bilaterally. Neuro: Alert and  oriented X 3. Moves all extremities spontaneously. No focal deficits noted. Psych:  Responds to questions appropriately with a normal affect. Skin: No rashes or lesions noted  Wt Readings from Last 3 Encounters:  01/27/19 260 lb (  117.9 kg)  01/20/19 263 lb 4.8 oz (119.4 kg)  01/05/19 269 lb 3.2 oz (122.1 kg)     Studies/Labs Reviewed:   EKG:  EKG is not ordered today.   Recent Labs: 01/16/2019: ALT 52; B Natriuretic Peptide 700.0; TSH 1.768 01/19/2019: Hemoglobin 12.2; Platelets 154 01/20/2019: Magnesium 2.0 01/27/2019: BUN 32; Creatinine, Ser 1.24; Potassium 4.4; Sodium 136   Lipid Panel    Component Value Date/Time   CHOL 190 12/19/2014 0645   TRIG 74 12/19/2014 0645   HDL 40 12/19/2014 0645   CHOLHDL 4.8 12/19/2014 0645   VLDL 15 12/19/2014 0645   LDLCALC 135 (H) 12/19/2014 0645    Additional studies/ records that were reviewed today include:   Echocardiogram: 01/12/2019 IMPRESSIONS  1. The cavity size was mildly dilated. There is mildly increased left ventricular wall thickness. Left ventricular ejection fraction of 40-45% with septal motion suggestive of LBBB. Left ventricular diastolic Doppler parameters are indeterminate. There  is right ventricular volume overload.  2. The right ventricle has mildly reduced systolic function. The cavity was moderately enlarged. There is no increase in right ventricular wall thickness. Right ventricular systolic pressure is moderately elevated with an estimated pressure of 54.4  mmHg.  3. The aortic valve is tricuspid Aortic valve regurgitation is trivial by color flow Doppler.. Mild aortic annular calcification noted.  4. The mitral valve is normal in structure. There is mild mitral annular calcification present. Mitral valve regurgitation is moderate by color flow Doppler.  5. The tricuspid valve is normal in structure. Tricuspid valve regurgitation is moderate-severe.  6. The aortic root is normal in size and structure.  7. Right  atrial size was moderately dilated.  8. Left atrial size was mildly dilated.  9. The inferior vena cava was dilated in size with <50% respiratory variability.  Cardiac Catheterization: 01/18/2019  LV end diastolic pressure is mildly elevated.  Hemodynamic findings consistent with moderate pulmonary hypertension.   1. Minor nonobstructive CAD 2. Mildly elevated LV filling pressures 3. Moderate pulmonary HTN. 4. Cardiac index 2.66.   Plan: medical management. May resume Eliquis in am.   Assessment:    1. Chronic combined systolic (congestive) and diastolic (congestive) heart failure (HCC)   2. Persistent atrial fibrillation   3. Essential hypertension   4. OSA (obstructive sleep apnea)   5. CKD (chronic kidney disease) stage 3, GFR 30-59 ml/min (HCC)   6. Medication management      Plan:   In order of problems listed above:  1. Chronic Combined Systolic and Diastolic CHF - EF at 40-45% by echo in 12/2018 with catheterization showing minor nonobstructive CAD. Her volume status has continued to improve since discharge and weight continues to decline.  - still experiencing dyspnea on exertion but much improved. Denies any orthopnea or PND.  - she is currently taking Torsemide 40 mg twice daily along with taking Metolazone 2.5 mg once weekly. Will recheck BMET today. If renal function remains stable, would continue her current regimen. Remains on Toprol-XL 50mg  daily. Unable to add ACE-I or ARB at this time given low-normal BP. I encouraged her to follow BP at home and if this allows at the time of her next visit, would add Losartan 12.5mg  daily.   2. Persistent Atrial Fibrillation - she denies any recent palpitations and rate is well-controlled in the 70's during today's visit. Continue Toprol-XL 50mg  daily for rate-control.  - she denies any evidence of active bleeding. Continue Eliquis 5mg  BID for anticoagulation.   3. HTN -  BP is well-controlled at 108/70 during today's  visit. Continue Toprol-XL 50mg  daily.   4. OSA - has not used her CPAP in 5+ years. Scheduled for a repeat sleep study in 02/2019. Compliance with CPAP encouraged, especially given documented moderate pulmonary HTN by recent cath.   5. CKD - baseline creatinine 1.2 - 1.3 Recheck BMET today.    Medication Adjustments/Labs and Tests Ordered: Current medicines are reviewed at length with the patient today.  Concerns regarding medicines are outlined above.  Medication changes, Labs and Tests ordered today are listed in the Patient Instructions below. Patient Instructions  Medication Instructions:  Your physician recommends that you continue on your current medications as directed. Please refer to the Current Medication list given to you today.  If you need a refill on your cardiac medications before your next appointment, please call your pharmacy.   Lab work: Your physician recommends that you return for lab work in: Today   If you have labs (blood work) drawn today and your tests are completely normal, you will receive your results only by: Marland Kitchen MyChart Message (if you have MyChart) OR . A paper copy in the mail If you have any lab test that is abnormal or we need to change your treatment, we will call you to review the results.  Testing/Procedures: NONE   Follow-Up: At [facility], you and your health needs are our priority.  As part of our continuing mission to provide you with exceptional heart care, we have created designated Provider Care Teams.  These Care Teams include your primary Cardiologist (physician) and Advanced Practice Providers (APPs -  Physician Assistants and Nurse Practitioners) who all work together to provide you with the care you need, when you need it. You will need a follow up appointment in 6-8 weeks.  Please call our office 2 months in advance to schedule this appointment.  You may see Prentice Docker, MD or one of the following Advanced Practice Providers on  your designated Care Team:   Randall An, PA-C [facility]) . Jacolyn Reedy, PA-C [facility] Office)  Any Other Special Instructions Will Be Listed Below (If Applicable). Thank you for choosing [facility]!     Signed, Ellsworth Lennox, PA-C  01/28/2019 1:07 PM    [facility] [address] [place], [place] [address] Phone: [phone] Fax: [phone]

## 2019-01-27 ENCOUNTER — Ambulatory Visit: Payer: Medicare Other | Admitting: Student

## 2019-01-27 ENCOUNTER — Other Ambulatory Visit (HOSPITAL_COMMUNITY)
Admission: RE | Admit: 2019-01-27 | Discharge: 2019-01-27 | Disposition: A | Discharge status: Home / Self Care | Payer: Medicare Other | Source: Ambulatory Visit | Attending: Otolaryngology | Admitting: Otolaryngology

## 2019-01-27 VITALS — BP 108/70 | HR 71 | Ht 65.5 in | Wt 260.0 lb

## 2019-01-27 DIAGNOSIS — Z79899 Other long term (current) drug therapy: Secondary | ICD-10-CM | POA: Insufficient documentation

## 2019-01-27 DIAGNOSIS — I5042 Chronic combined systolic (congestive) and diastolic (congestive) heart failure: Secondary | ICD-10-CM

## 2019-01-27 DIAGNOSIS — G4733 Obstructive sleep apnea (adult) (pediatric): Secondary | ICD-10-CM

## 2019-01-27 DIAGNOSIS — I4819 Other persistent atrial fibrillation: Secondary | ICD-10-CM

## 2019-01-27 DIAGNOSIS — I10 Essential (primary) hypertension: Secondary | ICD-10-CM | POA: Diagnosis not present

## 2019-01-27 DIAGNOSIS — N183 Chronic kidney disease, stage 3 unspecified: Secondary | ICD-10-CM

## 2019-01-27 LAB — BASIC METABOLIC PANEL
Anion gap: 10 (ref 5–15)
BUN: 32 mg/dL — ABNORMAL HIGH (ref 8–23)
CO2: 24 mmol/L (ref 22–32)
CREATININE: 1.24 mg/dL — AB (ref 0.44–1.00)
Calcium: 9.1 mg/dL (ref 8.9–10.3)
Chloride: 102 mmol/L (ref 98–111)
GFR calc Af Amer: 49 mL/min — ABNORMAL LOW (ref >60)
GFR calc non Af Amer: 42 mL/min — ABNORMAL LOW (ref >60)
Glucose, Bld: 139 mg/dL — ABNORMAL HIGH (ref 70–99)
Potassium: 4.4 mmol/L (ref 3.5–5.1)
SODIUM: 136 mmol/L (ref 135–145)

## 2019-01-27 NOTE — Patient Instructions (Signed)
Medication Instructions:  Your physician recommends that you continue on your current medications as directed. Please refer to the Current Medication list given to you today.  If you need a refill on your cardiac medications before your next appointment, please call your pharmacy.   Lab work: Your physician recommends that you return for lab work in: Today   If you have labs (blood work) drawn today and your tests are completely normal, you will receive your results only by: Marland Kitchen MyChart Message (if you have MyChart) OR . A paper copy in the mail If you have any lab test that is abnormal or we need to change your treatment, we will call you to review the results.  Testing/Procedures: NONE   Follow-Up: At [facility], you and your health needs are our priority.  As part of our continuing mission to provide you with exceptional heart care, we have created designated Provider Care Teams.  These Care Teams include your primary Cardiologist (physician) and Advanced Practice Providers (APPs -  Physician Assistants and Nurse Practitioners) who all work together to provide you with the care you need, when you need it. You will need a follow up appointment in 6-8 weeks.  Please call our office 2 months in advance to schedule this appointment.  You may see Prentice Docker, MD or one of the following Advanced Practice Providers on your designated Care Team:   Randall An, PA-C [facility]) . Jacolyn Reedy, PA-C [facility] Office)  Any Other Special Instructions Will Be Listed Below (If Applicable). Thank you for choosing [facility]!

## 2019-01-28 ENCOUNTER — Encounter: Payer: Self-pay | Admitting: Student

## 2019-01-30 ENCOUNTER — Telehealth: Payer: Self-pay | Admitting: *Deleted

## 2019-01-30 DIAGNOSIS — Z79899 Other long term (current) drug therapy: Secondary | ICD-10-CM

## 2019-01-30 NOTE — Telephone Encounter (Signed)
-----   Message from Ellsworth Lennox, [place] sent at 01/28/2019  1:11 PM EST ----- Please let the patient know her kidney function remains stable when compared to values from recent hospitalization (baseline creatinine 1.2 - 1.3, at 1.24 today). Electrolytes stable. Continue current medication regimen for now. Would recommend repeat BMET in 4 weeks for reassessment. Please forward a copy of labs to Richardean Chimera, MD. Thank you.

## 2019-01-30 NOTE — Telephone Encounter (Signed)
Labs order placed.

## 2019-03-10 ENCOUNTER — Encounter (HOSPITAL_BASED_OUTPATIENT_CLINIC_OR_DEPARTMENT_OTHER): Payer: Medicare Other

## 2019-03-23 ENCOUNTER — Telehealth: Payer: Self-pay | Admitting: *Deleted

## 2019-03-23 NOTE — Telephone Encounter (Signed)
Patient verbally consented for tele-health visits with [facility] and understands that her insurance company will be billed for the encounter.  Aware to have vitals available   

## 2019-03-28 ENCOUNTER — Telehealth: Payer: Self-pay | Admitting: Neurology

## 2019-03-28 ENCOUNTER — Encounter: Payer: Self-pay | Admitting: Cardiovascular Disease

## 2019-03-28 ENCOUNTER — Telehealth (INDEPENDENT_AMBULATORY_CARE_PROVIDER_SITE_OTHER): Payer: Medicare Other | Admitting: Cardiovascular Disease

## 2019-03-28 VITALS — BP 130/91 | HR 76 | Ht 65.0 in | Wt 246.0 lb

## 2019-03-28 DIAGNOSIS — I447 Left bundle-branch block, unspecified: Secondary | ICD-10-CM

## 2019-03-28 DIAGNOSIS — I4819 Other persistent atrial fibrillation: Secondary | ICD-10-CM

## 2019-03-28 DIAGNOSIS — Z79899 Other long term (current) drug therapy: Secondary | ICD-10-CM

## 2019-03-28 DIAGNOSIS — N183 Chronic kidney disease, stage 3 unspecified: Secondary | ICD-10-CM

## 2019-03-28 DIAGNOSIS — I10 Essential (primary) hypertension: Secondary | ICD-10-CM

## 2019-03-28 DIAGNOSIS — I2510 Atherosclerotic heart disease of native coronary artery without angina pectoris: Secondary | ICD-10-CM

## 2019-03-28 DIAGNOSIS — I5042 Chronic combined systolic (congestive) and diastolic (congestive) heart failure: Secondary | ICD-10-CM | POA: Diagnosis not present

## 2019-03-28 DIAGNOSIS — I2583 Coronary atherosclerosis due to lipid rich plaque: Secondary | ICD-10-CM

## 2019-03-28 DIAGNOSIS — G250 Essential tremor: Secondary | ICD-10-CM

## 2019-03-28 DIAGNOSIS — G4733 Obstructive sleep apnea (adult) (pediatric): Secondary | ICD-10-CM

## 2019-03-28 MED ORDER — LOSARTAN POTASSIUM 25 MG PO TABS: 12.5000 mg | ORAL_TABLET | Freq: Every day | ORAL | 3 refills | Status: DC

## 2019-03-28 NOTE — Progress Notes (Signed)
Virtual Visit via Video Note   This visit type was conducted due to national recommendations for restrictions regarding the COVID-19 Pandemic (e.g. social distancing) in an effort to limit this patient's exposure and mitigate transmission in our community.  Due to her co-morbid illnesses, this patient is at least at moderate risk for complications without adequate follow up.  This format is felt to be most appropriate for this patient at this time.  All issues noted in this document were discussed and addressed.  A limited physical exam was performed with this format.  Please refer to the patient's chart for her consent to telehealth for [facility].   Date:  03/28/2019   ID:  Leslie Alexander, DOB 29-Oct-1941, MRN [id number]  Patient Location: Home Provider Location: Home  PCP:  Richardean Chimera, MD  Cardiologist:  Prentice Docker, MD  Electrophysiologist:  None   Evaluation Performed:  Follow-Up Visit  Chief Complaint:  CHF, atrial fibrillation  History of Present Illness:    Leslie Alexander is a 78 y.o. female with a past medical history of persistent atrial fibrillation (on Eliquis for anticoagulation), chronic combined systolic and diastolic CHF (EF 40-98% by echo in 12/2018), chronic LBBB, mild nonobstructive CAD by cath in February 2020, HTN, morbid obesity, OSA(not compliant with CPAP), Pulmonary HTN, and Stage 3 CKD.   She was hospitalized in late February and followed up in our office on 01/27/19 with B. Strader PA-C.  Her daughter Toniann Fail was audible throughout this telehealth visit.  The patient denies shortness of breath, chest pain, orthopnea, and leg swelling.  Her primary complaints relate to worsening tremors. She saw neurology in October 2019 and was diagnosed with essential tremor.  She had been on Buspar which has been stopped and PCP recently increased primidone to 100 mg tid without decrease in symptoms. This has affected her quality of life.  The patient  does not have symptoms concerning for COVID-19 infection (fever, chills, cough, or new shortness of breath).    Past Medical History:  Diagnosis Date  . Atrial fibrillation (HCC)   . CAD (coronary artery disease)    a. mild nonobstructive CAD by cath in 11/2014. b. repeat cath in 12/2018 showing minor CAD; noted to have moderate pulmonary HTN  . Chronic combined systolic (congestive) and diastolic (congestive) heart failure (HCC)    a. EF reduced to 40-45% by echo in 12/2018  . Depression   . Essential hypertension, benign   . GERD (gastroesophageal reflux disease)   . History of breast cancer   . Hx of atrial fibrillation, no current medication    converted out of A. Fib with cardioversion  . Hypothyroidism   . Left bundle branch block    negative Lexiscan Myoview; EF 56%, 3/13   . Lumbar disc disease   . Meralgia paresthetica, right   . Mixed hyperlipidemia   . Morbid obesity (HCC)   . Nonalcoholic fatty liver disease   . OSA (obstructive sleep apnea)    CPAP machine  . Pulmonary hypertension (HCC)    RVSP 55-60 mm mercury   Past Surgical History:  Procedure Laterality Date  . CATARACT EXTRACTION W/PHACO Left 12/18/2013   Procedure: CATARACT EXTRACTION PHACO AND INTRAOCULAR LENS PLACEMENT (IOC);  Surgeon: Gemma Payor, MD;  Location: [facility];  Service: Ophthalmology;  Laterality: Left;  CDE 12.70  . CATARACT EXTRACTION, BILATERAL  04/19/2008   Dr. Alto Denver  . FEMORAL EXPLORATION  12/2003   RIGHT LATERAL FEMORAL CUTANEOUS NERVE/Dr. Channing Mutters  .  HEEL SPUR SURGERY  07/2006   For spurs/Dr. Ulice Brilliant  . INCISION AND DRAINAGE OF WOUND  01/26/2012   Dr. Chaney Malling  . KNEE ARTHROSCOPY  10/2005   Left knee/from torn cartilage Dr. Edger House  . LAPAROSCOPIC CHOLECYSTECTOMY  03/02/2011   Dr. Gabriel Cirri  . LEFT HEART CATHETERIZATION WITH CORONARY ANGIOGRAM N/A 12/20/2014   Procedure: LEFT HEART CATHETERIZATION WITH CORONARY ANGIOGRAM;  Surgeon: Peter M Swaziland, MD;  Location: [facility] CATH LAB;  Service:  Cardiovascular;  Laterality: N/A;  . MASTECTOMY, RADICAL Left 1989   With chemotherapy  . PARS PLANA VITRECTOMY W/ REPAIR OF MACULAR HOLE  05/2008   Dr. Luciana Axe  . RIGHT/LEFT HEART CATH AND CORONARY ANGIOGRAPHY N/A 01/18/2019   Procedure: RIGHT/LEFT HEART CATH AND CORONARY ANGIOGRAPHY;  Surgeon: Swaziland, Peter M, MD;  Location: [facility] INVASIVE CV LAB;  Service: Cardiovascular;  Laterality: N/A;  . TOTAL KNEE ARTHROPLASTY Left 12/29/2011   Dr. Chaney Malling  . TUBAL LIGATION     BILATERAL     Current Meds  Medication Sig  . acetaminophen (TYLENOL) 325 MG tablet Take 650 mg by mouth 3 (three) times daily.  Marland Kitchen allopurinol (ZYLOPRIM) 300 MG tablet Take 300 mg daily by mouth.  Marland Kitchen apixaban (ELIQUIS) 5 MG TABS tablet Take 1 tablet (5 mg total) by mouth 2 (two) times daily.  Marland Kitchen atorvastatin (LIPITOR) 20 MG tablet Take 1 tablet (20 mg total) by mouth daily at 6 PM.  . DULoxetine (CYMBALTA) 60 MG capsule Take 60 mg by mouth daily.   . fexofenadine (ALLEGRA) 180 MG tablet Take 180 mg by mouth daily.  Marland Kitchen gabapentin (NEURONTIN) 300 MG capsule Take 300 mg by mouth 3 (three) times daily.  Marland Kitchen levothyroxine (SYNTHROID, LEVOTHROID) 100 MCG tablet Take 100 mcg by mouth daily.  . metolazone (ZAROXOLYN) 2.5 MG tablet Metolazone 2.5 mg once weekly on Fridays. Additional dosing of metolazone with potassium supplement if her weight exceeds 262 pounds, preferably avoiding more than 3 doses of metolazone a week.  . metoprolol succinate (TOPROL-XL) 50 MG 24 hr tablet Take 1 tablet (50 mg total) by mouth daily. Take with or immediately following a meal.  . Multiple Vitamins-Minerals (MULTIVITAMINS THER. W/MINERALS) TABS tablet Take 1 tablet by mouth daily.  . multivitamin-lutein (OCUVITE-LUTEIN) CAPS capsule Take 1 capsule by mouth daily.  Marland Kitchen omeprazole (PRILOSEC) 20 MG capsule Take 20 mg by mouth daily.  . polyethylene glycol powder (GLYCOLAX/MIRALAX) powder Take 17 g daily as needed by mouth.  . potassium chloride SA  (K-DUR,KLOR-CON) 20 MEQ tablet Take 1 tablet (20 mEq total) by mouth daily.  . primidone (MYSOLINE) 50 MG tablet Take 100 mg by mouth 3 (three) times daily.   Marland Kitchen torsemide (DEMADEX) 20 MG tablet Take 2 tablets (40 mg total) by mouth 2 (two) times daily for 30 days.  . traZODone (DESYREL) 50 MG tablet Take 50 mg by mouth at bedtime.      Allergies:   Codeine; Latex; and Ace inhibitors   Social History   Tobacco Use  . Smoking status: Former Smoker    Packs/day: 0.50    Years: 10.00    Pack years: 5.00    Types: Cigarettes    Start date: 06/03/1965    Last attempt to quit: 11/24/1987    Years since quitting: 31.3  . Smokeless tobacco: Never Used  Substance Use Topics  . Alcohol use: No    Alcohol/week: 0.0 standard drinks  . Drug use: No     Family Hx: The patient's family history includes Breast cancer  in her sister; COPD in her mother; Other in her father.  ROS:   Please see the history of present illness.     All other systems reviewed and are negative.   Prior CV studies:   The following studies were reviewed today:  Echocardiogram: 01/12/2019 IMPRESSIONS 1. The cavity size was mildly dilated. There is mildly increased left ventricular wall thickness. Left ventricular ejection fraction of 40-45% with septal motion suggestive of LBBB. Left ventricular diastolic Doppler parameters are indeterminate. There  is right ventricular volume overload. 2. The right ventricle has mildly reduced systolic function. The cavity was moderately enlarged. There is no increase in right ventricular wall thickness. Right ventricular systolic pressure is moderately elevated with an estimated pressure of 54.4  mmHg. 3. The aortic valve is tricuspid Aortic valve regurgitation is trivial by color flow Doppler.. Mild aortic annular calcification noted. 4. The mitral valve is normal in structure. There is mild mitral annular calcification present. Mitral valve regurgitation is moderate by color  flow Doppler. 5. The tricuspid valve is normal in structure. Tricuspid valve regurgitation is moderate-severe. 6. The aortic root is normal in size and structure. 7. Right atrial size was moderately dilated. 8. Left atrial size was mildly dilated. 9. The inferior vena cava was dilated in size with <50% respiratory variability.  Cardiac Catheterization: 01/18/2019  LV end diastolic pressure is mildly elevated.  Hemodynamic findings consistent with moderate pulmonary hypertension.  1. Minor nonobstructive CAD 2. Mildly elevated LV filling pressures 3. Moderate pulmonary HTN. 4. Cardiac index 2.66.   Plan: medical management. May resume Eliquis in am.   Labs/Other Tests and Data Reviewed:    EKG:  No ECG reviewed.  Recent Labs: 01/16/2019: ALT 52; B Natriuretic Peptide 700.0; TSH 1.768 01/19/2019: Hemoglobin 12.2; Platelets 154 01/20/2019: Magnesium 2.0 01/27/2019: BUN 32; Creatinine, Ser 1.24; Potassium 4.4; Sodium 136   Recent Lipid Panel Lab Results  Component Value Date/Time   CHOL 190 12/19/2014 06:45 AM   TRIG 74 12/19/2014 06:45 AM   HDL 40 12/19/2014 06:45 AM   CHOLHDL 4.8 12/19/2014 06:45 AM   LDLCALC 135 (H) 12/19/2014 06:45 AM    Wt Readings from Last 3 Encounters:  03/28/19 246 lb (111.6 kg)  01/27/19 260 lb (117.9 kg)  01/20/19 263 lb 4.8 oz (119.4 kg)     Objective:    Vital Signs:  BP (!) 130/91   Pulse 76   Ht  (1.651 m)   Wt 246 lb (111.6 kg)   BMI 40.94 kg/m    VITAL SIGNS:  reviewed GEN:  no acute distress EYES:  sclerae anicteric, EOMI - Extraocular Movements Intact RESPIRATORY:  normal respiratory effort, symmetric expansion MUSCULOSKELETAL:  no obvious deformities. NEURO:  alert and oriented x 3, no obvious focal deficit PSYCH:  normal affect  ASSESSMENT & PLAN:    1. Chronic combined CHF/NICM: Euvolemic on torsemide 40 mg bid and metolazone 2.5 mg once per week. Remains on Toprol-XL 50 mg daily. DBP is mildly elevated. I  will add losartan 12.5 mg. I will check a BMET in 2 weeks. SCr 1.24 on 01/27/19. She weighs daily.  2. Persistent atrial fibrillation: HR controlled on Toprol-XL 50 mg daily. Anticoagulated with Eliquis 5 mg bid.  3. HTN: DBP mildly elevated. I will start losartan 12.5 mg daily.  4. OSA: Is awaiting sleep study. Has not used CPAP in 5+ years. This in conjunction with morbid obesity has led to right heart failure/pulmonary hypertension.  5. Morbid obesity: Needs significant weight  loss.  6. CKD:  I will check a BMET in 2 weeks. SCr 1.24 on 01/27/19.  7. LBBB: Chronic.  8. CAD: Mild, nonobstructive disease. On atorvastatin.  9. Essential Tremor: On primidone 100 mg tid. Encouraged to speak with neurology. I also recommended evaluation and treatment of OSA as this could only potentially worsen underlying neurologic issues.    COVID-19 Education: The signs and symptoms of COVID-19 were discussed with the patient and how to seek care for testing (follow up with PCP or arrange E-visit).  The importance of social distancing was discussed today.  Time:   Today, I have spent 40 minutes with the patient with telehealth technology discussing the above problems.     Medication Adjustments/Labs and Tests Ordered: Current medicines are reviewed at length with the patient today.  Concerns regarding medicines are outlined above.   Tests Ordered: No orders of the defined types were placed in this encounter.   Medication Changes: No orders of the defined types were placed in this encounter.   Disposition:  Follow up in 3 month(s)  Signed, Prentice Docker, MD  03/28/2019 2:24 PM    [facility]

## 2019-03-28 NOTE — Addendum Note (Signed)
Addended by: Eustace Moore on: 03/28/2019 03:03 PM   Modules accepted: Orders

## 2019-03-28 NOTE — Telephone Encounter (Signed)
Set her up for evisit and we can talk.

## 2019-03-28 NOTE — Telephone Encounter (Signed)
Cardiologist told her today during VV that she needs to see Dr. Arbutus Leas and they may want to do different testing on her for her Tremors. They have gotten worse since Oct. When she last saw Dr. Arbutus Leas. Please let me know if I can schedule her as E-visit. Thanks!

## 2019-03-28 NOTE — Patient Instructions (Addendum)
Medication Instructions:   Your physician has recommended you make the following change in your medication:   Start losartan 12.5 mg by mouth daily  Continue all other medications the same  Labwork:  Your physician recommends that you return for lab work in: 2 weeks to check your BMET. Please have this done at [facility] or [facility] in [place].-Lab order mailed.  Testing/Procedures:  NONE  Follow-Up:  Your physician recommends that you schedule a follow-up appointment in: 3 months.  Any Other Special Instructions Will Be Listed Below (If Applicable).  If you need a refill on your cardiac medications before your next appointment, please call your pharmacy.

## 2019-04-04 NOTE — Progress Notes (Signed)
Virtual Visit via Video Note The purpose of this virtual visit is to provide medical care while limiting exposure to the novel coronavirus.    Consent was obtained for video visit:  Yes.   Answered questions that patient had about telehealth interaction:  Yes.   I discussed the limitations, risks, security and privacy concerns of performing an evaluation and management service by telemedicine. I also discussed with the patient that there may be a patient responsible charge related to this service. The patient expressed understanding and agreed to proceed.  Pt location: Home Physician Location: office Name of referring provider:  Richardean Chimera, MD I connected with Leslie Alexander at patients initiation/request on 04/07/2019 at 10:00 AM EDT by video enabled telemedicine application and verified that I am speaking with the correct person using two identifiers. Pt MRN:  [id number] Pt DOB:  07-23-1941 Video Participants:  Leslie Alexander;  daughter   History of Present Illness:  Patient is seen today in follow-up for essential tremor.  Her cardiologist told her to follow-up with me.  When I last saw her in October, she was not doing as well because she was on Eliquis and could not take the primidone with the Eliquis because of the interaction.  She returns today and primidone is still on her medication list at 100 mg tid and she is still on eliquis.  Patients daughter states that the primidone was actually just doubled and she is still having significant tremor.  Her primary care is the one prescribing the medication.  Cardiology also recommended that  treat and evaluate her obstructive sleep apnea, as "this could only potentially worsen underlying neurologic issues."  He does note that she is currently awaiting a sleep study.   Observations/Objective:   There were no vitals filed for this visit. GEN:  The patient appears stated age and is in NAD.  Neurological examination:  Orientation:  The patient is alert and oriented x3. Cranial nerves: There is good facial symmetry. There is nofacial hypomimia.  The speech is fluent and clear. Soft palate rises symmetrically and there is no tongue deviation. Hearing is intact to conversational tone. Motor: Strength is at least antigravity x 4.   Shoulder shrug is equal and symmetric.  There is no pronator drift.  Movement examination: Tone: unable Abnormal movements: Patient has mild tremor of the outstretched hands.  When she is given a medium weight water bottle, she continues to have mild tremor on the left.  The right is a little bit better.     Assessment and Plan:   1.  Essential tremor  -Patient and I had a long discussion today.  At our last visit, in October, we discussed the fact that one cannot be on primidone with Eliquis because of the interaction with these medications.  Her primary care physician has restarted the primidone, but I discussed with her that this is just not safe with the Eliquis and decreases the efficacy of the Eliquis.  It is my recommendation that we wean the primidone and the patient/daughter was agreeable.  I gave him a weaning schedule.  She will go from her current primidone, 2 tablets 3 times per day to primidone, 2 tablets twice per day for a week and then 2 tablets in the morning and 1 tablet in the evening for a week and then 1 tablet twice per day for a week and then 1 tablet in the morning for a week and then she will stop the  medication.  -Discussed with the patient that unless she is able to changed to Pradaxa that we likely will not be able to put her back on primidone.  I believe that we asked her cardiologist back in October if this was appropriate and he did not feel that was an appropriate change.  They are going to discuss this again with the cardiologist.  -Discussed with the patient that I really think that the second line medications are not going to be strongly impactful for her.  She is  already on gabapentin (a second line medication).  She is already on metoprolol (treatment for tremor).  In addition, other second line medications are strongly anticholinergic and I think it would cause confusion (daughter already reports some) and increased risk for falls.  -Discussed in detail weighted gloves/weighted forks and spoons that can help manage tremor without the need for medications.  2.  Obstructive sleep apnea  -Discussed morbidity and mortality associated with untreated sleep apnea.  She has not worn her CPAP for over 5 years.  Per cardiology records, she is awaiting a sleep study.  Follow Up Instructions:  Follow-up as needed  -I discussed the assessment and treatment plan with the patient. The patient was provided an opportunity to ask questions and all were answered. The patient agreed with the plan and demonstrated an understanding of the instructions.   The patient was advised to call back or seek an in-person evaluation if the symptoms worsen or if the condition fails to improve as anticipated.    Total Time spent in visit with the patient was:  20 minutes.   Pt understands and agrees with the plan of care outlined.     Kerin Salen, DO

## 2019-04-07 ENCOUNTER — Telehealth (INDEPENDENT_AMBULATORY_CARE_PROVIDER_SITE_OTHER): Payer: Medicare Other | Admitting: Neurology

## 2019-04-07 ENCOUNTER — Other Ambulatory Visit: Payer: Self-pay

## 2019-04-07 ENCOUNTER — Encounter: Payer: Self-pay | Admitting: Neurology

## 2019-04-07 DIAGNOSIS — G250 Essential tremor: Secondary | ICD-10-CM

## 2019-04-07 NOTE — Progress Notes (Signed)
I think it would be fine to switch to Pradaxa.

## 2019-04-19 ENCOUNTER — Encounter (HOSPITAL_BASED_OUTPATIENT_CLINIC_OR_DEPARTMENT_OTHER): Payer: Medicare Other

## 2019-04-21 ENCOUNTER — Inpatient Hospital Stay (HOSPITAL_COMMUNITY): Admission: RE | Admit: 2019-04-21 | Payer: Medicare Other | Source: Ambulatory Visit

## 2019-04-21 ENCOUNTER — Ambulatory Visit (HOSPITAL_COMMUNITY): Payer: Medicare Other

## 2019-04-21 ENCOUNTER — Other Ambulatory Visit (HOSPITAL_COMMUNITY)
Admission: RE | Admit: 2019-04-21 | Discharge: 2019-04-21 | Disposition: A | Discharge status: Home / Self Care | Payer: Medicare Other | Source: Ambulatory Visit | Attending: Cardiovascular Disease | Admitting: Cardiovascular Disease

## 2019-04-21 ENCOUNTER — Other Ambulatory Visit: Payer: Self-pay

## 2019-04-21 DIAGNOSIS — Z1159 Encounter for screening for other viral diseases: Secondary | ICD-10-CM | POA: Insufficient documentation

## 2019-04-21 DIAGNOSIS — Z01818 Encounter for other preprocedural examination: Secondary | ICD-10-CM | POA: Insufficient documentation

## 2019-04-21 LAB — SARS CORONAVIRUS 2 BY RT PCR (HOSPITAL ORDER, PERFORMED IN ~~LOC~~ HOSPITAL LAB): SARS Coronavirus 2: NEGATIVE

## 2019-04-24 ENCOUNTER — Ambulatory Visit (HOSPITAL_BASED_OUTPATIENT_CLINIC_OR_DEPARTMENT_OTHER): Payer: Medicare Other | Attending: Cardiovascular Disease | Admitting: Cardiovascular Disease

## 2019-04-24 ENCOUNTER — Other Ambulatory Visit: Payer: Self-pay

## 2019-04-24 VITALS — Ht 69.0 in | Wt 245.0 lb

## 2019-04-24 DIAGNOSIS — Z79899 Other long term (current) drug therapy: Secondary | ICD-10-CM | POA: Diagnosis not present

## 2019-04-24 DIAGNOSIS — I10 Essential (primary) hypertension: Secondary | ICD-10-CM | POA: Diagnosis not present

## 2019-04-24 DIAGNOSIS — Z7901 Long term (current) use of anticoagulants: Secondary | ICD-10-CM | POA: Diagnosis not present

## 2019-04-24 DIAGNOSIS — Z6836 Body mass index (BMI) 36.0-36.9, adult: Secondary | ICD-10-CM | POA: Insufficient documentation

## 2019-04-24 DIAGNOSIS — R0902 Hypoxemia: Secondary | ICD-10-CM | POA: Diagnosis not present

## 2019-04-24 DIAGNOSIS — G4736 Sleep related hypoventilation in conditions classified elsewhere: Secondary | ICD-10-CM | POA: Diagnosis not present

## 2019-04-24 DIAGNOSIS — I480 Paroxysmal atrial fibrillation: Secondary | ICD-10-CM

## 2019-04-24 DIAGNOSIS — Z79890 Hormone replacement therapy (postmenopausal): Secondary | ICD-10-CM | POA: Diagnosis not present

## 2019-04-24 DIAGNOSIS — E66813 Obesity, class 3: Secondary | ICD-10-CM

## 2019-04-24 DIAGNOSIS — G4761 Periodic limb movement disorder: Secondary | ICD-10-CM | POA: Diagnosis not present

## 2019-04-24 DIAGNOSIS — G4733 Obstructive sleep apnea (adult) (pediatric): Secondary | ICD-10-CM

## 2019-05-01 ENCOUNTER — Encounter (HOSPITAL_BASED_OUTPATIENT_CLINIC_OR_DEPARTMENT_OTHER): Payer: Self-pay | Admitting: Cardiovascular Disease

## 2019-05-01 NOTE — Procedures (Signed)
Patient Name: Leslie Alexander, Leslie Alexander Date: 04/24/2019 Gender: Female D.O.B: Jun 30, 1941 Age (years): 62 Referring Provider: Rachelle Hora Croitoru Height (inches): 69 Interpreting Physician: Nicki Guadalajara MD, ABSM Weight (lbs): 245 RPSGT: Ulyess Mort BMI: 36 MRN: [id number] Neck Size: 14.50  CLINICAL INFORMATION Sleep Study Type: NPSG  Indication for sleep study: Fatigue, Hypertension, Obesity, Snoring  Epworth Sleepiness Score: 15  SLEEP STUDY TECHNIQUE As per the AASM Manual for the Scoring of Sleep and Associated Events v2.3 (April 2016) with a hypopnea requiring 4% desaturations.  The channels recorded and monitored were frontal, central and occipital EEG, electrooculogram (EOG), submentalis EMG (chin), nasal and oral airflow, thoracic and abdominal wall motion, anterior tibialis EMG, snore microphone, electrocardiogram, and pulse oximetry.  MEDICATIONS     acetaminophen (TYLENOL) 325 MG tablet         allopurinol (ZYLOPRIM) 300 MG tablet         apixaban (ELIQUIS) 5 MG TABS tablet         atorvastatin (LIPITOR) 20 MG tablet         DULoxetine (CYMBALTA) 60 MG capsule         fexofenadine (ALLEGRA) 180 MG tablet         gabapentin (NEURONTIN) 300 MG capsule         levothyroxine (SYNTHROID, LEVOTHROID) 100 MCG tablet         metolazone (ZAROXOLYN) 2.5 MG tablet         metoprolol succinate (TOPROL-XL) 50 MG 24 hr tablet         Multiple Vitamins-Minerals (MULTIVITAMINS THER. W/MINERALS) TABS tablet         multivitamin-lutein (OCUVITE-LUTEIN) CAPS capsule         omeprazole (PRILOSEC) 20 MG capsule         polyethylene glycol powder (GLYCOLAX/MIRALAX) powder         potassium chloride SA (K-DUR,KLOR-CON) 20 MEQ tablet         primidone (MYSOLINE) 50 MG tablet         torsemide (DEMADEX) 20 MG tablet (Expired)         traZODone (DESYREL) 50 MG tablet       Medications self-administered by patient taken the night of the study : TRAZODONE, EXTRA STRENGTH TYLENOL,  ATORVASTATIN, eliquis, GABAPENTIN, PRIMIDONE  SLEEP ARCHITECTURE The study was initiated at 10:33:16 PM and ended at 4:56:44 AM.  Sleep onset time was 5.9 minutes and the sleep efficiency was 91.5%%. The total sleep time was 351 minutes.  Stage REM latency was 268.0 minutes.  The patient spent 16.0%% of the night in stage N1 sleep, 83.6%% in stage N2 sleep, 0.0%% in stage N3 and 0.4% in REM.  Alpha intrusion was absent.  Supine sleep was 11.18%.  RESPIRATORY PARAMETERS The overall apnea/hypopnea index (AHI) was 8.0 per hour. The respiratory disturbance index (RDI) was 12.3/h. There were 0 total apneas, including 0 obstructive, 0 central and 0 mixed apneas. There were 47 hypopneas and 25 RERAs.  The AHI during Stage REM sleep was 0.0 per hour.  AHI while supine was 24.5 per hour.  The mean oxygen saturation was 93.4%. The minimum SpO2 during sleep was 89.0%.  Soft snoring was noted during this study.  CARDIAC DATA The 2 lead EKG demonstrated sinus rhythm. The mean heart rate was 71.8 beats per minute. Other EKG findings include: Atrial Fibrillation, PVCs.    LEG MOVEMENT DATA The total PLMS were 397 with a resulting PLMS index of 67.9. Associated arousal with leg movement index was  5.1 .  IMPRESSIONS - Mild obstructive sleep apnea occurred during this study (AHI 8.0/h; RDI 12.3/h); however, events were moderate in severity with supine position (24.5/h). There was very minimal REM sleep at 0.4%. - No significant central sleep apnea occurred during this study (CAI = 0.0/h). - The patient had minimal or no oxygen desaturation during the study (Min O2 = 89.0%) - The patient snored with soft snoring volume. - EKG findings include Atrial Fibrillation, PVCs. - Clinically significant periodic limb movements did not occur during sleep. Associated arousals were significant.  DIAGNOSIS - Obstructive Sleep Apnea (327.23 [G47.33 ICD-10]) - Periodic Limb Movement During Sleep (327.51  [G47.61 ICD-10]) - Nocturnal Hypoxemia (327.26 [G47.36 ICD-10])  RECOMMENDATIONS - In this symptomatic patient with significant cardiovascuilar co-morbidities, recommend therapeutic CPAP titration to determine optimal pressure required to alleviate sleep disordered breathing. - Effort should be made to optimize nasal and oropharyngeal patency. - Positional therapy avoiding supine position during sleep. - If patient is symptomatic with restless legs consider a trial of pharmacotherapy for treatment of Periodic Leg Movements of Sleep. - Avoid alcohol, sedatives and other CNS depressants that may worsen sleep apnea and disrupt normal sleep architecture. - Sleep hygiene should be reviewed to assess factors that may improve sleep quality. - Weight management (BMI 36) and regular exercise should be initiated or continued if appropriate.  [Electronically signed] 05/01/2019 03:36 PM  Shelva Majestic MD, [facility], ABSM Diplomate, American Board of Sleep Medicine   NPI: [id number]  [place] PH: [phone]   FX: [phone] [place]

## 2019-05-10 ENCOUNTER — Telehealth: Payer: Self-pay | Admitting: *Deleted

## 2019-05-10 ENCOUNTER — Other Ambulatory Visit: Payer: Self-pay | Admitting: Cardiovascular Disease

## 2019-05-10 ENCOUNTER — Other Ambulatory Visit (HOSPITAL_BASED_OUTPATIENT_CLINIC_OR_DEPARTMENT_OTHER): Payer: Self-pay

## 2019-05-10 DIAGNOSIS — I10 Essential (primary) hypertension: Secondary | ICD-10-CM

## 2019-05-10 DIAGNOSIS — IMO0002 Reserved for concepts with insufficient information to code with codable children: Secondary | ICD-10-CM

## 2019-05-10 DIAGNOSIS — I480 Paroxysmal atrial fibrillation: Secondary | ICD-10-CM

## 2019-05-10 DIAGNOSIS — G4736 Sleep related hypoventilation in conditions classified elsewhere: Secondary | ICD-10-CM

## 2019-05-10 DIAGNOSIS — G4733 Obstructive sleep apnea (adult) (pediatric): Secondary | ICD-10-CM

## 2019-05-10 NOTE — Telephone Encounter (Signed)
Patient notified of sleep study results and recommendations. CPAP titration scheduled for June 30. COVID  Test scheduled on 05/18/28 @ 2:00 pm.

## 2019-05-12 ENCOUNTER — Encounter (HOSPITAL_BASED_OUTPATIENT_CLINIC_OR_DEPARTMENT_OTHER): Payer: Medicare Other

## 2019-05-19 ENCOUNTER — Other Ambulatory Visit: Payer: Self-pay

## 2019-05-19 ENCOUNTER — Other Ambulatory Visit (HOSPITAL_COMMUNITY)
Admission: RE | Admit: 2019-05-19 | Discharge: 2019-05-19 | Disposition: A | Discharge status: Home / Self Care | Payer: Medicare Other | Source: Ambulatory Visit | Attending: Cardiovascular Disease | Admitting: Cardiovascular Disease

## 2019-05-19 DIAGNOSIS — Z1159 Encounter for screening for other viral diseases: Secondary | ICD-10-CM | POA: Diagnosis present

## 2019-05-20 LAB — NOVEL CORONAVIRUS, NAA (HOSP ORDER, SEND-OUT TO REF LAB; TAT 18-24 HRS): SARS-CoV-2, NAA: NOT DETECTED

## 2019-05-23 ENCOUNTER — Ambulatory Visit: Payer: Medicare Other | Attending: Cardiovascular Disease | Admitting: Cardiovascular Disease

## 2019-05-23 ENCOUNTER — Other Ambulatory Visit: Payer: Self-pay

## 2019-05-23 DIAGNOSIS — G4736 Sleep related hypoventilation in conditions classified elsewhere: Secondary | ICD-10-CM | POA: Insufficient documentation

## 2019-05-23 DIAGNOSIS — I10 Essential (primary) hypertension: Secondary | ICD-10-CM | POA: Diagnosis not present

## 2019-05-23 DIAGNOSIS — I480 Paroxysmal atrial fibrillation: Secondary | ICD-10-CM | POA: Insufficient documentation

## 2019-05-23 DIAGNOSIS — Z6841 Body Mass Index (BMI) 40.0 and over, adult: Secondary | ICD-10-CM | POA: Insufficient documentation

## 2019-05-23 DIAGNOSIS — Z79899 Other long term (current) drug therapy: Secondary | ICD-10-CM | POA: Diagnosis not present

## 2019-05-23 DIAGNOSIS — IMO0002 Reserved for concepts with insufficient information to code with codable children: Secondary | ICD-10-CM

## 2019-05-23 DIAGNOSIS — G4733 Obstructive sleep apnea (adult) (pediatric): Secondary | ICD-10-CM

## 2019-05-23 DIAGNOSIS — Z7901 Long term (current) use of anticoagulants: Secondary | ICD-10-CM | POA: Diagnosis not present

## 2019-05-29 ENCOUNTER — Encounter: Payer: Self-pay | Admitting: Cardiovascular Disease

## 2019-05-29 NOTE — Procedures (Signed)
[facility]   Patient Name: Leslie Alexander, Leslie Alexander Date: 05/23/2019 Gender: Female D.O.B: 04-13-1941 Age (years): 89 Referring Provider: Shelva Majestic MD, ABSM Height (inches): 69 Interpreting Physician: Shelva Majestic MD, ABSM Weight (lbs): 245 RPSGT: Rosebud Poles BMI: 36 MRN: [id number] Neck Size: 15.00  CLINICAL INFORMATION The patient is referred for a CPAP titration to treat sleep apnea.  Date of NPSG: 04/24/2019: AHI 8/h; RDI 12.3/h; supine AHI 24.5/h; minimal REM sleep (only 0.4 minutes); O2 nadir 89%.  SLEEP STUDY TECHNIQUE As per the AASM Manual for the Scoring of Sleep and Associated Events v2.3 (April 2016) with a hypopnea requiring 4% desaturations.  The channels recorded and monitored were frontal, central and occipital EEG, electrooculogram (EOG), submentalis EMG (chin), nasal and oral airflow, thoracic and abdominal wall motion, anterior tibialis EMG, snore microphone, electrocardiogram, and pulse oximetry. Continuous positive airway pressure (CPAP) was initiated at the beginning of the study and titrated to treat sleep-disordered breathing.  MEDICATIONS     acetaminophen (TYLENOL) 325 MG tablet             allopurinol (ZYLOPRIM) 300 MG tablet         apixaban (ELIQUIS) 5 MG TABS tablet         atorvastatin (LIPITOR) 20 MG tablet         DULoxetine (CYMBALTA) 60 MG capsule         fexofenadine (ALLEGRA) 180 MG tablet         gabapentin (NEURONTIN) 300 MG capsule         levothyroxine (SYNTHROID, LEVOTHROID) 100 MCG tablet         metolazone (ZAROXOLYN) 2.5 MG tablet         metoprolol succinate (TOPROL-XL) 50 MG 24 hr tablet         Multiple Vitamins-Minerals (MULTIVITAMINS THER. W/MINERALS) TABS tablet         multivitamin-lutein (OCUVITE-LUTEIN) CAPS capsule         omeprazole (PRILOSEC) 20 MG capsule         polyethylene glycol powder (GLYCOLAX/MIRALAX) powder         potassium chloride SA (K-DUR,KLOR-CON) 20 MEQ tablet      primidone (MYSOLINE) 50 MG tablet         torsemide (DEMADEX) 20 MG tablet (Expired)         traZODone (DESYREL) 50 MG tablet      Medications self-administered by patient taken the night of the study : TRAZODONE, EXTRA STRENGTH TYLENOL, ATORVASTATIN, eliquis, GABAPENTIN, PRIMIDONE  TECHNICIAN COMMENTS Comments added by technician: Patient tolerated CPAP very well. CPAP therapy started at 4 cm of H2O, increased to 8 cm of H2O due to events observed . Optimal pressure was not obtained due to patients' inability to sleep in supine position. PLMS noticed through out study. Patient c/o of severe hip pain during the entire therapy period Comments added by scorer: N/A  RESPIRATORY PARAMETERS Optimal PAP Pressure (cm):  AHI at Optimal Pressure (/hr): N/A Overall Minimal O2 (%): 87.0 Supine % at Optimal Pressure (%): N/A Minimal O2 at Optimal Pressure (%): 87.0   SLEEP ARCHITECTURE The study was initiated at 10:19:35 PM and ended at 4:55:06 AM.  Sleep onset time was 24.3 minutes and the sleep efficiency was 82.9%%. The total sleep time was 328 minutes.  The patient spent 0.5%% of the night in stage N1 sleep, 62.2%% in stage N2 sleep, 35.2%% in stage N3 and 2.1% in REM.Stage REM latency was 285.0 minutes  Wake after  sleep onset was 43.3. Alpha intrusion was absent. Supine sleep was 5.81%.  CARDIAC DATA The 2 lead EKG demonstrated atrial fibrillation. The mean heart rate was 75.7 beats per minute. Other EKG findings include: PVCs.  LEG MOVEMENT DATA The total Periodic Limb Movements of Sleep (PLMS) were 2. The PLMS index was 0.4. A PLMS index of <15 is considered normal in adults.  IMPRESSIONS - CPAP was initiated at 4 cm and was titrated to 8 cm of water. AHI at 8 cm was 0/h with O2 nadir at 92%. An optimal PAP pressure could not be selected for this patient due to the inability to sleep supine as a result of significant hip discomfort. - Central sleep apnea was not noted during this  titration (CAI = 0.0/h). - Mild oxygen desaturations to a nadir of 87%. - The patient snored with soft snoring volume during this titration study. - 2-lead EKG demonstrated: PVCs - Clinically significant periodic limb movements were not noted during this study. Arousals associated with PLMs were rare.  DIAGNOSIS - Obstructive Sleep Apnea (327.23 [G47.33 ICD-10])  RECOMMENDATIONS - Recommend an initial trial of CPAP Auto 8-14 with EPR of 3 and heated humidification.  - Avoid alcohol, sedatives and other CNS depressants that may worsen sleep apnea and disrupt normal sleep architecture. - Sleep hygiene should be reviewed to assess factors that may improve sleep quality. - Weight management (BMI 36) and regular exercise should be initiated or continued.   [Electronically signed] 05/29/2019 01:04 PM  Nicki Guadalajara MD, [facility], ABSM Diplomate, American Board of Sleep Medicine   NPI: [id number] [facility] PH: [phone]   FX: [phone] ACCREDITED BY THE AMERICAN ACADEMY OF SLEEP MEDICINE

## 2019-06-06 ENCOUNTER — Telehealth: Payer: Self-pay | Admitting: *Deleted

## 2019-06-06 NOTE — Telephone Encounter (Signed)
-----   Message from Thomas A Kelly, MD sent at 05/29/2019  1:11 PM EDT ----- Eliceo Gladu please notify pt the results and set up with DME for CPAP 

## 2019-06-07 ENCOUNTER — Telehealth: Payer: Self-pay | Admitting: Neurology

## 2019-06-07 NOTE — Telephone Encounter (Signed)
Called patient daughter wendy no answer left message with provider response

## 2019-06-07 NOTE — Telephone Encounter (Signed)
See last note.  Nothing I can do about tremor while she is on eliquis unfortunately.  We discussed this at her visit as well.

## 2019-06-07 NOTE — Telephone Encounter (Signed)
Pt c/o increasing tremor Current tremor meds AND times they are taken NOT taken anything for tremors at this time Pts next appointment: Visit date not found   Pt daughter requesting patient start back on something for tremors. Review office note from 04/07/19 patient states he has weaned off primidone, she states that she contact PCP office they informed her to call neurology regarding patient tremors.  Denies confusion/ hallucinations, she says for her age memory is fine

## 2019-06-07 NOTE — Telephone Encounter (Signed)
Lelon Huh (on [place]) no answer left message to call office back regarding message below

## 2019-06-07 NOTE — Telephone Encounter (Signed)
Daughter was calling in about her tremors being worse. And she has taken her off the Primidone medication that Dr. Carles Collet recommended. She said she called the heart doctor about the Eliquis and they did not want to take her off of that. Thanks!

## 2019-06-08 ENCOUNTER — Telehealth: Payer: Self-pay | Admitting: *Deleted

## 2019-06-08 NOTE — Telephone Encounter (Signed)
Spoke with patient in reference to her  Getting a CPAP machine. She states that her PCP has already ordered her one. I will follow up to see  What DME company she received it from.

## 2019-06-08 NOTE — Telephone Encounter (Signed)
-----   Message from Leslie Sine, MD sent at 05/29/2019  1:11 PM EDT ----- [name] please notify pt the results and set up with DME for CPAP

## 2019-06-08 NOTE — Telephone Encounter (Signed)
Daughter Abigail Butts called to inform me the patient gets confused and she does not have a CPAP machine. Instructs me to go ahead with Dr Evette Georges original order. She requests for it to be sent to [facility] in [place].

## 2019-07-10 ENCOUNTER — Other Ambulatory Visit: Payer: Self-pay

## 2019-07-10 ENCOUNTER — Encounter: Payer: Self-pay | Admitting: Cardiovascular Disease

## 2019-07-10 ENCOUNTER — Ambulatory Visit: Payer: Medicare Other | Admitting: Cardiovascular Disease

## 2019-07-10 VITALS — BP 122/70 | HR 77 | Temp 97.0°F | Ht 65.5 in | Wt 248.0 lb

## 2019-07-10 DIAGNOSIS — I2510 Atherosclerotic heart disease of native coronary artery without angina pectoris: Secondary | ICD-10-CM

## 2019-07-10 DIAGNOSIS — N183 Chronic kidney disease, stage 3 unspecified: Secondary | ICD-10-CM

## 2019-07-10 DIAGNOSIS — I4819 Other persistent atrial fibrillation: Secondary | ICD-10-CM | POA: Diagnosis not present

## 2019-07-10 DIAGNOSIS — G4733 Obstructive sleep apnea (adult) (pediatric): Secondary | ICD-10-CM | POA: Diagnosis not present

## 2019-07-10 DIAGNOSIS — I5042 Chronic combined systolic (congestive) and diastolic (congestive) heart failure: Secondary | ICD-10-CM

## 2019-07-10 DIAGNOSIS — G250 Essential tremor: Secondary | ICD-10-CM

## 2019-07-10 DIAGNOSIS — I10 Essential (primary) hypertension: Secondary | ICD-10-CM

## 2019-07-10 DIAGNOSIS — I2583 Coronary atherosclerosis due to lipid rich plaque: Secondary | ICD-10-CM

## 2019-07-10 DIAGNOSIS — I447 Left bundle-branch block, unspecified: Secondary | ICD-10-CM

## 2019-07-10 NOTE — Progress Notes (Signed)
SUBJECTIVE: Leslie Alexander is a 78 y.o. female with a past medical history of persistent atrial fibrillation (on Eliquis for anticoagulation), chroniccombined systolic anddiastolic CHF(EF 40-45% by echo in 12/2018), chronic LBBB, mild nonobstructive CAD by cath in February 2020, HTN, morbid obesity, OSA, Pulmonary HTN, and Stage 3 CKD.   She denies chest pain, palpitations, shortness of breath, and leg swelling.  She told me about her daughter, Toniann Fail, whom she lives with.  Wendy's fianc has stage IV cancer which is metastatic to the bones and Toniann Fail helps to take care of him.    Soc: Her daughter, Toniann Fail, lives with her.  Review of Systems: As per "subjective", otherwise negative.  Allergies  Allergen Reactions  . Codeine Palpitations  . Latex   . Ace Inhibitors Cough    Current Outpatient Medications  Medication Sig Dispense Refill  . acetaminophen (TYLENOL) 325 MG tablet Take 650 mg by mouth 3 (three) times daily.    Marland Kitchen allopurinol (ZYLOPRIM) 300 MG tablet Take 300 mg daily by mouth.  6  . apixaban (ELIQUIS) 5 MG TABS tablet Take 1 tablet (5 mg total) by mouth 2 (two) times daily. 180 tablet 3  . atorvastatin (LIPITOR) 20 MG tablet Take 1 tablet (20 mg total) by mouth daily at 6 PM. 30 tablet 0  . carbidopa-levodopa (SINEMET IR) 10-100 MG tablet Take 1 tablet by mouth 2 (two) times daily.    . DULoxetine (CYMBALTA) 60 MG capsule Take 60 mg by mouth daily.     . fexofenadine (ALLEGRA) 180 MG tablet Take 180 mg by mouth daily.    Marland Kitchen gabapentin (NEURONTIN) 300 MG capsule Take 300 mg by mouth 3 (three) times daily.    Marland Kitchen levothyroxine (SYNTHROID, LEVOTHROID) 100 MCG tablet Take 100 mcg by mouth daily.    Marland Kitchen losartan (COZAAR) 25 MG tablet Take 25 mg by mouth daily.    . Multiple Vitamins-Minerals (MULTIVITAMINS THER. W/MINERALS) TABS tablet Take 1 tablet by mouth daily.    . multivitamin-lutein (OCUVITE-LUTEIN) CAPS capsule Take 1 capsule by mouth daily.    Marland Kitchen omeprazole  (PRILOSEC) 20 MG capsule Take 20 mg by mouth daily.    . polyethylene glycol powder (GLYCOLAX/MIRALAX) powder Take 17 g daily as needed by mouth.  6  . potassium chloride SA (K-DUR,KLOR-CON) 20 MEQ tablet Take 1 tablet (20 mEq total) by mouth daily. 30 tablet 0  . primidone (MYSOLINE) 50 MG tablet Take 100 mg by mouth 3 (three) times daily.     Marland Kitchen torsemide (DEMADEX) 20 MG tablet Take 2 tablets (40 mg total) by mouth 2 (two) times daily for 30 days. 120 tablet 0  . traZODone (DESYREL) 50 MG tablet Take 50 mg by mouth at bedtime.      No current facility-administered medications for this visit.     Past Medical History:  Diagnosis Date  . Atrial fibrillation (HCC)   . CAD (coronary artery disease)    a. mild nonobstructive CAD by cath in 11/2014. b. repeat cath in 12/2018 showing minor CAD; noted to have moderate pulmonary HTN  . Chronic combined systolic (congestive) and diastolic (congestive) heart failure (HCC)    a. EF reduced to 40-45% by echo in 12/2018  . Depression   . Essential hypertension, benign   . GERD (gastroesophageal reflux disease)   . History of breast cancer   . Hx of atrial fibrillation, no current medication    converted out of A. Fib with cardioversion  . Hypothyroidism   .  Left bundle branch block    negative Lexiscan Myoview; EF 56%, 3/13   . Lumbar disc disease   . Meralgia paresthetica, right   . Mixed hyperlipidemia   . Morbid obesity ([place])   . Nonalcoholic fatty liver disease   . OSA (obstructive sleep apnea)    CPAP machine  . Pulmonary hypertension (HCC)    RVSP 55-60 mm mercury    Past Surgical History:  Procedure Laterality Date  . CATARACT EXTRACTION W/PHACO Left 12/18/2013   Procedure: CATARACT EXTRACTION PHACO AND INTRAOCULAR LENS PLACEMENT (IOC);  Surgeon: Tonny Branch, MD;  Location: [facility];  Service: Ophthalmology;  Laterality: Left;  CDE 12.70  . CATARACT EXTRACTION, BILATERAL  04/19/2008   Dr. Geoffry Paradise  . FEMORAL EXPLORATION  12/2003   RIGHT  LATERAL FEMORAL CUTANEOUS NERVE/Dr. Carloyn Manner  . HEEL SPUR SURGERY  07/2006   For spurs/Dr. Irving Shows  . INCISION AND DRAINAGE OF WOUND  01/26/2012   Dr. Alphonzo Cruise  . KNEE ARTHROSCOPY  10/2005   Left knee/from torn cartilage Dr. Garfield Cornea  . LAPAROSCOPIC CHOLECYSTECTOMY  03/02/2011   Dr. Anthony Sar  . LEFT HEART CATHETERIZATION WITH CORONARY ANGIOGRAM N/A 12/20/2014   Procedure: LEFT HEART CATHETERIZATION WITH CORONARY ANGIOGRAM;  Surgeon: Peter M Martinique, MD;  Location: [facility] CATH LAB;  Service: Cardiovascular;  Laterality: N/A;  . MASTECTOMY, RADICAL Left 1989   With chemotherapy  . PARS PLANA VITRECTOMY W/ REPAIR OF MACULAR HOLE  05/2008   Dr. Zadie Rhine  . RIGHT/LEFT HEART CATH AND CORONARY ANGIOGRAPHY N/A 01/18/2019   Procedure: RIGHT/LEFT HEART CATH AND CORONARY ANGIOGRAPHY;  Surgeon: Martinique, Peter M, MD;  Location: [place] CV LAB;  Service: Cardiovascular;  Laterality: N/A;  . TOTAL KNEE ARTHROPLASTY Left 12/29/2011   Dr. Alphonzo Cruise  . TUBAL LIGATION     BILATERAL    Social History   Socioeconomic History  . Marital status: Widowed    Spouse name: Not on file  . Number of children: 1  . Years of education: 49  . Highest education level: High school graduate  Occupational History  . Occupation: retired Systems analyst  Social Needs  . Financial resource strain: Not on file  . Food insecurity    Worry: Not on file    Inability: Not on file  . Transportation needs    Medical: Not on file    Non-medical: Not on file  Tobacco Use  . Smoking status: Former Smoker    Packs/day: 0.50    Years: 10.00    Pack years: 5.00    Types: Cigarettes    Start date: 06/03/1965    Quit date: 11/24/1987    Years since quitting: 31.6  . Smokeless tobacco: Never Used  Substance and Sexual Activity  . Alcohol use: No    Alcohol/week: 0.0 standard drinks  . Drug use: No  . Sexual activity: Never  Lifestyle  . Physical activity    Days per week: Not on file    Minutes per session: Not on file  .  Stress: Not on file  Relationships  . Social Herbalist on phone: Not on file    Gets together: Not on file    Attends religious service: Not on file    Active member of club or organization: Not on file    Attends meetings of clubs or organizations: Not on file    Relationship status: Not on file  . Intimate partner violence    Fear of current or ex partner: Not on file  Emotionally abused: Not on file    Physically abused: Not on file    Forced sexual activity: Not on file  Other Topics Concern  . Not on file  Social History Narrative   Has 1 daughter that lives with her in a one story home.  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  Education: high school.      Vitals:   07/10/19 1320  BP: 122/70  Pulse: 77  Temp: (!) 97 F (36.1 C)  SpO2: 96%  Weight: 248 lb (112.5 kg)  Height: 5' 5.5" (1.664 m)    Wt Readings from Last 3 Encounters:  07/10/19 248 lb (112.5 kg)  04/24/19 245 lb (111.1 kg)  04/07/19 244 lb (110.7 kg)     PHYSICAL EXAM General: NAD HEENT: Normal. Neck: No JVD, no thyromegaly. Lungs: Clear to auscultation bilaterally with normal respiratory effort. CV: Regular rate and rhythm, normal S1/S2, no S3/S4, no murmur. No pretibial or periankle edema.  No carotid bruit.   Abdomen: Soft, nontender, no distention.  Neurologic: Alert and oriented.  Psych: Normal affect. Skin: Normal. Musculoskeletal: No gross deformities.    ECG: Reviewed above under Subjective   Labs: Lab Results  Component Value Date/Time   K 4.4 01/27/2019 04:00 PM   BUN 32 (H) 01/27/2019 04:00 PM   CREATININE 1.24 (H) 01/27/2019 04:00 PM   ALT 52 (H) 01/16/2019 01:28 PM   TSH 1.768 01/16/2019 01:28 PM   HGB 12.2 01/19/2019 04:23 AM     Lipids: Lab Results  Component Value Date/Time   LDLCALC 135 (H) 12/19/2014 06:45 AM   CHOL 190 12/19/2014 06:45 AM   TRIG 74 12/19/2014 06:45 AM   HDL 40 12/19/2014 06:45 AM     Echocardiogram: 01/12/2019 IMPRESSIONS 1. The  cavity size was mildly dilated. There is mildly increased left ventricular wall thickness. Left ventricular ejection fraction of 40-45% with septal motion suggestive of LBBB. Left ventricular diastolic Doppler parameters are indeterminate. There  is right ventricular volume overload. 2. The right ventricle has mildly reduced systolic function. The cavity was moderately enlarged. There is no increase in right ventricular wall thickness. Right ventricular systolic pressure is moderately elevated with an estimated pressure of 54.4  mmHg. 3. The aortic valve is tricuspid Aortic valve regurgitation is trivial by color flow Doppler.. Mild aortic annular calcification noted. 4. The mitral valve is normal in structure. There is mild mitral annular calcification present. Mitral valve regurgitation is moderate by color flow Doppler. 5. The tricuspid valve is normal in structure. Tricuspid valve regurgitation is moderate-severe. 6. The aortic root is normal in size and structure. 7. Right atrial size was moderately dilated. 8. Left atrial size was mildly dilated. 9. The inferior vena cava was dilated in size with <50% respiratory variability.  Cardiac Catheterization: 01/18/2019  LV end diastolic pressure is mildly elevated.  Hemodynamic findings consistent with moderate pulmonary hypertension.  1. Minor nonobstructive CAD 2. Mildly elevated LV filling pressures 3. Moderate pulmonary HTN. 4. Cardiac index 2.66.     ASSESSMENT AND PLAN:  1. Chronic combined CHF/NICM: Euvolemic on torsemide 40 mg bid and metolazone 2.5 mg once per week. Remains on Toprol-XL 50 mg daily and losartan 12.5 mg.  She is scheduled to have blood work in September. SCr 1.24 on 01/27/19. She weighs daily.  2. Persistent atrial fibrillation: HR controlled on Toprol-XL 50 mg daily.  Currently in a regular rhythm.  Anticoagulated with Eliquis 5 mg bid.  3. HTN: Blood pressure is normal.  No changes to therapy.  4. OSA: Follows with Dr. Tresa EndoKelly.  CPAP use was encouraged.  5. Morbid obesity: Needs significant weight loss.  6. CKD:  SCr 1.24 on 01/27/19.  She is scheduled to have blood work in September.  7. LBBB: Chronic.  8. CAD: Mild, nonobstructive disease. On atorvastatin.  9. 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  Had been on primidone but is now on Sinemet.   Disposition: Follow up 6 months   Prentice DockerSuresh Librada Castronovo, M.D., F.A.C.C.

## 2019-07-10 NOTE — Patient Instructions (Addendum)
Medication Instructions:   Your physician recommends that you continue on your current medications as directed. Please refer to the Current Medication list given to you today.  Labwork:  NONE-please have your lab work that is scheduled for 07/27/2019 with your family doctor sent to our office.  Testing/Procedures:  NONE  Follow-Up:  Your physician recommends that you schedule a follow-up appointment in: 6 months. You will receive a reminder letter in the mail in about 4 months reminding you to call and schedule your appointment. If you don't receive this letter, please contact our office.  Any Other Special Instructions Will Be Listed Below (If Applicable).  If you need a refill on your cardiac medications before your next appointment, please call your pharmacy.

## 2020-02-08 ENCOUNTER — Telehealth: Payer: Self-pay | Admitting: Cardiovascular Disease

## 2020-02-08 NOTE — Telephone Encounter (Signed)
Pt daughter made aware that they or pt would need to ask [facility] to transfer pt to [facility] facility - daughter voiced understanding

## 2020-02-08 NOTE — Telephone Encounter (Signed)
Patient is currently in [facility] and they would like to know how they can get her mom transferred to a [facility]

## 2020-02-11 ENCOUNTER — Inpatient Hospital Stay (HOSPITAL_COMMUNITY)
Admission: AD | Admit: 2020-02-11 | Discharge: 2020-02-22 | DRG: 291 | Disposition: E | Payer: Medicare PPO | Source: Other Acute Inpatient Hospital | Attending: Emergency Medicine | Admitting: Emergency Medicine

## 2020-02-11 DIAGNOSIS — Z9119 Patient's noncompliance with other medical treatment and regimen: Secondary | ICD-10-CM

## 2020-02-11 DIAGNOSIS — M109 Gout, unspecified: Secondary | ICD-10-CM | POA: Diagnosis present

## 2020-02-11 DIAGNOSIS — K7200 Acute and subacute hepatic failure without coma: Secondary | ICD-10-CM | POA: Diagnosis present

## 2020-02-11 DIAGNOSIS — I482 Chronic atrial fibrillation, unspecified: Secondary | ICD-10-CM | POA: Diagnosis not present

## 2020-02-11 DIAGNOSIS — E162 Hypoglycemia, unspecified: Secondary | ICD-10-CM | POA: Diagnosis present

## 2020-02-11 DIAGNOSIS — J9600 Acute respiratory failure, unspecified whether with hypoxia or hypercapnia: Secondary | ICD-10-CM | POA: Diagnosis present

## 2020-02-11 DIAGNOSIS — Z9221 Personal history of antineoplastic chemotherapy: Secondary | ICD-10-CM

## 2020-02-11 DIAGNOSIS — J9601 Acute respiratory failure with hypoxia: Secondary | ICD-10-CM | POA: Diagnosis present

## 2020-02-11 DIAGNOSIS — N183 Chronic kidney disease, stage 3 unspecified: Secondary | ICD-10-CM | POA: Diagnosis present

## 2020-02-11 DIAGNOSIS — E039 Hypothyroidism, unspecified: Secondary | ICD-10-CM | POA: Diagnosis present

## 2020-02-11 DIAGNOSIS — I428 Other cardiomyopathies: Secondary | ICD-10-CM | POA: Diagnosis present

## 2020-02-11 DIAGNOSIS — I4821 Permanent atrial fibrillation: Secondary | ICD-10-CM | POA: Diagnosis present

## 2020-02-11 DIAGNOSIS — G259 Extrapyramidal and movement disorder, unspecified: Secondary | ICD-10-CM | POA: Diagnosis present

## 2020-02-11 DIAGNOSIS — I480 Paroxysmal atrial fibrillation: Secondary | ICD-10-CM | POA: Diagnosis present

## 2020-02-11 DIAGNOSIS — N179 Acute kidney failure, unspecified: Secondary | ICD-10-CM | POA: Diagnosis present

## 2020-02-11 DIAGNOSIS — Z79899 Other long term (current) drug therapy: Secondary | ICD-10-CM

## 2020-02-11 DIAGNOSIS — E875 Hyperkalemia: Secondary | ICD-10-CM | POA: Diagnosis present

## 2020-02-11 DIAGNOSIS — Z9012 Acquired absence of left breast and nipple: Secondary | ICD-10-CM

## 2020-02-11 DIAGNOSIS — I130 Hypertensive heart and chronic kidney disease with heart failure and stage 1 through stage 4 chronic kidney disease, or unspecified chronic kidney disease: Secondary | ICD-10-CM | POA: Diagnosis present

## 2020-02-11 DIAGNOSIS — Z6841 Body Mass Index (BMI) 40.0 and over, adult: Secondary | ICD-10-CM

## 2020-02-11 DIAGNOSIS — R791 Abnormal coagulation profile: Secondary | ICD-10-CM | POA: Diagnosis present

## 2020-02-11 DIAGNOSIS — E871 Hypo-osmolality and hyponatremia: Secondary | ICD-10-CM | POA: Diagnosis present

## 2020-02-11 DIAGNOSIS — Z66 Do not resuscitate: Secondary | ICD-10-CM | POA: Diagnosis present

## 2020-02-11 DIAGNOSIS — I361 Nonrheumatic tricuspid (valve) insufficiency: Secondary | ICD-10-CM | POA: Diagnosis not present

## 2020-02-11 DIAGNOSIS — G4733 Obstructive sleep apnea (adult) (pediatric): Secondary | ICD-10-CM

## 2020-02-11 DIAGNOSIS — I2510 Atherosclerotic heart disease of native coronary artery without angina pectoris: Secondary | ICD-10-CM | POA: Diagnosis present

## 2020-02-11 DIAGNOSIS — Z20822 Contact with and (suspected) exposure to covid-19: Secondary | ICD-10-CM | POA: Diagnosis present

## 2020-02-11 DIAGNOSIS — I5043 Acute on chronic combined systolic (congestive) and diastolic (congestive) heart failure: Secondary | ICD-10-CM | POA: Diagnosis present

## 2020-02-11 DIAGNOSIS — Z825 Family history of asthma and other chronic lower respiratory diseases: Secondary | ICD-10-CM

## 2020-02-11 DIAGNOSIS — I447 Left bundle-branch block, unspecified: Secondary | ICD-10-CM | POA: Diagnosis present

## 2020-02-11 DIAGNOSIS — R7989 Other specified abnormal findings of blood chemistry: Secondary | ICD-10-CM | POA: Diagnosis not present

## 2020-02-11 DIAGNOSIS — I340 Nonrheumatic mitral (valve) insufficiency: Secondary | ICD-10-CM | POA: Diagnosis not present

## 2020-02-11 DIAGNOSIS — B961 Klebsiella pneumoniae [K. pneumoniae] as the cause of diseases classified elsewhere: Secondary | ICD-10-CM | POA: Diagnosis present

## 2020-02-11 DIAGNOSIS — F329 Major depressive disorder, single episode, unspecified: Secondary | ICD-10-CM | POA: Diagnosis present

## 2020-02-11 DIAGNOSIS — Z79891 Long term (current) use of opiate analgesic: Secondary | ICD-10-CM

## 2020-02-11 DIAGNOSIS — I272 Pulmonary hypertension, unspecified: Secondary | ICD-10-CM | POA: Diagnosis not present

## 2020-02-11 DIAGNOSIS — N390 Urinary tract infection, site not specified: Secondary | ICD-10-CM | POA: Diagnosis present

## 2020-02-11 DIAGNOSIS — I5082 Biventricular heart failure: Secondary | ICD-10-CM | POA: Diagnosis present

## 2020-02-11 DIAGNOSIS — E872 Acidosis, unspecified: Secondary | ICD-10-CM | POA: Diagnosis present

## 2020-02-11 DIAGNOSIS — K219 Gastro-esophageal reflux disease without esophagitis: Secondary | ICD-10-CM | POA: Diagnosis present

## 2020-02-11 DIAGNOSIS — Z9049 Acquired absence of other specified parts of digestive tract: Secondary | ICD-10-CM

## 2020-02-11 DIAGNOSIS — Z888 Allergy status to other drugs, medicaments and biological substances status: Secondary | ICD-10-CM

## 2020-02-11 DIAGNOSIS — Z79890 Hormone replacement therapy (postmenopausal): Secondary | ICD-10-CM

## 2020-02-11 DIAGNOSIS — E782 Mixed hyperlipidemia: Secondary | ICD-10-CM | POA: Diagnosis present

## 2020-02-11 DIAGNOSIS — Z95828 Presence of other vascular implants and grafts: Secondary | ICD-10-CM

## 2020-02-11 DIAGNOSIS — Z515 Encounter for palliative care: Secondary | ICD-10-CM | POA: Diagnosis present

## 2020-02-11 DIAGNOSIS — G5711 Meralgia paresthetica, right lower limb: Secondary | ICD-10-CM | POA: Diagnosis present

## 2020-02-11 DIAGNOSIS — Z803 Family history of malignant neoplasm of breast: Secondary | ICD-10-CM

## 2020-02-11 DIAGNOSIS — Z853 Personal history of malignant neoplasm of breast: Secondary | ICD-10-CM

## 2020-02-11 DIAGNOSIS — Z7901 Long term (current) use of anticoagulants: Secondary | ICD-10-CM

## 2020-02-11 DIAGNOSIS — Z96652 Presence of left artificial knee joint: Secondary | ICD-10-CM | POA: Diagnosis present

## 2020-02-11 DIAGNOSIS — E785 Hyperlipidemia, unspecified: Secondary | ICD-10-CM

## 2020-02-11 DIAGNOSIS — M7989 Other specified soft tissue disorders: Secondary | ICD-10-CM | POA: Diagnosis not present

## 2020-02-11 DIAGNOSIS — R319 Hematuria, unspecified: Secondary | ICD-10-CM | POA: Diagnosis present

## 2020-02-11 DIAGNOSIS — R000 Tachycardia, unspecified: Secondary | ICD-10-CM | POA: Diagnosis present

## 2020-02-11 DIAGNOSIS — G20 Parkinson's disease: Secondary | ICD-10-CM | POA: Diagnosis present

## 2020-02-11 DIAGNOSIS — R0602 Shortness of breath: Secondary | ICD-10-CM

## 2020-02-11 DIAGNOSIS — Z87891 Personal history of nicotine dependence: Secondary | ICD-10-CM

## 2020-02-11 DIAGNOSIS — R570 Cardiogenic shock: Secondary | ICD-10-CM | POA: Diagnosis present

## 2020-02-11 DIAGNOSIS — L8990 Pressure ulcer of unspecified site, unspecified stage: Secondary | ICD-10-CM | POA: Diagnosis present

## 2020-02-11 DIAGNOSIS — Z91040 Latex allergy status: Secondary | ICD-10-CM

## 2020-02-11 DIAGNOSIS — K761 Chronic passive congestion of liver: Secondary | ICD-10-CM | POA: Diagnosis present

## 2020-02-11 DIAGNOSIS — Z885 Allergy status to narcotic agent status: Secondary | ICD-10-CM

## 2020-02-11 DIAGNOSIS — K5900 Constipation, unspecified: Secondary | ICD-10-CM | POA: Diagnosis present

## 2020-02-11 DIAGNOSIS — G629 Polyneuropathy, unspecified: Secondary | ICD-10-CM | POA: Diagnosis present

## 2020-02-11 DIAGNOSIS — K760 Fatty (change of) liver, not elsewhere classified: Secondary | ICD-10-CM | POA: Diagnosis present

## 2020-02-11 NOTE — Consult Note (Signed)
Cardiology Consult:   Patient ID: Leslie Alexander MRN: [id number]; DOB: 15-Apr-1941   Admission date: 02/21/2020  Primary Care Provider: Caryl Bis, MD Primary Cardiologist: Kate Sable, MD  Primary Electrophysiologist:  None   Chief Complaint:  SOB and edema  Patient Profile:   Leslie Alexander is a 79 y.o. female with h/o moderate pulm HTN and associated R heart dysfunction (mildly reduced RV systolic function on TTE done 02-07-20), mild non-obst CAD, normal LV systolic function on TTE 1-88-41, OSA, HTN, chronic/persistent afib, morbid obesity, stage 3 CKD transferred from [facility] due to SOB and edema.    History of Present Illness:   Notes indicate she presented to [facility] on 02-01-20 w/ worsening SOB and edema. CXR showed pulm edema, and pt was diuresed with IV lasix. 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 Per discharge summary from [facility], pt has been told that she and her family "need to be thinking about end-of-life issues."  She was also treated during her stay at [facility] for Klebsiella UTI as well as possible PNA with Rocephin and Zithromax. Of note, she was found to have significantly elevated LFTs; her alk phos has been progressively rising and today was 655. D/C summary indicates pt "may need MRCP". She is s/p cholecystectomy in the past. Request was made to transfer here and pt was accepted by Dr. Harrington Challenger, but given the non-cardiac medical issues that are ongoing, she will be accepted by the hospitalist service and cardiology will see her in consult.     Past Medical History:  Diagnosis Date  . Atrial fibrillation ([place])   . CAD (coronary artery disease)    a. mild nonobstructive CAD by cath in 11/2014. b. repeat cath in 12/2018 showing minor CAD; noted to have  moderate pulmonary HTN  . Chronic combined systolic (congestive) and diastolic (congestive) heart failure (HCC)    a. EF reduced to 40-45% by echo in 12/2018  . Depression   . Essential hypertension, benign   . GERD (gastroesophageal reflux disease)   . History of breast cancer   . Hx of atrial fibrillation, no current medication    converted out of A. Fib with cardioversion  . Hypothyroidism   . Left bundle branch block    negative Lexiscan Myoview; EF 56%, 3/13   . Lumbar disc disease   . Meralgia paresthetica, right   . Mixed hyperlipidemia   . Morbid obesity ([place])   . Nonalcoholic fatty liver disease   . OSA (obstructive sleep apnea)    CPAP machine  . Pulmonary hypertension (HCC)    RVSP 55-60 mm mercury    Past Surgical History:  Procedure Laterality Date  . CATARACT EXTRACTION W/PHACO Left 12/18/2013   Procedure: CATARACT EXTRACTION PHACO AND INTRAOCULAR LENS PLACEMENT (IOC);  Surgeon: Tonny Branch, MD;  Location: [facility];  Service: Ophthalmology;  Laterality: Left;  CDE 12.70  . CATARACT EXTRACTION, BILATERAL  04/19/2008   Dr. Geoffry Paradise  . FEMORAL EXPLORATION  12/2003   RIGHT LATERAL FEMORAL CUTANEOUS NERVE/Dr. Carloyn Manner  . HEEL SPUR SURGERY  07/2006   For spurs/Dr. Irving Shows  . INCISION AND DRAINAGE OF WOUND  01/26/2012   Dr. Alphonzo Cruise  . KNEE ARTHROSCOPY  10/2005   Left knee/from torn cartilage Dr. Garfield Cornea  . LAPAROSCOPIC CHOLECYSTECTOMY  03/02/2011   Dr. Anthony Sar  . LEFT HEART CATHETERIZATION  WITH CORONARY ANGIOGRAM N/A 12/20/2014   Procedure: LEFT HEART CATHETERIZATION WITH CORONARY ANGIOGRAM;  Surgeon: Peter M Martinique, MD;  Location: [facility] CATH LAB;  Service: Cardiovascular;  Laterality: N/A;  . MASTECTOMY, RADICAL Left 1989   With chemotherapy  . PARS PLANA VITRECTOMY W/ REPAIR OF MACULAR HOLE  05/2008   Dr. Zadie Rhine  . RIGHT/LEFT HEART CATH AND CORONARY ANGIOGRAPHY N/A 01/18/2019   Procedure: RIGHT/LEFT HEART CATH AND CORONARY ANGIOGRAPHY;  Surgeon: Martinique, Peter M, MD;  Location:  [place] CV LAB;  Service: Cardiovascular;  Laterality: N/A;  . TOTAL KNEE ARTHROPLASTY Left 12/29/2011   Dr. Alphonzo Cruise  . TUBAL LIGATION     BILATERAL     Medications Prior to Admission: Prior to Admission medications   Medication Sig Start Date End Date Taking? Authorizing Provider  acetaminophen (TYLENOL) 325 MG tablet Take 650 mg by mouth 3 (three) times daily.    [provider]  allopurinol (ZYLOPRIM) 300 MG tablet Take 300 mg daily by mouth. 09/16/17   [provider]  apixaban (ELIQUIS) 5 MG TABS tablet Take 1 tablet (5 mg total) by mouth 2 (two) times daily. 01/08/16   Herminio Commons, MD  atorvastatin (LIPITOR) 20 MG tablet Take 1 tablet (20 mg total) by mouth daily at 6 PM. 01/20/19   Dessa Phi, DO  carbidopa-levodopa (SINEMET IR) 10-100 MG tablet Take 1 tablet by mouth 2 (two) times daily.    [provider]  DULoxetine (CYMBALTA) 60 MG capsule Take 60 mg by mouth daily.  07/13/18   [provider]  fexofenadine (ALLEGRA) 180 MG tablet Take 180 mg by mouth daily.    [provider]  gabapentin (NEURONTIN) 300 MG capsule Take 300 mg by mouth 3 (three) times daily.    [provider]  levothyroxine (SYNTHROID, LEVOTHROID) 100 MCG tablet Take 100 mcg by mouth daily.    [provider]  losartan (COZAAR) 25 MG tablet Take 25 mg by mouth daily.    [provider]  Multiple Vitamins-Minerals (MULTIVITAMINS THER. W/MINERALS) TABS tablet Take 1 tablet by mouth daily.    [provider]  multivitamin-lutein (OCUVITE-LUTEIN) CAPS capsule Take 1 capsule by mouth daily.    [provider]  omeprazole (PRILOSEC) 20 MG capsule Take 20 mg by mouth daily.    [provider]  polyethylene glycol powder (GLYCOLAX/MIRALAX) powder Take 17 g daily as needed by mouth. 09/16/17   [provider]  potassium chloride SA (K-DUR,KLOR-CON) 20 MEQ tablet Take 1 tablet (20 mEq total) by mouth  daily. 01/20/19   Dessa Phi, DO  primidone (MYSOLINE) 50 MG tablet Take 100 mg by mouth 3 (three) times daily.     [provider]  torsemide (DEMADEX) 20 MG tablet Take 2 tablets (40 mg total) by mouth 2 (two) times daily for 30 days. 01/20/19 07/10/19  Dessa Phi, DO  traZODone (DESYREL) 50 MG tablet Take 50 mg by mouth at bedtime.  07/13/18   [provider]     Allergies:    Allergies  Allergen Reactions  . Codeine Palpitations  . Latex   . Ace Inhibitors Cough    Social History:   Social History   Socioeconomic History  . Marital status: Widowed    Spouse name: Not on file  . Number of children: 1  . Years of education: 47  . Highest education level: High school graduate  Occupational History  . Occupation: retired Systems analyst  Tobacco Use  . Smoking status: Former  Smoker    Packs/day: 0.50    Years: 10.00    Pack years: 5.00    Types: Cigarettes    Start date: 06/03/1965    Quit date: 11/24/1987    Years since quitting: 32.2  . Smokeless tobacco: Never Used  Substance and Sexual Activity  . Alcohol use: No    Alcohol/week: 0.0 standard drinks  . Drug use: No  . Sexual activity: Never  Other Topics Concern  . Not on file  Social History Narrative   Has 1 daughter that lives with her in a one story home.  Retired Automotive engineer.  Education: high school.    Social Determinants of Health   Financial Resource Strain:   . Difficulty of Paying Living Expenses:   Food Insecurity:   . Worried About Charity fundraiser in the Last Year:   . Arboriculturist in the Last Year:   Transportation Needs:   . Film/video editor (Medical):   Marland Kitchen Lack of Transportation (Non-Medical):   Physical Activity:   . Days of Exercise per Week:   . Minutes of Exercise per Session:   Stress:   . Feeling of Stress :   Social Connections:   . Frequency of Communication with Friends and Family:   . Frequency of Social Gatherings with Friends and  Family:   . Attends Religious Services:   . Active Member of Clubs or Organizations:   . Attends Archivist Meetings:   Marland Kitchen Marital Status:   Intimate Partner Violence:   . Fear of Current or Ex-Partner:   . Emotionally Abused:   Marland Kitchen Physically Abused:   . Sexually Abused:     Family History:   The patient's family history includes Breast cancer in her sister; COPD in her mother; Other in her father.    ROS:  Please see the history of present illness.  All other ROS reviewed and negative.     Physical Exam/Data:   Vitals:   01/24/2020 2035  BP: 103/77  Pulse: (!) 52  Resp: 17  Temp: 98.5 F (36.9 C)  TempSrc: Oral  SpO2: 93%   No intake or output data in the 24 hours ending 02/05/2020 2119 Last 3 Weights 07/10/2019 04/24/2019 04/07/2019  Weight (lbs) 248 lb 245 lb 244 lb  Weight (kg) 112.492 kg 111.131 kg 110.678 kg     There is no height or weight on file to calculate BMI.  General:  Well nourished, well developed, in no acute distress HEENT: normal Lymph: no adenopathy Neck: thick neck but pt appears to have JVD to angle of mandible lying relatively flat Endocrine:  No thryomegaly Vascular: No carotid bruits; DP pulses not palpable Cardiac:  normal S1, S2; irreg irreg; no murmur  Lungs:  clear to auscultation bilaterally, no wheezing, rhonchi or rales  Abd: obese, soft, but tender to palpation, no hepatomegaly  Ext: 1-2+ bilateral edema Musculoskeletal:  No deformities, BUE and BLE strength normal and equal Skin: warm and dry  Neuro:  CNs 2-12 intact, no focal abnormalities noted Psych:  Normal affect    EKG:  Pending; no EKG tracing sent from [facility]  Relevant CV Studies: TTE 02-08-20 1. The left ventricle is normal in size with normal wall thickness.  2. The left ventricular systolic function is normal, LVEF is visually  estimated at > 55%.  3. The mitral valve leaflets are mildly thickened with normal leaflet  mobility.  4. Mitral annular  calcification is present.  5. There is moderate mitral valve regurgitation.  6. The aortic valve is trileaflet with mildly thickened leaflets with normal  excursion.  7. There is mild aortic regurgitation.  8. The left atrium is severely dilated in size.  9. The right ventricle is mildly to moderately dilated in size, with mildly  reduced systolic function.  10. There is moderate tricuspid regurgitation.  11. There is moderate pulmonary hypertension, estimated pulmonary artery  systolic pressure is 56 mmHg.  12. The right atrium is severely dilated in size.   01-18-19 R/L HC  LV end diastolic pressure is mildly elevated.  Hemodynamic findings consistent with moderate pulmonary hypertension.   1. Minor nonobstructive CAD 2. Mildly elevated LV filling pressures 3. Moderate pulmonary HTN. 4. Cardiac index 2.66.   Laboratory Data:  High Sensitivity Troponin:  No results for input(s): TROPONINIHS in the last 720 hours.    ChemistryNo results for input(s): NA, K, CL, CO2, GLUCOSE, BUN, CREATININE, CALCIUM, GFRNONAA, GFRAA, ANIONGAP in the last 168 hours.  No results for input(s): PROT, ALBUMIN, AST, ALT, ALKPHOS, BILITOT in the last 168 hours. HematologyNo results for input(s): WBC, RBC, HGB, HCT, MCV, MCH, MCHC, RDW, PLT in the last 168 hours. BNPNo results for input(s): BNP, PROBNP in the last 168 hours.  DDimer No results for input(s): DDIMER in the last 168 hours.  Labs from [facility] 01/30/2020 Na 130, K 5.3, BUN 40, Cr 1.63 ALT 55, AST 85, AP 655, T bili 1.1  Radiology/Studies:  No results found.     Assessment and Plan:   1. pulm HTN/RV dysfunction/HFpEF: mild RV systolic dysfunction noted on TTE 02-07-20. Preserved LVEF >55%. Would recommend advanced HF consult for further recommendations on possible options to treat pt's pulm HTN and RV dysfunction. Pt's liver enzymes are elevated, which can be associated with hepatic congestion, but the extreme  elevation of the alkaline phosphatase is concerning for possible underlying primary liver/biliary pathologic process. Will defer further workup to the medicine team. Volume status is difficult to assess and there are likely multiple contributing issues. Pt has CKD with recent UTI, OSA, pulm HTN (probably related to OSA and non-compliance w/ home CPAP) and obesity hypoventilation; _0 , pt has alternately been diuresed with IV lasix, then given NS IVF for hyponatremia. She is probably total-body volume up. Would try a dose of IV lasix (can try 72m) and see how she responds clinically and from lab standpoint to help guide direction from there.  2. Afib: chronic/persistent, has been rate-controlled @ OSH on regimen toprol XL 561mbid. D/C summary indicates pt was on eliquis for CVA prophylaxis although not listed in the medications. Would cont eliquis unless otherwise contraindicated.  3. CAD: minimal non-obst CAD on cath Feb 2020.  4. HTN: pt requiring only toprol XL as single agent at OSH. Can continue that and up-titrate or add additional agents as needed  5. Dyslipidemia: cont lipitor 2036maily  6. OSA: cont CPAP with settings as per RT recommendations   For questions or updates, please contact [facility] consult [email or web address] for contact info under      Signed, SteRudean CurtD  02/15/2020 9:19 PM

## 2020-02-11 NOTE — Progress Notes (Signed)
Called back [place], Mrs. Myer's daughter and left her VM advising that Ms. Clippard was transfer to [facility] hospital around 2030 and is well.  Any concerns, she can give me a call back.

## 2020-02-12 ENCOUNTER — Inpatient Hospital Stay (HOSPITAL_COMMUNITY): Payer: Medicare PPO

## 2020-02-12 ENCOUNTER — Encounter (HOSPITAL_COMMUNITY): Payer: Self-pay | Admitting: Internal Medicine

## 2020-02-12 DIAGNOSIS — I361 Nonrheumatic tricuspid (valve) insufficiency: Secondary | ICD-10-CM | POA: Diagnosis not present

## 2020-02-12 DIAGNOSIS — J9601 Acute respiratory failure with hypoxia: Secondary | ICD-10-CM | POA: Diagnosis not present

## 2020-02-12 DIAGNOSIS — I480 Paroxysmal atrial fibrillation: Secondary | ICD-10-CM | POA: Diagnosis not present

## 2020-02-12 DIAGNOSIS — R7989 Other specified abnormal findings of blood chemistry: Secondary | ICD-10-CM | POA: Diagnosis not present

## 2020-02-12 DIAGNOSIS — R570 Cardiogenic shock: Secondary | ICD-10-CM

## 2020-02-12 DIAGNOSIS — I340 Nonrheumatic mitral (valve) insufficiency: Secondary | ICD-10-CM

## 2020-02-12 DIAGNOSIS — N179 Acute kidney failure, unspecified: Secondary | ICD-10-CM

## 2020-02-12 DIAGNOSIS — L8990 Pressure ulcer of unspecified site, unspecified stage: Secondary | ICD-10-CM | POA: Diagnosis present

## 2020-02-12 DIAGNOSIS — I5043 Acute on chronic combined systolic (congestive) and diastolic (congestive) heart failure: Secondary | ICD-10-CM

## 2020-02-12 LAB — COMPREHENSIVE METABOLIC PANEL
ALT: 190 U/L — ABNORMAL HIGH (ref 0–44)
ALT: 189 U/L — ABNORMAL HIGH (ref 0–44)
ALT: 169 U/L — ABNORMAL HIGH (ref 0–44)
AST: 232 U/L — ABNORMAL HIGH (ref 15–41)
AST: 278 U/L — ABNORMAL HIGH (ref 15–41)
AST: 246 U/L — ABNORMAL HIGH (ref 15–41)
Albumin: 2.6 g/dL — ABNORMAL LOW (ref 3.5–5.0)
Albumin: 2.4 g/dL — ABNORMAL LOW (ref 3.5–5.0)
Albumin: 2.4 g/dL — ABNORMAL LOW (ref 3.5–5.0)
Alkaline Phosphatase: 634 U/L — ABNORMAL HIGH (ref 38–126)
Alkaline Phosphatase: 543 U/L — ABNORMAL HIGH (ref 38–126)
Alkaline Phosphatase: 478 U/L — ABNORMAL HIGH (ref 38–126)
Anion gap: 21 — ABNORMAL HIGH (ref 5–15)
Anion gap: 15 (ref 5–15)
Anion gap: 11 (ref 5–15)
BUN: 58 mg/dL — ABNORMAL HIGH (ref 8–23)
BUN: 61 mg/dL — ABNORMAL HIGH (ref 8–23)
BUN: 66 mg/dL — ABNORMAL HIGH (ref 8–23)
CO2: 13 mmol/L — ABNORMAL LOW (ref 22–32)
CO2: 17 mmol/L — ABNORMAL LOW (ref 22–32)
CO2: 21 mmol/L — ABNORMAL LOW (ref 22–32)
Calcium: 9.2 mg/dL (ref 8.9–10.3)
Calcium: 8.8 mg/dL — ABNORMAL LOW (ref 8.9–10.3)
Calcium: 8.5 mg/dL — ABNORMAL LOW (ref 8.9–10.3)
Chloride: 98 mmol/L (ref 98–111)
Chloride: 101 mmol/L (ref 98–111)
Chloride: 101 mmol/L (ref 98–111)
Creatinine, Ser: 2.9 mg/dL — ABNORMAL HIGH (ref 0.44–1.00)
Creatinine, Ser: 2.76 mg/dL — ABNORMAL HIGH (ref 0.44–1.00)
Creatinine, Ser: 2.57 mg/dL — ABNORMAL HIGH (ref 0.44–1.00)
GFR calc Af Amer: 17 mL/min — ABNORMAL LOW (ref >60)
GFR calc Af Amer: 19 mL/min — ABNORMAL LOW (ref >60)
GFR calc Af Amer: 20 mL/min — ABNORMAL LOW (ref >60)
GFR calc non Af Amer: 15 mL/min — ABNORMAL LOW (ref >60)
GFR calc non Af Amer: 16 mL/min — ABNORMAL LOW (ref >60)
GFR calc non Af Amer: 17 mL/min — ABNORMAL LOW (ref >60)
Glucose, Bld: 111 mg/dL — ABNORMAL HIGH (ref 70–99)
Glucose, Bld: 152 mg/dL — ABNORMAL HIGH (ref 70–99)
Glucose, Bld: 165 mg/dL — ABNORMAL HIGH (ref 70–99)
Potassium: 6.1 mmol/L — ABNORMAL HIGH (ref 3.5–5.1)
Potassium: 5.6 mmol/L — ABNORMAL HIGH (ref 3.5–5.1)
Potassium: 4.4 mmol/L (ref 3.5–5.1)
Sodium: 132 mmol/L — ABNORMAL LOW (ref 135–145)
Sodium: 133 mmol/L — ABNORMAL LOW (ref 135–145)
Sodium: 133 mmol/L — ABNORMAL LOW (ref 135–145)
Total Bilirubin: 2.8 mg/dL — ABNORMAL HIGH (ref 0.3–1.2)
Total Bilirubin: 2.5 mg/dL — ABNORMAL HIGH (ref 0.3–1.2)
Total Bilirubin: 1.6 mg/dL — ABNORMAL HIGH (ref 0.3–1.2)
Total Protein: 6.4 g/dL — ABNORMAL LOW (ref 6.5–8.1)
Total Protein: 5.9 g/dL — ABNORMAL LOW (ref 6.5–8.1)
Total Protein: 5.8 g/dL — ABNORMAL LOW (ref 6.5–8.1)

## 2020-02-12 LAB — BLOOD GAS, ARTERIAL
Acid-base deficit: 15.1 mmol/L — ABNORMAL HIGH (ref 0.0–2.0)
Bicarbonate: 10.5 mmol/L — ABNORMAL LOW (ref 20.0–28.0)
FIO2: 44
O2 Saturation: 91.8 %
Patient temperature: 36.4
pCO2 arterial: 23.5 mmHg — ABNORMAL LOW (ref 32.0–48.0)
pH, Arterial: 7.271 — ABNORMAL LOW (ref 7.350–7.450)
pO2, Arterial: 84.2 mmHg (ref 83.0–108.0)

## 2020-02-12 LAB — CBC WITH DIFFERENTIAL/PLATELET
Abs Immature Granulocytes: 0.21 10*3/uL — ABNORMAL HIGH (ref 0.00–0.07)
Basophils Absolute: 0 10*3/uL (ref 0.0–0.1)
Basophils Relative: 0 %
Eosinophils Absolute: 0 10*3/uL (ref 0.0–0.5)
Eosinophils Relative: 0 %
HCT: 45.3 % (ref 36.0–46.0)
Hemoglobin: 14.1 g/dL (ref 12.0–15.0)
Immature Granulocytes: 1 %
Lymphocytes Relative: 7 %
Lymphs Abs: 1.3 10*3/uL (ref 0.7–4.0)
MCH: 30.3 pg (ref 26.0–34.0)
MCHC: 31.1 g/dL (ref 30.0–36.0)
MCV: 97.4 fL (ref 80.0–100.0)
Monocytes Absolute: 1.3 10*3/uL — ABNORMAL HIGH (ref 0.1–1.0)
Monocytes Relative: 7 %
Neutro Abs: 16.1 10*3/uL — ABNORMAL HIGH (ref 1.7–7.7)
Neutrophils Relative %: 85 %
Platelets: 254 10*3/uL (ref 150–400)
RBC: 4.65 MIL/uL (ref 3.87–5.11)
RDW: 17.7 % — ABNORMAL HIGH (ref 11.5–15.5)
WBC: 18.9 10*3/uL — ABNORMAL HIGH (ref 4.0–10.5)
nRBC: 0.8 % — ABNORMAL HIGH (ref 0.0–0.2)

## 2020-02-12 LAB — CBC
HCT: 42.4 % (ref 36.0–46.0)
Hemoglobin: 12.9 g/dL (ref 12.0–15.0)
MCH: 29.9 pg (ref 26.0–34.0)
MCHC: 30.4 g/dL (ref 30.0–36.0)
MCV: 98.1 fL (ref 80.0–100.0)
Platelets: 258 10*3/uL (ref 150–400)
RBC: 4.32 MIL/uL (ref 3.87–5.11)
RDW: 17.8 % — ABNORMAL HIGH (ref 11.5–15.5)
WBC: 17.6 10*3/uL — ABNORMAL HIGH (ref 4.0–10.5)
nRBC: 0.5 % — ABNORMAL HIGH (ref 0.0–0.2)

## 2020-02-12 LAB — BASIC METABOLIC PANEL
Anion gap: 22 — ABNORMAL HIGH (ref 5–15)
Anion gap: 11 (ref 5–15)
BUN: 52 mg/dL — ABNORMAL HIGH (ref 8–23)
BUN: 65 mg/dL — ABNORMAL HIGH (ref 8–23)
CO2: 10 mmol/L — ABNORMAL LOW (ref 22–32)
CO2: 20 mmol/L — ABNORMAL LOW (ref 22–32)
Calcium: 9.5 mg/dL (ref 8.9–10.3)
Calcium: 8.6 mg/dL — ABNORMAL LOW (ref 8.9–10.3)
Chloride: 100 mmol/L (ref 98–111)
Chloride: 101 mmol/L (ref 98–111)
Creatinine, Ser: 2.36 mg/dL — ABNORMAL HIGH (ref 0.44–1.00)
Creatinine, Ser: 2.59 mg/dL — ABNORMAL HIGH (ref 0.44–1.00)
GFR calc Af Amer: 22 mL/min — ABNORMAL LOW (ref >60)
GFR calc Af Amer: 20 mL/min — ABNORMAL LOW (ref >60)
GFR calc non Af Amer: 19 mL/min — ABNORMAL LOW (ref >60)
GFR calc non Af Amer: 17 mL/min — ABNORMAL LOW (ref >60)
Glucose, Bld: 66 mg/dL — ABNORMAL LOW (ref 70–99)
Glucose, Bld: 170 mg/dL — ABNORMAL HIGH (ref 70–99)
Potassium: 6.4 mmol/L (ref 3.5–5.1)
Potassium: 4.8 mmol/L (ref 3.5–5.1)
Sodium: 132 mmol/L — ABNORMAL LOW (ref 135–145)
Sodium: 132 mmol/L — ABNORMAL LOW (ref 135–145)

## 2020-02-12 LAB — ABO/RH: ABO/RH(D): A NEG

## 2020-02-12 LAB — URINALYSIS, ROUTINE W REFLEX MICROSCOPIC
Bilirubin Urine: NEGATIVE
Glucose, UA: NEGATIVE mg/dL
Ketones, ur: NEGATIVE mg/dL
Nitrite: NEGATIVE
Protein, ur: 100 mg/dL — AB
RBC / HPF: >50 RBC/hpf — ABNORMAL HIGH (ref 0–5)
Specific Gravity, Urine: 1.024 (ref 1.005–1.030)
pH: 5 (ref 5.0–8.0)

## 2020-02-12 LAB — SARS CORONAVIRUS 2 (TAT 6-24 HRS): SARS Coronavirus 2: NEGATIVE

## 2020-02-12 LAB — POCT I-STAT 7, (LYTES, BLD GAS, ICA,H+H)
Acid-base deficit: 9 mmol/L — ABNORMAL HIGH (ref 0.0–2.0)
Bicarbonate: 14.4 mmol/L — ABNORMAL LOW (ref 20.0–28.0)
Calcium, Ion: 1.05 mmol/L — ABNORMAL LOW (ref 1.15–1.40)
HCT: 37 % (ref 36.0–46.0)
Hemoglobin: 12.6 g/dL (ref 12.0–15.0)
O2 Saturation: 83 %
Potassium: 5.6 mmol/L — ABNORMAL HIGH (ref 3.5–5.1)
Sodium: 129 mmol/L — ABNORMAL LOW (ref 135–145)
TCO2: 15 mmol/L — ABNORMAL LOW (ref 22–32)
pCO2 arterial: 25 mmHg — ABNORMAL LOW (ref 32.0–48.0)
pH, Arterial: 7.369 (ref 7.350–7.450)
pO2, Arterial: 47 mmHg — ABNORMAL LOW (ref 83.0–108.0)

## 2020-02-12 LAB — GLUCOSE, CAPILLARY
Glucose-Capillary: 31 mg/dL — CL (ref 70–99)
Glucose-Capillary: 104 mg/dL — ABNORMAL HIGH (ref 70–99)
Glucose-Capillary: 113 mg/dL — ABNORMAL HIGH (ref 70–99)
Glucose-Capillary: 118 mg/dL — ABNORMAL HIGH (ref 70–99)
Glucose-Capillary: 143 mg/dL — ABNORMAL HIGH (ref 70–99)
Glucose-Capillary: 133 mg/dL — ABNORMAL HIGH (ref 70–99)
Glucose-Capillary: 139 mg/dL — ABNORMAL HIGH (ref 70–99)
Glucose-Capillary: 144 mg/dL — ABNORMAL HIGH (ref 70–99)

## 2020-02-12 LAB — LACTIC ACID, PLASMA
Lactic Acid, Venous: 8.9 mmol/L (ref 0.5–1.9)
Lactic Acid, Venous: 9.2 mmol/L (ref 0.5–1.9)
Lactic Acid, Venous: 7 mmol/L (ref 0.5–1.9)
Lactic Acid, Venous: 2.7 mmol/L (ref 0.5–1.9)

## 2020-02-12 LAB — TSH: TSH: 0.891 u[IU]/mL (ref 0.350–4.500)

## 2020-02-12 LAB — TYPE AND SCREEN
ABO/RH(D): A NEG
Antibody Screen: NEGATIVE

## 2020-02-12 LAB — MAGNESIUM
Magnesium: 2.1 mg/dL (ref 1.7–2.4)
Magnesium: 2 mg/dL (ref 1.7–2.4)

## 2020-02-12 LAB — HEPATIC FUNCTION PANEL
ALT: 150 U/L — ABNORMAL HIGH (ref 0–44)
AST: 182 U/L — ABNORMAL HIGH (ref 15–41)
Albumin: 2.8 g/dL — ABNORMAL LOW (ref 3.5–5.0)
Alkaline Phosphatase: 653 U/L — ABNORMAL HIGH (ref 38–126)
Bilirubin, Direct: 1.8 mg/dL — ABNORMAL HIGH (ref 0.0–0.2)
Indirect Bilirubin: 1 mg/dL — ABNORMAL HIGH (ref 0.3–0.9)
Total Bilirubin: 2.8 mg/dL — ABNORMAL HIGH (ref 0.3–1.2)
Total Protein: 7 g/dL (ref 6.5–8.1)

## 2020-02-12 LAB — PROTIME-INR
INR: 3.8 — ABNORMAL HIGH (ref 0.8–1.2)
INR: 5 (ref 0.8–1.2)
Prothrombin Time: 37.5 seconds — ABNORMAL HIGH (ref 11.4–15.2)
Prothrombin Time: 46.5 seconds — ABNORMAL HIGH (ref 11.4–15.2)

## 2020-02-12 LAB — TROPONIN I (HIGH SENSITIVITY)
Troponin I (High Sensitivity): 15 ng/L (ref <18)
Troponin I (High Sensitivity): 15 ng/L (ref <18)
Troponin I (High Sensitivity): 15 ng/L (ref <18)

## 2020-02-12 LAB — PHOSPHORUS: Phosphorus: 6.9 mg/dL — ABNORMAL HIGH (ref 2.5–4.6)

## 2020-02-12 LAB — PROCALCITONIN: Procalcitonin: 0.58 ng/mL

## 2020-02-12 LAB — GAMMA GT: GGT: 778 U/L — ABNORMAL HIGH (ref 7–50)

## 2020-02-12 LAB — BRAIN NATRIURETIC PEPTIDE: B Natriuretic Peptide: 1448.7 pg/mL — ABNORMAL HIGH (ref 0.0–100.0)

## 2020-02-12 LAB — PROTEIN / CREATININE RATIO, URINE
Creatinine, Urine: 43.63 mg/dL
Protein Creatinine Ratio: 0.3 mg/mg{Cre} — ABNORMAL HIGH (ref 0.00–0.15)
Total Protein, Urine: 13 mg/dL

## 2020-02-12 LAB — LIPASE, BLOOD: Lipase: 35 U/L (ref 11–51)

## 2020-02-12 MED ORDER — FUROSEMIDE 10 MG/ML IJ SOLN
120.0000 mg | Freq: Once | INTRAVENOUS | Status: AC
  Administered 2020-02-12: 120 mg via INTRAVENOUS
  Filled 2020-02-12: qty 12

## 2020-02-12 MED ORDER — ALLOPURINOL 300 MG PO TABS: 300.0000 mg | ORAL_TABLET | Freq: Every day | ORAL | Status: DC

## 2020-02-12 MED ORDER — CALCIUM GLUCONATE-NACL 1-0.675 GM/50ML-% IV SOLN
1.0000 g | Freq: Once | INTRAVENOUS | Status: AC
  Administered 2020-02-12: 1000 mg via INTRAVENOUS
  Filled 2020-02-12: qty 50

## 2020-02-12 MED ORDER — ACETAMINOPHEN 10 MG/ML IV SOLN
1000.0000 mg | Freq: Four times a day (QID) | INTRAVENOUS | Status: DC | PRN
  Filled 2020-02-12: qty 100

## 2020-02-12 MED ORDER — CARBIDOPA-LEVODOPA 10-100 MG PO TABS
1.0000 | ORAL_TABLET | Freq: Two times a day (BID) | ORAL | Status: DC
  Administered 2020-02-13 – 2020-02-15 (×5): 1 via ORAL
  Filled 2020-02-12 (×10): qty 1

## 2020-02-12 MED ORDER — VANCOMYCIN VARIABLE DOSE PER UNSTABLE RENAL FUNCTION (PHARMACIST DOSING): Status: DC

## 2020-02-12 MED ORDER — DEXTROSE 50 % IV SOLN
INTRAVENOUS | Status: AC
  Administered 2020-02-12: 50 mL
  Filled 2020-02-12: qty 50

## 2020-02-12 MED ORDER — SODIUM CHLORIDE 0.9 % IV SOLN: 2.0000 g | Freq: Three times a day (TID) | INTRAVENOUS | Status: DC

## 2020-02-12 MED ORDER — DOBUTAMINE IN D5W 4-5 MG/ML-% IV SOLN
2.5000 ug/kg/min | INTRAVENOUS | Status: DC
  Administered 2020-02-12 – 2020-02-14 (×2): 2.5 ug/kg/min via INTRAVENOUS
  Filled 2020-02-12 (×2): qty 250

## 2020-02-12 MED ORDER — SODIUM CHLORIDE 0.9 % IV SOLN
1.0000 g | INTRAVENOUS | Status: DC
  Administered 2020-02-13: 1 g via INTRAVENOUS
  Filled 2020-02-12 (×4): qty 1

## 2020-02-12 MED ORDER — SODIUM CHLORIDE 0.9% IV SOLUTION: Freq: Once | INTRAVENOUS | Status: AC

## 2020-02-12 MED ORDER — DULOXETINE HCL 30 MG PO CPEP
60.0000 mg | ORAL_CAPSULE | Freq: Every day | ORAL | Status: DC
  Administered 2020-02-13 – 2020-02-15 (×3): 60 mg via ORAL
  Filled 2020-02-12 (×4): qty 2

## 2020-02-12 MED ORDER — GABAPENTIN 100 MG PO CAPS: 100.0000 mg | ORAL_CAPSULE | Freq: Three times a day (TID) | ORAL | Status: DC

## 2020-02-12 MED ORDER — SODIUM ZIRCONIUM CYCLOSILICATE 10 G PO PACK
10.0000 g | PACK | Freq: Once | ORAL | Status: AC
  Administered 2020-02-12: 10 g via ORAL
  Filled 2020-02-12: qty 1

## 2020-02-12 MED ORDER — TRAZODONE HCL 50 MG PO TABS: 50.0000 mg | ORAL_TABLET | Freq: Every evening | ORAL | Status: DC | PRN

## 2020-02-12 MED ORDER — PHYTONADIONE 5 MG PO TABS
10.0000 mg | ORAL_TABLET | Freq: Once | ORAL | Status: DC
  Filled 2020-02-12: qty 2

## 2020-02-12 MED ORDER — FUROSEMIDE 10 MG/ML IJ SOLN
120.0000 mg | Freq: Once | INTRAVENOUS | Status: AC
  Administered 2020-02-12: 120 mg via INTRAVENOUS
  Filled 2020-02-12: qty 10

## 2020-02-12 MED ORDER — CHLORHEXIDINE GLUCONATE CLOTH 2 % EX PADS
6.0000 | MEDICATED_PAD | Freq: Every day | CUTANEOUS | Status: DC
  Administered 2020-02-12 – 2020-02-14 (×4): 6 via TOPICAL

## 2020-02-12 MED ORDER — ACETAMINOPHEN 325 MG PO TABS: 650.0000 mg | ORAL_TABLET | Freq: Four times a day (QID) | ORAL | Status: DC | PRN

## 2020-02-12 MED ORDER — SODIUM ZIRCONIUM CYCLOSILICATE 5 G PO PACK
10.0000 g | PACK | Freq: Once | ORAL | Status: AC
  Administered 2020-02-12: 10 g via ORAL
  Filled 2020-02-12: qty 2

## 2020-02-12 MED ORDER — ALLOPURINOL 100 MG PO TABS
100.0000 mg | ORAL_TABLET | Freq: Every day | ORAL | Status: DC
  Administered 2020-02-13 – 2020-02-15 (×3): 100 mg via ORAL
  Filled 2020-02-12 (×5): qty 1

## 2020-02-12 MED ORDER — CHLORHEXIDINE GLUCONATE 0.12 % MT SOLN
15.0000 mL | Freq: Two times a day (BID) | OROMUCOSAL | Status: DC
  Administered 2020-02-12 – 2020-02-15 (×6): 15 mL via OROMUCOSAL
  Filled 2020-02-12 (×4): qty 15

## 2020-02-12 MED ORDER — FENTANYL CITRATE (PF) 100 MCG/2ML IJ SOLN: 6.2500 ug | Freq: Four times a day (QID) | INTRAMUSCULAR | Status: DC | PRN

## 2020-02-12 MED ORDER — FUROSEMIDE 10 MG/ML IJ SOLN: 80.0000 mg | Freq: Once | INTRAMUSCULAR | Status: DC

## 2020-02-12 MED ORDER — TRAZODONE HCL 50 MG PO TABS: 50.0000 mg | ORAL_TABLET | Freq: Every day | ORAL | Status: DC

## 2020-02-12 MED ORDER — FUROSEMIDE 10 MG/ML IJ SOLN
80.0000 mg | Freq: Once | INTRAMUSCULAR | Status: AC
  Administered 2020-02-12: 80 mg via INTRAVENOUS
  Filled 2020-02-12: qty 8

## 2020-02-12 MED ORDER — ACETAMINOPHEN 650 MG RE SUPP: 650.0000 mg | Freq: Four times a day (QID) | RECTAL | Status: DC | PRN

## 2020-02-12 MED ORDER — ORAL CARE MOUTH RINSE
15.0000 mL | Freq: Two times a day (BID) | OROMUCOSAL | Status: DC
  Administered 2020-02-12 – 2020-02-15 (×6): 15 mL via OROMUCOSAL

## 2020-02-12 MED ORDER — FENTANYL CITRATE (PF) 100 MCG/2ML IJ SOLN
12.5000 ug | Freq: Once | INTRAMUSCULAR | Status: AC
  Administered 2020-02-12: 12.5 ug via INTRAVENOUS
  Filled 2020-02-12: qty 2

## 2020-02-12 MED ORDER — VANCOMYCIN HCL 2000 MG/400ML IV SOLN
2000.0000 mg | Freq: Once | INTRAVENOUS | Status: AC
  Administered 2020-02-12: 2000 mg via INTRAVENOUS
  Filled 2020-02-12: qty 400

## 2020-02-12 MED ORDER — LEVOTHYROXINE SODIUM 25 MCG PO TABS
100.0000 ug | ORAL_TABLET | Freq: Every day | ORAL | Status: DC
  Administered 2020-02-14 – 2020-02-15 (×2): 100 ug via ORAL
  Filled 2020-02-12 (×2): qty 4

## 2020-02-12 NOTE — H&P (Signed)
NAME:  Leslie Alexander, MRN:  [id number], DOB:  January 19, 1941, LOS: 1 ADMISSION DATE:  01/28/2020, CONSULTATION DATE:  02/12/2020 REFERRING MD:  Hospitalist, CHIEF COMPLAINT:  Lactic acidosis   Brief History   79 yo F with a history of pulmonary hypertension, diastolic heart failure, non-obstructive CAD, OSA, HTN, persistent Afib, obesity, and stage 3 CKD who presented to [facility] 02/01/20 for shortness of breath and swelling, thought to be due to heart failure.  She was also being treated for a klebsiella UTI and possible pneumonia with ceftriaxone and azithromycin.   Patient was initially diuresed aggressively with IV lasix, but then developed hyponatremia, and diuresis was stopped and she was given normal saline.  She was also having issues with hyperkalemia over there and was given kayexalate.   History of present illness   Family wanted her transferred to [facility].  Over at [facility], she was having elevated LFTs, particularly the alk phos.  This evening, patient had an episode of hypoglycemia, which improved with dextrose.  Her labs returned with significant abnormalities including a significant lactic acidosis with lactate of 9, bicarb of 10, hyperkalemia, and INR of 5.  When seen at the bedside, patient is lethargic but overall but alert and oriented, satting 97% on 6L.  She feels unwell, but not in any pain except for mild diffuse abdominal pain, which she attributes to not having a bowel movement. Denies any chest pain and thinks his breathing is "alright."  Past Medical History   Past Medical History:  Diagnosis Date  . Atrial fibrillation ([place])   . CAD (coronary artery disease)    a. mild nonobstructive CAD by cath in 11/2014. b. repeat cath in 12/2018 showing minor CAD; noted to have moderate pulmonary HTN  . Chronic combined systolic (congestive) and diastolic (congestive) heart failure (HCC)    a. EF reduced to 40-45% by echo in 12/2018  . Depression   . Essential hypertension,  benign   . GERD (gastroesophageal reflux disease)   . History of breast cancer   . Hx of atrial fibrillation, no current medication    converted out of A. Fib with cardioversion  . Hypothyroidism   . Left bundle branch block    negative Lexiscan Myoview; EF 56%, 3/13   . Lumbar disc disease   . Meralgia paresthetica, right   . Mixed hyperlipidemia   . Morbid obesity ([place])   . Nonalcoholic fatty liver disease   . OSA (obstructive sleep apnea)    CPAP machine  . Pulmonary hypertension (HCC)    RVSP 55-60 mm mercury     Significant Hospital Events   3/21: [facility]  3/22: Transferred to ICU for lactic acidosis  Consults:  Cardiology  Procedures:    Significant Diagnostic Tests:  RUQ doppler 3/9: patent portal vein, liver within normal limits.   CT A/P w/o contrast: Nonobstructive left nephrolithiasis, fat stranding around pancreas (per [facility], this is chronic)  BNP 3838 3/15 -->5965 3/21 Na: 3/15 125-->127-->120>123>125 (3/17)-->130 (3/18) --> 128 (3/20)--> 130 (3/21) Cr: 3/15 1.3-->1.58-->1.77-->1.56--> 0.9 --> 1.37 (3/20) -->1.63 (3/21)  Micro Data:  Urine culture 3/11: Klebsiella (pan-susceptible), 10,000 CFU  Blood cultures 3/11: negative   Antimicrobials:  S/p Ceftriaxone and azithromycin at OSH  Interim history/subjective:    Objective   Blood pressure 113/88, pulse 91, temperature 97.6 F (36.4 C), temperature source Axillary, resp. rate 18, SpO2 95 %.       No intake or output data in the 24 hours  ending 02/12/20 0433 There were no vitals filed for this visit.  Examination: General: Lethargic but alert and oriented.  No significant distress HENT: no abnormalities Lungs: crackles bilaterally, L>R Cardiovascular: irregular rhythm, rate controlled Abdomen: soft, mild tenderness diffusely, not distended Extremities: Cool extremities.  purpura, 3+ pitting edema in bilateral lower extremities, 2+ edema in bilateral  UEs.  Right sided PICC line Neuro: no focal deficits GU: No abnormalities.    Resolved Hospital Problem list     Assessment & Plan:   79 yo woman with a history of pulmonary hypertension, diastolic heart failure, non-obstructive CAD, OSA, HTN, persistent Afib, obesity, and stage 3 CKD being transferred to the MICU for metabolic acidosis (namely lactic acidosis), liver injury, AKI on CKD, concern for cardiogenic shock.    # Metabolic acidosis, lactic acidosis: Currently patient is compensating ok.  Bicarb 10, lactate 9, and ABG with pH of 7.27.  Placed on bipap to increase minute ventilation.  Suspect underlying cause could be cardiogenic shock causing a low-flow state, with prerenal AKI and hepatic congestion (patient is cool and wet) leading to liver dysfunction.   - follow blood gases, trend lactate - continue bipap for now- Patient is ok with intubation if need be - lasix- already ordered, will likely need inotropes.  If worsens with these measures, would need to re-consider shock etiology - cover with broad spectrum abx- start vancomycin and cefepime.  Blood cultures and U/A  # Shock, likely cardiogenic: Pulmonary edema on CXR, she is cold and wet on exam.  Signs of RV more than LV dysfunction on previous echo at [facility].   - TTE - trial lasix - consider starting dobutamine when she reaches the MICU (has PICC) - blood and urine cultures  # Acute hypoxic respiratory failure:  satting ok on 6L, now on bipap.  Likely from pulmonary edema  - diuresis as above - continue bipap - trend blood gases  # AKI on CKD, hyperkalemia:  Concern for cardiogenic shock - diuresis as above, with inotropic support - given calcium gluconate for stabilization - repeat metabolic panel  # Acute liver injury:  INR 5.  Alk phos elevating.  Mild elevation in transaminases - checking GGT - MRCP when able to (currently too unstable for this) - consider hepatology consult in the morning - vitamin K  oral 60m  # Hypoglycemia:  Likely due to liver injury, impending liver failure and shock state - frequent glucose checks, dextrose as needed  # Abdominal pain: KUB now to evaluate for ileus or perforation  Chronic issues: # Afib, on apixaban: stable, rate controlled currently.  Hold beta blockers, hold apixaban with INR>2 # Parkinson's: on levo-dopa, carbidopa # Hypothyroidism: TSH normal at 0.9, continue synthroid     Best practice:  Diet: NPO Pain/Anxiety/Delirium protocol (if indicated): n/a VAP protocol (if indicated): n/a DVT prophylaxis: hold while INR >2 GI prophylaxis: n/a Glucose control: glucose q2h for hypoglycemia Mobility: when able Code Status: DNR, ok for intubation Family Communication: Attempted to call daughter, could not reach her Disposition: 28M ICU  Labs   CBC: Recent Labs  Lab 02/15/2020 2335  WBC 18.9*  NEUTROABS 16.1*  HGB 14.1  HCT 45.3  MCV 97.4  PLT 2250   Basic Metabolic Panel: Recent Labs  Lab 02/01/2020 2335  NA 132*  K 6.4*  CL 100  CO2 10*  GLUCOSE 66*  BUN 52*  CREATININE 2.36*  CALCIUM 9.5  MG 2.1   GFR: CrCl cannot be calculated (Unknown ideal  weight.). Recent Labs  Lab 01/30/2020 2335 02/12/20 0251 02/12/20 0252  PROCALCITON  --   --  0.58  WBC 18.9*  --   --   LATICACIDVEN  --  8.9*  --     Liver Function Tests: Recent Labs  Lab 02/17/2020 2335  AST 182*  ALT 150*  ALKPHOS 653*  BILITOT 2.8*  PROT 7.0  ALBUMIN 2.8*   No results for input(s): LIPASE, AMYLASE in the last 168 hours. No results for input(s): AMMONIA in the last 168 hours.  ABG    Component Value Date/Time   PHART 7.456 (H) 01/18/2019 1033   PCO2ART 32.5 01/18/2019 1033   PO2ART 115.0 (H) 01/18/2019 1033   HCO3 22.9 01/18/2019 1033   TCO2 24 01/18/2019 1033   ACIDBASEDEF 1.0 01/18/2019 1033   O2SAT 99.0 01/18/2019 1033     Coagulation Profile: Recent Labs  Lab 02/02/2020 2335  INR 5.0*    Cardiac Enzymes: No results for  input(s): CKTOTAL, CKMB, CKMBINDEX, TROPONINI in the last 168 hours.  HbA1C: Hgb A1c MFr Bld  Date/Time Value Ref Range Status  12/19/2014 06:45 AM 6.5 (H) <5.7 % Final    Comment:    (NOTE)                                                                       According to the ADA Clinical Practice Recommendations for 2011, when HbA1c is used as a screening test:  >=6.5%   Diagnostic of Diabetes Mellitus           (if abnormal result is confirmed) 5.7-6.4%   Increased risk of developing Diabetes Mellitus References:Diagnosis and Classification of Diabetes Mellitus,Diabetes JOIT,2549,82(MEBRA 1):S62-S69 and Standards of Medical Care in         Diabetes - 2011,Diabetes XENM,0768,08 (Suppl 1):S11-S61.     CBG: Recent Labs  Lab 02/12/20 0207 02/12/20 0247  GLUCAP 31* 104*    Review of Systems:   All other ROS negative except per HPI  Past Medical History  She,  has a past medical history of Atrial fibrillation ([place]), CAD (coronary artery disease), Chronic combined systolic (congestive) and diastolic (congestive) heart failure ([place]), Depression, Essential hypertension, benign, GERD (gastroesophageal reflux disease), History of breast cancer, atrial fibrillation, no current medication, Hypothyroidism, Left bundle branch block, Lumbar disc disease, Meralgia paresthetica, right, Mixed hyperlipidemia, Morbid obesity ([place]), Nonalcoholic fatty liver disease, OSA (obstructive sleep apnea), and Pulmonary hypertension ([place]).   Surgical History    Past Surgical History:  Procedure Laterality Date  . CATARACT EXTRACTION W/PHACO Left 12/18/2013   Procedure: CATARACT EXTRACTION PHACO AND INTRAOCULAR LENS PLACEMENT (IOC);  Surgeon: Tonny Branch, MD;  Location: [facility];  Service: Ophthalmology;  Laterality: Left;  CDE 12.70  . CATARACT EXTRACTION, BILATERAL  04/19/2008   Dr. Geoffry Paradise  . FEMORAL EXPLORATION  12/2003   RIGHT LATERAL FEMORAL CUTANEOUS NERVE/Dr. Carloyn Manner  . HEEL SPUR SURGERY  07/2006   For  spurs/Dr. Irving Shows  . INCISION AND DRAINAGE OF WOUND  01/26/2012   Dr. Alphonzo Cruise  . KNEE ARTHROSCOPY  10/2005   Left knee/from torn cartilage Dr. Garfield Cornea  . LAPAROSCOPIC CHOLECYSTECTOMY  03/02/2011   Dr. Anthony Sar  . LEFT HEART CATHETERIZATION WITH CORONARY ANGIOGRAM N/A 12/20/2014   Procedure: LEFT HEART  CATHETERIZATION WITH CORONARY ANGIOGRAM;  Surgeon: Peter M Martinique, MD;  Location: [facility] C[facility] LAB;  Service: Cardiovascular;  Laterality: N/A;  . MASTECTOMY, RADICAL Left 1989   With chemotherapy  . PARS PLANA VITRECTOMY W/ REPAIR OF MACULAR HOLE  05/2008   Dr. Zadie Rhine  . RIGHT/LEFT HEART CATH AND CORONARY ANGIOGRAPHY N/A 01/18/2019   Procedure: RIGHT/LEFT HEART CATH AND CORONARY ANGIOGRAPHY;  Surgeon: Martinique, Peter M, MD;  Location: [facility] LAB;  Service: Cardiovascular;  Laterality: N/A;  . TOTAL KNEE ARTHROPLASTY Left 12/29/2011   Dr. Alphonzo Cruise  . TUBAL LIGATION     BILATERAL     Social History   reports that she quit smoking about 32 years ago. Her smoking use included cigarettes. She started smoking about 54 years ago. She has a 5.00 pack-year smoking history. She has never used smokeless tobacco. She reports that she does not drink alcohol or use drugs.   Family History   Her family history includes Breast cancer in her sister; COPD in her mother; Other in her father.   Allergies Allergies  Allergen Reactions  . Codeine Palpitations  . Latex   . Ace Inhibitors Cough     Home Medications  Prior to Admission medications   Medication Sig Start Date End Date Taking? Authorizing Provider  acetaminophen (TYLENOL) 325 MG tablet Take 650 mg by mouth 3 (three) times daily.    [provider]  allopurinol (ZYLOPRIM) 300 MG tablet Take 300 mg daily by mouth. 09/16/17   [provider]  apixaban (ELIQUIS) 5 MG TABS tablet Take 1 tablet (5 mg total) by mouth 2 (two) times daily. 01/08/16   Herminio Commons, MD  atorvastatin (LIPITOR) 20 MG tablet Take 1 tablet (20  mg total) by mouth daily at 6 PM. 01/20/19   Dessa Phi, DO  carbidopa-levodopa (SINEMET IR) 10-100 MG tablet Take 1 tablet by mouth 2 (two) times daily.    [provider]  DULoxetine (CYMBALTA) 60 MG capsule Take 60 mg by mouth daily.  07/13/18   [provider]  fexofenadine (ALLEGRA) 180 MG tablet Take 180 mg by mouth daily.    [provider]  gabapentin (NEURONTIN) 300 MG capsule Take 300 mg by mouth 3 (three) times daily.    [provider]  levothyroxine (SYNTHROID, LEVOTHROID) 100 MCG tablet Take 100 mcg by mouth daily.    [provider]  losartan (COZAAR) 25 MG tablet Take 25 mg by mouth daily.    [provider]  Multiple Vitamins-Minerals (MULTIVITAMINS THER. W/MINERALS) TABS tablet Take 1 tablet by mouth daily.    [provider]  multivitamin-lutein (OCUVITE-LUTEIN) CAPS capsule Take 1 capsule by mouth daily.    [provider]  omeprazole (PRILOSEC) 20 MG capsule Take 20 mg by mouth daily.    [provider]  polyethylene glycol powder (GLYCOLAX/MIRALAX) powder Take 17 g daily as needed by mouth. 09/16/17   [provider]  potassium chloride SA (K-DUR,KLOR-CON) 20 MEQ tablet Take 1 tablet (20 mEq total) by mouth daily. 01/20/19   Dessa Phi, DO  primidone (MYSOLINE) 50 MG tablet Take 100 mg by mouth 3 (three) times daily.     [provider]  torsemide (DEMADEX) 20 MG tablet Take 2 tablets (40 mg total) by mouth 2 (two) times daily for 30 days. 01/20/19 07/10/19  Dessa Phi, DO  traZODone (DESYREL) 50 MG tablet Take 50 mg by mouth at bedtime.  07/13/18   [provider]  Critical care time: 74

## 2020-02-12 NOTE — Progress Notes (Signed)
RT obtained ABG on pt with the following results. Pt was on [place] 6Lpm at time of collection. RT will continue to monitor.  Results for EDDIS, PINGLETON (MRN [id number]) as of 02/12/2020 05:12  Ref. Range 02/12/2020 04:41  Sample type Unknown ARTERIAL  FIO2 Unknown 44.00  pH, Arterial Latest Ref Range: 7.350 - 7.450  7.271 (L)  pCO2 arterial Latest Ref Range: 32.0 - 48.0 mmHg 23.5 (L)  pO2, Arterial Latest Ref Range: 83.0 - 108.0 mmHg 84.2  Acid-base deficit Latest Ref Range: 0.0 - 2.0 mmol/L 15.1 (H)  Bicarbonate Latest Ref Range: 20.0 - 28.0 mmol/L 10.5 (L)  O2 Saturation Latest Units: % 91.8  Patient temperature Unknown 36.4  Collection site Unknown RIGHT RADIAL  [place] test (pass/fail) Latest Ref Range: PASS  PASS

## 2020-02-12 NOTE — Consult Note (Addendum)
[facility] Nephrology Consultation Note  Requesting MD: Dr Everardo All Reason for consult: AKI  HPI:  Leslie Alexander is a 79 y.o. female with history of hypertension, obesity, OSA, CKD stage III, A. fib, pulmonary hypertension associated with right heart dysfunction, nonobstructive CAD, transferred from [facility], seen as a consultation at the request of Dr. Everardo All for acute kidney injury.  Patient was initially presented to [facility] on 02/01/2020 for CHF exacerbation.  She developed Klebsiella UTI and possible pneumonia for which patient received ceftriaxone and azithromycin.  She was initially diuresed with IV Lasix and reportedly patient developed hyponatremia and electrolytes abnormalities.  Subsequently the diuretics were held and patient was treated with IV fluid for few days.  Gradually patient developed fluid overload and family wanted to transfer patient to [facility] for further care.  At [facility] the lab were significant for a lactic acid level of 9, bicarb of 10, hyperkalemia and INR of 5.  Today morning, the labs showed serum sodium of 132, potassium 6.1, CO2 13 and creatinine level of 2190.  Patient also with elevated liver enzymes, BNP 1448, lactic acid level of 7.  Chest x-ray with pulmonary edema. Received Lasix 80 mg IV with minimal response.  Cardiology was consulted and subsequently patient was started on dobutamine for cardiogenic shock.  In the ICU patient was hypotensive to 70s which improved with inotropes.  Treated with Lokelma for hyperkalemia.  The follow-up labs showed potassium level of 5.6, CO2 17, BUN 61 and creatinine 2.76.  The urinalysis showed cloudy urine, protein 100, hematuria and WBC consistent with possible UTI.  Previously the creatinine level was 1.24 in 01/2019.  Patient is currently on BiPAP with 50% FiO2.  Minimally responding with the name.  SBP 138/76 with dobutamine.  Unable to obtain review of system.  Her daughter at  bedside.   Creatinine, Ser  Date/Time Value Ref Range Status  02/12/2020 09:31 AM 2.76 (H) 0.44 - 1.00 mg/dL Final  93/71/6967 89:38 AM 2.90 (H) 0.44 - 1.00 mg/dL Final  09/08/5101 58:52 PM 2.36 (H) 0.44 - 1.00 mg/dL Final  77/82/4235 36:14 PM 1.24 (H) 0.44 - 1.00 mg/dL Final  43/15/4008 67:61 AM 1.02 (H) 0.44 - 1.00 mg/dL Final  95/07/3266 12:45 AM 1.26 (H) 0.44 - 1.00 mg/dL Final  80/99/8338 25:05 AM 1.22 (H) 0.44 - 1.00 mg/dL Final  39/76/7341 93:79 AM 1.27 (H) 0.44 - 1.00 mg/dL Final  02/40/9735 32:99 PM 1.34 (H) 0.44 - 1.00 mg/dL Final  24/26/8341 96:22 PM 1.42 (H) 0.44 - 1.00 mg/dL Final  29/79/8921 19:41 AM 1.39 (H) 0.44 - 1.00 mg/dL Final  74/06/1447 18:56 AM 1.14 (H) 0.50 - 1.10 mg/dL Final  31/49/7026 37:85 AM 1.23 (H) 0.50 - 1.10 mg/dL Final  88/50/2774 12:87 AM 1.20 (H) 0.50 - 1.10 mg/dL Final  86/76/7209 47:09 PM 0.88  Final  04/13/2008 11:23 AM 0.75  Final     PMHx:   Past Medical History:  Diagnosis Date  . Atrial fibrillation (HCC)   . CAD (coronary artery disease)    a. mild nonobstructive CAD by cath in 11/2014. b. repeat cath in 12/2018 showing minor CAD; noted to have moderate pulmonary HTN  . Chronic combined systolic (congestive) and diastolic (congestive) heart failure (HCC)    a. EF reduced to 40-45% by echo in 12/2018  . Depression   . Essential hypertension, benign   . GERD (gastroesophageal reflux disease)   . History of breast cancer   . Hx of atrial  fibrillation, no current medication    converted out of A. Fib with cardioversion  . Hypothyroidism   . Left bundle branch block    negative Lexiscan Myoview; EF 56%, 3/13   . Lumbar disc disease   . Meralgia paresthetica, right   . Mixed hyperlipidemia   . Morbid obesity (HCC)   . Nonalcoholic fatty liver disease   . OSA (obstructive sleep apnea)    CPAP machine  . Pulmonary hypertension (HCC)    RVSP 55-60 mm mercury    Past Surgical History:  Procedure Laterality Date  . CATARACT  EXTRACTION W/PHACO Left 12/18/2013   Procedure: CATARACT EXTRACTION PHACO AND INTRAOCULAR LENS PLACEMENT (IOC);  Surgeon: Gemma Payor, MD;  Location: [facility];  Service: Ophthalmology;  Laterality: Left;  CDE 12.70  . CATARACT EXTRACTION, BILATERAL  04/19/2008   Dr. Alto Denver  . FEMORAL EXPLORATION  12/2003   RIGHT LATERAL FEMORAL CUTANEOUS NERVE/Dr. Channing Mutters  . HEEL SPUR SURGERY  07/2006   For spurs/Dr. Ulice Brilliant  . INCISION AND DRAINAGE OF WOUND  01/26/2012   Dr. Chaney Malling  . KNEE ARTHROSCOPY  10/2005   Left knee/from torn cartilage Dr. Edger House  . LAPAROSCOPIC CHOLECYSTECTOMY  03/02/2011   Dr. Gabriel Cirri  . LEFT HEART CATHETERIZATION WITH CORONARY ANGIOGRAM N/A 12/20/2014   Procedure: LEFT HEART CATHETERIZATION WITH CORONARY ANGIOGRAM;  Surgeon: Peter M Swaziland, MD;  Location: [facility] CATH LAB;  Service: Cardiovascular;  Laterality: N/A;  . MASTECTOMY, RADICAL Left 1989   With chemotherapy  . PARS PLANA VITRECTOMY W/ REPAIR OF MACULAR HOLE  05/2008   Dr. Luciana Axe  . RIGHT/LEFT HEART CATH AND CORONARY ANGIOGRAPHY N/A 01/18/2019   Procedure: RIGHT/LEFT HEART CATH AND CORONARY ANGIOGRAPHY;  Surgeon: Swaziland, Peter M, MD;  Location: [facility] INVASIVE CV LAB;  Service: Cardiovascular;  Laterality: N/A;  . TOTAL KNEE ARTHROPLASTY Left 12/29/2011   Dr. Chaney Malling  . TUBAL LIGATION     BILATERAL    Family Hx:  Family History  Problem Relation Age of Onset  . Other Father        Ulcer disease  . COPD Mother   . Breast cancer Sister     Social History:  reports that she quit smoking about 32 years ago. Her smoking use included cigarettes. She started smoking about 54 years ago. She has a 5.00 pack-year smoking history. She has never used smokeless tobacco. She reports that she does not drink alcohol or use drugs.  Allergies:  Allergies  Allergen Reactions  . Codeine Palpitations  . Latex   . Ace Inhibitors Cough    Medications: Prior to Admission medications   Medication Sig Start Date End Date Taking?  Authorizing Provider  acetaminophen (TYLENOL) 325 MG tablet Take 650 mg by mouth 3 (three) times daily.    [provider]  allopurinol (ZYLOPRIM) 300 MG tablet Take 300 mg daily by mouth. 09/16/17   [provider]  apixaban (ELIQUIS) 5 MG TABS tablet Take 1 tablet (5 mg total) by mouth 2 (two) times daily. 01/08/16   Laqueta Linden, MD  atorvastatin (LIPITOR) 20 MG tablet Take 1 tablet (20 mg total) by mouth daily at 6 PM. 01/20/19   Noralee Stain, DO  carbidopa-levodopa (SINEMET IR) 10-100 MG tablet Take 1 tablet by mouth 2 (two) times daily.    [provider]  DULoxetine (CYMBALTA) 60 MG capsule Take 60 mg by mouth daily.  07/13/18   [provider]  fexofenadine (ALLEGRA) 180 MG tablet Take 180 mg by mouth daily.  [provider]  gabapentin (NEURONTIN) 300 MG capsule Take 300 mg by mouth 3 (three) times daily.    [provider]  levothyroxine (SYNTHROID, LEVOTHROID) 100 MCG tablet Take 100 mcg by mouth daily.    [provider]  losartan (COZAAR) 25 MG tablet Take 25 mg by mouth daily.    [provider]  Multiple Vitamins-Minerals (MULTIVITAMINS THER. W/MINERALS) TABS tablet Take 1 tablet by mouth daily.    [provider]  multivitamin-lutein (OCUVITE-LUTEIN) CAPS capsule Take 1 capsule by mouth daily.    [provider]  omeprazole (PRILOSEC) 20 MG capsule Take 20 mg by mouth daily.    [provider]  polyethylene glycol powder (GLYCOLAX/MIRALAX) powder Take 17 g daily as needed by mouth. 09/16/17   [provider]  potassium chloride SA (K-DUR,KLOR-CON) 20 MEQ tablet Take 1 tablet (20 mEq total) by mouth daily. 01/20/19   Noralee Stain, DO  primidone (MYSOLINE) 50 MG tablet Take 100 mg by mouth 3 (three) times daily.     [provider]  torsemide (DEMADEX) 20 MG tablet Take 2 tablets (40 mg total) by mouth 2 (two) times daily for 30 days. 01/20/19 07/10/19  Noralee Stain, DO  traZODone (DESYREL) 50 MG tablet Take 50 mg by mouth at bedtime.  07/13/18   [provider]    I have reviewed the patient's current medications.  Labs:  Results for orders placed or performed during the hospital encounter of 02/20/2020 (from the past 48 hour(s))  Hepatic function panel     Status: Abnormal   Collection Time: 01/26/2020 11:35 PM  Result Value Ref Range   Total Protein 7.0 6.5 - 8.1 g/dL   Albumin 2.8 (L) 3.5 - 5.0 g/dL   AST 426 (H) 15 - 41 U/L   ALT 150 (H) 0 - 44 U/L   Alkaline Phosphatase 653 (H) 38 - 126 U/L   Total Bilirubin 2.8 (H) 0.3 - 1.2 mg/dL   Bilirubin, Direct 1.8 (H) 0.0 - 0.2 mg/dL   Indirect Bilirubin 1.0 (H) 0.3 - 0.9 mg/dL    Comment: Performed at [facility], [address] [place], [place] [address]  Basic metabolic panel     Status: Abnormal   Collection Time: 02/06/2020 11:35 PM  Result Value Ref Range   Sodium 132 (L) 135 - 145 mmol/L   Potassium 6.4 (HH) 3.5 - 5.1 mmol/L    Comment: NO VISIBLE HEMOLYSIS CRITICAL RESULT CALLED TO, READ BACK BY AND VERIFIED WITH: C HALL,RN 01/15/2020 0118 WILDERK    Chloride 100 98 - 111 mmol/L   CO2 10 (L) 22 - 32 mmol/L   Glucose, Bld 66 (L) 70 - 99 mg/dL    Comment: Glucose reference range applies only to samples taken after fasting for at least 8 hours.   BUN 52 (H) 8 - 23 mg/dL   Creatinine, Ser 6.22 (H) 0.44 - 1.00 mg/dL   Calcium 9.5 8.9 - 29.7 mg/dL   GFR calc non Af Amer 19 (L) >60 mL/min   GFR calc Af Amer 22 (L) >60 mL/min   Anion gap 22 (H) 5 - 15    Comment: Performed at [facility], [address] [place], [place] [address]  CBC with Differential/Platelet     Status: Abnormal   Collection Time: 02/14/2020 11:35 PM  Result Value Ref Range   WBC 18.9 (H) 4.0 - 10.5 K/uL   RBC 4.65 3.87 - 5.11 MIL/uL   Hemoglobin 14.1  12.0 - 15.0 g/dL   HCT 16.1 09.6 - 04.5 %   MCV 97.4 80.0 - 100.0 fL   MCH 30.3 26.0 - 34.0 pg   MCHC 31.1 30.0 - 36.0 g/dL   RDW 40.9 (H)  81.1 - 15.5 %   Platelets 254 150 - 400 K/uL   nRBC 0.8 (H) 0.0 - 0.2 %   Neutrophils Relative % 85 %   Neutro Abs 16.1 (H) 1.7 - 7.7 K/uL   Lymphocytes Relative 7 %   Lymphs Abs 1.3 0.7 - 4.0 K/uL   Monocytes Relative 7 %   Monocytes Absolute 1.3 (H) 0.1 - 1.0 K/uL   Eosinophils Relative 0 %   Eosinophils Absolute 0.0 0.0 - 0.5 K/uL   Basophils Relative 0 %   Basophils Absolute 0.0 0.0 - 0.1 K/uL   Immature Granulocytes 1 %   Abs Immature Granulocytes 0.21 (H) 0.00 - 0.07 K/uL    Comment: Performed at [facility], [address], [place], [place] [address]  Magnesium     Status: None   Collection Time: March 01, 2020 11:35 PM  Result Value Ref Range   Magnesium 2.1 1.7 - 2.4 mg/dL    Comment: Performed at [facility], [address], [place], [place] [address]  TSH     Status: None   Collection Time: 03/01/2020 11:35 PM  Result Value Ref Range   TSH 0.891 0.350 - 4.500 uIU/mL    Comment: Performed by a 3rd Generation assay with a functional sensitivity of <=0.01 uIU/mL. Performed at [facility], [address] [place], [place] [address]   Protime-INR     Status: Abnormal   Collection Time: 01-Mar-2020 11:35 PM  Result Value Ref Range   Prothrombin Time 46.5 (H) 11.4 - 15.2 seconds   INR 5.0 (HH) 0.8 - 1.2    Comment: REPEATED TO VERIFY CRITICAL RESULT CALLED TO, READ BACK BY AND VERIFIED WITH: RN DILTEA FRANCO AT 0055 BY MESSAN HOUEGNIFIO ON 02/12/2020 (NOTE) INR goal varies based on device and disease states. Performed at [facility], [address] [place], [place] [address]   Troponin I (High Sensitivity)     Status: None   Collection Time: March 01, 2020 11:35 PM  Result Value Ref Range   Troponin I (High Sensitivity) 15 <18 ng/L    Comment: (NOTE) Elevated high sensitivity troponin I (hsTnI) values and significant  changes across serial measurements may suggest ACS but many other  chronic and acute conditions are known to elevate hsTnI results.   Refer to the Links section for chest pain algorithms and additional  guidance. Performed at [facility], [address] [place], [place] [address]   SARS CORONAVIRUS 2 (TAT 6-24 HRS) Nasopharyngeal Nasopharyngeal Swab     Status: None   Collection Time: Mar 01, 2020 11:45 PM   Specimen: Nasopharyngeal Swab  Result Value Ref Range   SARS Coronavirus 2 NEGATIVE NEGATIVE    Comment: (NOTE) SARS-CoV-2 target nucleic acids are NOT DETECTED. The SARS-CoV-2 RNA is generally detectable in upper and lower respiratory specimens during the acute phase of infection. Negative results do not preclude SARS-CoV-2 infection, do not rule out co-infections with other pathogens, and should not be used as the sole basis for treatment or other patient management decisions. Negative results must be combined with clinical observations, patient history, and epidemiological information. The expected result is Negative. Fact Sheet for Patients: HairSlick.no Fact Sheet for Healthcare Providers: quierodirigir.com This test is not yet  approved or cleared by the [place] and  has been authorized for detection and/or diagnosis of SARS-CoV-2 by FDA under an Emergency Use Authorization (EUA). This EUA will remain  in effect (meaning this test can be used) for the duration of the COVID-19 declaration under Section 56 4(b)(1) of the Act, 21 U.S.C. section 360bbb-3(b)(1), unless the authorization is terminated or revoked sooner. Performed at [facility], [place] [address], [place], [place] [address]   Glucose, capillary     Status: Abnormal   Collection Time: 02/12/20  2:07 AM  Result Value Ref Range   Glucose-Capillary 31 (LL) 70 - 99 mg/dL    Comment: Glucose reference range applies only to samples taken after fasting for at least 8 hours.   Comment 1 Notify RN   Glucose, capillary     Status: Abnormal   Collection Time: 02/12/20  2:47 AM   Result Value Ref Range   Glucose-Capillary 104 (H) 70 - 99 mg/dL    Comment: Glucose reference range applies only to samples taken after fasting for at least 8 hours.  Lactic acid, plasma     Status: Abnormal   Collection Time: 02/12/20  2:51 AM  Result Value Ref Range   Lactic Acid, Venous 8.9 (HH) 0.5 - 1.9 mmol/L    Comment: CRITICAL RESULT CALLED TO, READ BACK BY AND VERIFIED WITH: C HALL,RN 02/12/2020 [place] Performed at [facility], [place] [address], [place], [place] [address]   Troponin I (High Sensitivity)     Status: None   Collection Time: 02/12/20  2:52 AM  Result Value Ref Range   Troponin I (High Sensitivity) 15 <18 ng/L    Comment: (NOTE) Elevated high sensitivity troponin I (hsTnI) values and significant  changes across serial measurements may suggest ACS but many other  chronic and acute conditions are known to elevate hsTnI results.  Refer to the "Links" section for chest pain algorithms and additional  guidance. Performed at [facility], [place] [address] [place], [place] [address]   Procalcitonin - Baseline     Status: None   Collection Time: 02/12/20  2:52 AM  Result Value Ref Range   Procalcitonin 0.58 ng/mL    Comment:        Interpretation: PCT > 0.5 ng/mL and <= 2 ng/mL: Systemic infection (sepsis) is possible, but other conditions are known to elevate PCT as well. (NOTE)       Sepsis PCT Algorithm           Lower Respiratory Tract                                      Infection PCT Algorithm    ----------------------------     ----------------------------         PCT < 0.25 ng/mL                PCT < 0.10 ng/mL         Strongly encourage             Strongly discourage   discontinuation of antibiotics    initiation of antibiotics    ----------------------------     -----------------------------       PCT 0.25 - 0.50 ng/mL            PCT 0.10 - 0.25 ng/mL  OR       >80% decrease in PCT            Discourage initiation  of                                            antibiotics      Encourage discontinuation           of antibiotics    ----------------------------     -----------------------------         PCT >= 0.50 ng/mL              PCT 0.26 - 0.50 ng/mL                AND       <80% decrease in PCT             Encourage initiation of                                             antibiotics       Encourage continuation           of antibiotics    ----------------------------     -----------------------------        PCT >= 0.50 ng/mL                  PCT > 0.50 ng/mL               AND         increase in PCT                  Strongly encourage                                      initiation of antibiotics    Strongly encourage escalation           of antibiotics                                     -----------------------------                                           PCT <= 0.25 ng/mL                                                 OR                                        > 80% decrease in PCT                                     Discontinue / Do not initiate  antibiotics Performed at [facility], [address] [place], [place] [address]   Urinalysis, Routine w reflex microscopic     Status: Abnormal   Collection Time: 02/12/20  3:26 AM  Result Value Ref Range   Color, Urine AMBER (A) YELLOW    Comment: BIOCHEMICALS MAY BE AFFECTED BY COLOR   APPearance CLOUDY (A) CLEAR   Specific Gravity, Urine 1.024 1.005 - 1.030   pH 5.0 5.0 - 8.0   Glucose, UA NEGATIVE NEGATIVE mg/dL   Hgb urine dipstick LARGE (A) NEGATIVE   Bilirubin Urine NEGATIVE NEGATIVE   Ketones, ur NEGATIVE NEGATIVE mg/dL   Protein, ur 604 (A) NEGATIVE mg/dL   Nitrite NEGATIVE NEGATIVE   Leukocytes,Ua TRACE (A) NEGATIVE   RBC / HPF >50 (H) 0 - 5 RBC/hpf   WBC, UA 21-50 0 - 5 WBC/hpf   Bacteria, UA RARE (A) NONE SEEN   Squamous Epithelial / LPF 0-5 0 - 5   Mucus PRESENT     Hyphae Yeast PRESENT    Granular Casts, UA PRESENT     Comment: Performed at [facility], [address], [place], [place] [address]  Blood gas, arterial     Status: Abnormal   Collection Time: 02/12/20  4:41 AM  Result Value Ref Range   FIO2 44.00    pH, Arterial 7.271 (L) 7.350 - 7.450   pCO2 arterial 23.5 (L) 32.0 - 48.0 mmHg   pO2, Arterial 84.2 83.0 - 108.0 mmHg   Bicarbonate 10.5 (L) 20.0 - 28.0 mmol/L   Acid-base deficit 15.1 (H) 0.0 - 2.0 mmol/L   O2 Saturation 91.8 %   Patient temperature 36.4    Collection site RIGHT RADIAL    Drawn by COLLECTED BY RT     Comment: [id number]   Sample type ARTERIAL    Allens test (pass/fail) PASS PASS    Comment: Performed at [facility], [address] [place], [place] [address]  Lactic acid, plasma     Status: Abnormal   Collection Time: 02/12/20  4:57 AM  Result Value Ref Range   Lactic Acid, Venous 9.2 (HH) 0.5 - 1.9 mmol/L    Comment: CRITICAL VALUE NOTED.  VALUE IS CONSISTENT WITH PREVIOUSLY REPORTED AND CALLED VALUE. Performed at [facility], [address], [place], [place] [address]   Brain natriuretic peptide     Status: Abnormal   Collection Time: 02/12/20  5:31 AM  Result Value Ref Range   B Natriuretic Peptide 1,448.7 (H) 0.0 - 100.0 pg/mL    Comment: Performed at [facility], [address], [place], [place] [address]  Lipase, blood     Status: None   Collection Time: 02/12/20  5:31 AM  Result Value Ref Range   Lipase 35 11 - 51 U/L    Comment: Performed at [facility], [address], [place], [place] [address]  Comprehensive metabolic panel     Status: Abnormal   Collection Time: 02/12/20  5:58 AM  Result Value Ref Range   Sodium 132 (L) 135 - 145 mmol/L   Potassium 6.1 (H) 3.5 - 5.1 mmol/L   Chloride 98 98 - 111 mmol/L   CO2 13 (L) 22 - 32 mmol/L   Glucose, Bld 111 (H) 70 - 99 mg/dL    Comment: Glucose reference range applies only to samples taken after fasting for at least 8 hours.    BUN 58 (H) 8 - 23 mg/dL   Creatinine,  Ser 2.90 (H) 0.44 - 1.00 mg/dL   Calcium 9.2 8.9 - 25.9 mg/dL   Total Protein 6.4 (L) 6.5 - 8.1 g/dL   Albumin 2.6 (L) 3.5 - 5.0 g/dL   AST 563 (H) 15 - 41 U/L   ALT 190 (H) 0 - 44 U/L   Alkaline Phosphatase 634 (H) 38 - 126 U/L   Total Bilirubin 2.8 (H) 0.3 - 1.2 mg/dL   GFR calc non Af Amer 15 (L) >60 mL/min   GFR calc Af Amer 17 (L) >60 mL/min   Anion gap 21 (H) 5 - 15    Comment: Performed at [facility], [address], [place], [place] [address]  Lactic acid, plasma     Status: Abnormal   Collection Time: 02/12/20  5:58 AM  Result Value Ref Range   Lactic Acid, Venous 7.0 (HH) 0.5 - 1.9 mmol/L    Comment: CRITICAL VALUE NOTED.  VALUE IS CONSISTENT WITH PREVIOUSLY REPORTED AND CALLED VALUE. Performed at [facility], [address] [place], [place] [address]   Magnesium     Status: None   Collection Time: 02/12/20  5:58 AM  Result Value Ref Range   Magnesium 2.0 1.7 - 2.4 mg/dL    Comment: Performed at [facility], [address], [place], [place] [address]  CBC     Status: Abnormal   Collection Time: 02/12/20  5:58 AM  Result Value Ref Range   WBC 17.6 (H) 4.0 - 10.5 K/uL   RBC 4.32 3.87 - 5.11 MIL/uL   Hemoglobin 12.9 12.0 - 15.0 g/dL   HCT 66.0 63.0 - 16.0 %   MCV 98.1 80.0 - 100.0 fL   MCH 29.9 26.0 - 34.0 pg   MCHC 30.4 30.0 - 36.0 g/dL   RDW 10.9 (H) 32.3 - 55.7 %   Platelets 258 150 - 400 K/uL   nRBC 0.5 (H) 0.0 - 0.2 %    Comment: Performed at [facility], [address] [place], [place] [address]  Protime-INR     Status: Abnormal   Collection Time: 02/12/20  5:58 AM  Result Value Ref Range   Prothrombin Time 54.9 (H) 11.4 - 15.2 seconds   INR 6.2 (HH) 0.8 - 1.2    Comment: Royetta Asal, RN [phone] 02/12/2020 BY MACEDA,J. (NOTE) INR goal varies based on device and disease states. Performed at [facility], [address], [place], [place] [address]   Phosphorus     Status: Abnormal    Collection Time: 02/12/20  5:58 AM  Result Value Ref Range   Phosphorus 6.9 (H) 2.5 - 4.6 mg/dL    Comment: Performed at [facility], [address], [place], [place] [address]  Gamma GT     Status: Abnormal   Collection Time: 02/12/20  5:58 AM  Result Value Ref Range   GGT 778 (H) 7 - 50 U/L    Comment: Performed at [facility], [address], [place], [place] [address]  Glucose, capillary     Status: Abnormal   Collection Time: 02/12/20  6:05 AM  Result Value Ref Range   Glucose-Capillary 113 (H) 70 - 99 mg/dL    Comment: Glucose reference range applies only to samples taken after fasting for at least 8 hours.   Comment 1 Notify RN   Glucose, capillary     Status: Abnormal   Collection Time: 02/12/20  7:47 AM  Result Value Ref Range  Glucose-Capillary 118 (H) 70 - 99 mg/dL    Comment: Glucose reference range applies only to samples taken after fasting for at least 8 hours.  I-STAT 7, (LYTES, BLD GAS, ICA, H+H)     Status: Abnormal   Collection Time: 02/12/20  8:19 AM  Result Value Ref Range   pH, Arterial 7.369 7.350 - 7.450   pCO2 arterial 25.0 (L) 32.0 - 48.0 mmHg   pO2, Arterial 47.0 (L) 83.0 - 108.0 mmHg   Bicarbonate 14.4 (L) 20.0 - 28.0 mmol/L   TCO2 15 (L) 22 - 32 mmol/L   O2 Saturation 83.0 %   Acid-base deficit 9.0 (H) 0.0 - 2.0 mmol/L   Sodium 129 (L) 135 - 145 mmol/L   Potassium 5.6 (H) 3.5 - 5.1 mmol/L   Calcium, Ion 1.05 (L) 1.15 - 1.40 mmol/L   HCT 37.0 36.0 - 46.0 %   Hemoglobin 12.6 12.0 - 15.0 g/dL   Patient temperature HIDE    Sample type ARTERIAL   Lactic acid, plasma     Status: Abnormal   Collection Time: 02/12/20  8:32 AM  Result Value Ref Range   Lactic Acid, Venous 2.7 (HH) 0.5 - 1.9 mmol/L    Comment: CRITICAL VALUE NOTED.  VALUE IS CONSISTENT WITH PREVIOUSLY REPORTED AND CALLED VALUE. Performed at [facility], [address] [place], [place] [address]   Comprehensive metabolic panel     Status: Abnormal   Collection  Time: 02/12/20  9:31 AM  Result Value Ref Range   Sodium 133 (L) 135 - 145 mmol/L   Potassium 5.6 (H) 3.5 - 5.1 mmol/L   Chloride 101 98 - 111 mmol/L   CO2 17 (L) 22 - 32 mmol/L   Glucose, Bld 152 (H) 70 - 99 mg/dL    Comment: Glucose reference range applies only to samples taken after fasting for at least 8 hours.   BUN 61 (H) 8 - 23 mg/dL   Creatinine, Ser 6.04 (H) 0.44 - 1.00 mg/dL   Calcium 8.8 (L) 8.9 - 10.3 mg/dL   Total Protein 5.9 (L) 6.5 - 8.1 g/dL   Albumin 2.4 (L) 3.5 - 5.0 g/dL   AST 540 (H) 15 - 41 U/L   ALT 189 (H) 0 - 44 U/L   Alkaline Phosphatase 543 (H) 38 - 126 U/L   Total Bilirubin 2.5 (H) 0.3 - 1.2 mg/dL   GFR calc non Af Amer 16 (L) >60 mL/min   GFR calc Af Amer 19 (L) >60 mL/min   Anion gap 15 5 - 15    Comment: Performed at [facility], [address] [place], [place] [address]  Troponin I (High Sensitivity)     Status: None   Collection Time: 02/12/20  9:31 AM  Result Value Ref Range   Troponin I (High Sensitivity) 15 <18 ng/L    Comment: (NOTE) Elevated high sensitivity troponin I (hsTnI) values and significant  changes across serial measurements may suggest ACS but many other  chronic and acute conditions are known to elevate hsTnI results.  Refer to the "Links" section for chest pain algorithms and additional  guidance. Performed at [facility], [address], [place], [place] [address]   Glucose, capillary     Status: Abnormal   Collection Time: 02/12/20 11:42 AM  Result Value Ref Range   Glucose-Capillary 143 (H) 70 - 99 mg/dL    Comment: Glucose reference range applies only to samples taken after fasting for at least 8  hours.     ROS: Unable to obtain review of system.  Physical Exam: Vitals:   02/12/20 1146 02/12/20 1200  BP:  138/76  Pulse:  80  Resp:  16  Temp: (!) 97.5 F (36.4 C)   SpO2:  100%     General exam: Lethargic female, on BiPAP.  Ill looking Respiratory system: Coarse breath sound bilateral, no  wheezing appreciated Cardiovascular system: S1 & S2 heard, RRR, no rubs.  Bilateral lower extremity pitting edema present+ Gastrointestinal system: Abdomen is soft and nontender. Normal bowel sounds heard. Central nervous system: Lethargic, trying to open eyes with the name.  Not following commands. Extremities: Bilateral lower extremity has pitting edema, no cyanosis or clubbing. Skin: No ulcer or wound noted. Psychiatry: Unable to assess.  Assessment/Plan:  #Acute kidney injury on CKD III due to poor renal perfusion related with cardiogenic shock.  UA with possible UTI and has protein.  Quantify proteinuria with a spot UPC.  Order renal ultrasound to evaluate size and to rule out obstruction. Agree with Lasix 120 mg this afternoon concomitant with dobutamine.  Monitor the response with diuretics.  If no improvement then she will need renal replacement therapy likely CRRT.  I have discussed this with the patient's daughter who agreed with the plan.  Strict ins and out.  #Hyperkalemia: Status post Lokelma.  Hope Lasix will help.  Continue to follow lab.  #Lactic acidosis due to shock: Lactic acid level improved to 2.7, continue inotrope and current management.  #Cardiogenic shock/biventricular heart failure: With moderate pulmonary hypertension.  Cardiology following.  Diuretics and dobutamine as above.  Plan for echo and cardiac cath when stable.  #Acute respiratory failure with hypoxia: Chest x-ray with pulmonary edema.  On BiPAP.  Volume management with diuretics.  #Hypervolemic hyponatremia: Check urine lites.  Diuretics as above.  #Coagulopathy and acute liver failure: Per primary team.  Receiving FFP.  Thank you for the consult.  We will follow with you.  Raygan Skarda Jaynie Collins 02/12/2020, 1:00 PM  [employer].

## 2020-02-12 NOTE — Progress Notes (Signed)
CRITICAL VALUE ALERT  Critical Value:  Lactic Acid 8.9  Date & Time Notied:  02/12/20 at 0336  Provider Notified: Dr. Toniann Fail paged  Orders Received/Actions taken: continue previous orders and will f/up with critical care.

## 2020-02-12 NOTE — Progress Notes (Signed)
Asked to assist with transport. Pt transferred with RN, RRT, and RT to 2M07.

## 2020-02-12 NOTE — Progress Notes (Signed)
Spoke to [place], Ms. Prestage' daughter and notify her of the transfer of floors and care for her mother.  Provided her with the direct phone number to 36M if she had any further questions.

## 2020-02-12 NOTE — Progress Notes (Signed)
ANTICOAGULATION CONSULT NOTE - Initial Consult  Pharmacy Consult for heparin Indication: atrial fibrillation  Allergies  Allergen Reactions  . Codeine Palpitations  . Latex   . Ace Inhibitors Cough    Patient Measurements:    Vital Signs: Temp: 97.6 F (36.4 C) (03/22 0157) Temp Source: Axillary (03/22 0157) BP: 113/88 (03/22 0159) Pulse Rate: 44 (03/22 0159)  Labs: Recent Labs    02/20/2020 2335 02/12/20 0252  HGB 14.1  --   HCT 45.3  --   PLT 254  --   LABPROT 46.5*  --   INR 5.0*  --   CREATININE 2.36*  --   TROPONINIHS 15 15    CrCl cannot be calculated (Unknown ideal weight.).   Medical History: Past Medical History:  Diagnosis Date  . Atrial fibrillation (HCC)   . CAD (coronary artery disease)    a. mild nonobstructive CAD by cath in 11/2014. b. repeat cath in 12/2018 showing minor CAD; noted to have moderate pulmonary HTN  . Chronic combined systolic (congestive) and diastolic (congestive) heart failure (HCC)    a. EF reduced to 40-45% by echo in 12/2018  . Depression   . Essential hypertension, benign   . GERD (gastroesophageal reflux disease)   . History of breast cancer   . Hx of atrial fibrillation, no current medication    converted out of A. Fib with cardioversion  . Hypothyroidism   . Left bundle branch block    negative Lexiscan Myoview; EF 56%, 3/13   . Lumbar disc disease   . Meralgia paresthetica, right   . Mixed hyperlipidemia   . Morbid obesity (HCC)   . Nonalcoholic fatty liver disease   . OSA (obstructive sleep apnea)    CPAP machine  . Pulmonary hypertension (HCC)    RVSP 55-60 mm mercury    Medications:  Medications Prior to Admission  Medication Sig Dispense Refill Last Dose  . acetaminophen (TYLENOL) 325 MG tablet Take 650 mg by mouth 3 (three) times daily.     Marland Kitchen allopurinol (ZYLOPRIM) 300 MG tablet Take 300 mg daily by mouth.  6   . apixaban (ELIQUIS) 5 MG TABS tablet Take 1 tablet (5 mg total) by mouth 2 (two) times  daily. 180 tablet 3   . atorvastatin (LIPITOR) 20 MG tablet Take 1 tablet (20 mg total) by mouth daily at 6 PM. 30 tablet 0   . carbidopa-levodopa (SINEMET IR) 10-100 MG tablet Take 1 tablet by mouth 2 (two) times daily.     . DULoxetine (CYMBALTA) 60 MG capsule Take 60 mg by mouth daily.      . fexofenadine (ALLEGRA) 180 MG tablet Take 180 mg by mouth daily.     Marland Kitchen gabapentin (NEURONTIN) 300 MG capsule Take 300 mg by mouth 3 (three) times daily.     Marland Kitchen levothyroxine (SYNTHROID, LEVOTHROID) 100 MCG tablet Take 100 mcg by mouth daily.     Marland Kitchen losartan (COZAAR) 25 MG tablet Take 25 mg by mouth daily.     . Multiple Vitamins-Minerals (MULTIVITAMINS THER. W/MINERALS) TABS tablet Take 1 tablet by mouth daily.     . multivitamin-lutein (OCUVITE-LUTEIN) CAPS capsule Take 1 capsule by mouth daily.     Marland Kitchen omeprazole (PRILOSEC) 20 MG capsule Take 20 mg by mouth daily.     . polyethylene glycol powder (GLYCOLAX/MIRALAX) powder Take 17 g daily as needed by mouth.  6   . potassium chloride SA (K-DUR,KLOR-CON) 20 MEQ tablet Take 1 tablet (20 mEq total) by mouth daily.  30 tablet 0   . primidone (MYSOLINE) 50 MG tablet Take 100 mg by mouth 3 (three) times daily.      Marland Kitchen torsemide (DEMADEX) 20 MG tablet Take 2 tablets (40 mg total) by mouth 2 (two) times daily for 30 days. 120 tablet 0   . traZODone (DESYREL) 50 MG tablet Take 50 mg by mouth at bedtime.        Assessment: 79 yo lady admitted with SOB and edema to start heparin. She was on eliquis for afib. INR on admission 5.0.  Last dose eliquis 3/21 ~ 11:00 Goal of Therapy:  APTT 66-102 sec Heparin level 0.3-0.7 units/ml Monitor platelets by anticoagulation protocol: Yes   Plan:  Will start heparin later today ~ 24 hours after last dose eliquis    Antoria Lanza [place]et 02/12/2020,3:35 AM

## 2020-02-12 NOTE — Progress Notes (Signed)
NAME:  Leslie Alexander, MRN:  [id number], DOB:  09-09-1941, LOS: 1 ADMISSION DATE:  02/04/2020, CONSULTATION DATE:  02/12/2020 REFERRING MD:  Hospitalist, CHIEF COMPLAINT:  Lactic acidosis   Brief History   79 yo F with a history of pulmonary hypertension, diastolic heart failure, non-obstructive CAD, OSA, HTN, persistent Afib, obesity, and stage 3 CKD who presented to [facility]01[facility]20 for shortness of breath and swelling, thought to be due to heart failure.  She was also being treated for a klebsiella UTI and possible pneumonia with ceftriaxone and azithromycin.   Patient was initially diuresed aggressively with IV lasix, but then developed hyponatremia, and diuresis was stopped and she was given normal saline.  She was also having issues with hyperkalemia over there and was given kayexalate.   History of present illness   Family wanted her transferred to [facility].  Over at [facility], she was having elevated LFTs, particularly the alk phos.  This evening, patient had an episode of hypoglycemia, which improved with dextrose.  Her labs returned with significant abnormalities including a significant lactic acidosis with lactate of 9, bicarb of 10, hyperkalemia, and INR of 5.  When seen at the bedside, patient is lethargic but overall but alert and oriented, satting 97% on 6L.  She feels unwell, but not in any pain except for mild diffuse abdominal pain, which she attributes to not having a bowel movement. Denies any chest pain and thinks his breathing is "alright."  Past Medical History   Past Medical History:  Diagnosis Date  . Atrial fibrillation ([place])   . CAD (coronary artery disease)    a. mild nonobstructive CAD by cath in 11/2014. b. repeat cath in 12/2018 showing minor CAD; noted to have moderate pulmonary HTN  . Chronic combined systolic (congestive) and diastolic (congestive) heart failure (HCC)    a. EF reduced to 40-45% by echo in 12/2018  . Depression   . Essential hypertension,  benign   . GERD (gastroesophageal reflux disease)   . History of breast cancer   . Hx of atrial fibrillation, no current medication    converted out of A. Fib with cardioversion  . Hypothyroidism   . Left bundle branch block    negative Lexiscan Myoview; EF 56%, 3/13   . Lumbar disc disease   . Meralgia paresthetica, right   . Mixed hyperlipidemia   . Morbid obesity ([place])   . Nonalcoholic fatty liver disease   . OSA (obstructive sleep apnea)    CPAP machine  . Pulmonary hypertension (HCC)    RVSP 55-60 mm mercury     Significant Hospital Events   3/21: Transferred from [facility]  3/22: Transferred to ICU for lactic acidosis  Consults:  Cardiology  Procedures:    Significant Diagnostic Tests:  RUQ doppler 3/9: patent portal vein, liver within normal limits.   CT A/P w/o contrast: Nonobstructive left nephrolithiasis, fat stranding around pancreas (per [facility], this is chronic)  BNP 3838 3/15 -->5965 3/21 Na: 3/15 125-->127-->120>123>125 (3/17)-->130 (3/18) --> 128 (3/20)--> 130 (3/21) Cr: 3/15 1.3-->1.58-->1.77-->1.56--> 0.9 --> 1.37 (3/20) -->1.63 (3/21)  Micro Data:  Urine culture 3/11: Klebsiella (pan-susceptible), 10,000 CFU  Blood cultures 3/11: negative   Antimicrobials:  S/p Ceftriaxone and azithromycin at OSH  Interim history/subjective:  Lying in bed with mild confusion, she reports some chest and lower extremity pain  Objective   Blood pressure 112/64, pulse 84, temperature 97.7 F (36.5 C), temperature source Axillary, resp. rate 18, height '5\' 5"'  (1.651 m),  weight 116.9 kg, SpO2 100 %.    FiO2 (%):  [50 %] 50 %  No intake or output data in the 24 hours ending 02/12/20 0810 Filed Weights   02/12/20 0501  Weight: 116.9 kg    Examination: General: Chronically ill appearing elderly obese female on BIPAP therapy, in NAD HEENT: [place]/AT, MM pink/dry, PERRL,  Neuro: Alert and oriented x2, confusion to time, able to follow simple  commands  CV: irregularly irregular rate and rhythm, no murmur, rubs, or gallops,  PULM:  Bilateral crackles with diminished air entry, mild accessory muscles use seen  GI: soft, bowel sounds hypoactive in all 4 quadrants, non-tender, non-distended Extremities: warm/dry, generalized 3+ pitting edema  Skin: no rashes or lesions  Resolved Hospital Problem list     Assessment & Plan:   79 yo woman with a history of pulmonary hypertension, diastolic heart failure, non-obstructive CAD, OSA, HTN, persistent Afib, obesity, and stage 3 CKD being transferred to the MICU for metabolic acidosis (namely lactic acidosis), liver injury, AKI on CKD, concern for cardiogenic shock.    Metabolic acidosis, lactic acidosis:  -Currently patient compensating ok.  Bicarb 13, lactate 9 >> 7, and ABG with pH of 7.27.  Placed on bipap to increase minute ventilation.  Suspect underlying cause could be cardiogenic shock causing a low-flow state, with prerenal AKI and hepatic congestion (patient is cool and wet) leading to liver dysfunction.   Plan: Continue to trend lactic, downtrending  Continue BIPAP therapy, Continue aggressively diuresing Broad spectrum antibiotics  Trend CMP   Shock, likely cardiogenic: -Pulmonary edema on CXR, she is cold and wet on exam.  Signs of RV more than LV dysfunction on previous echo at [facility].   Plan: Repeat ECHO pending  Cardiology consulted  Aggressive diuretics for volume overload  Continue dobutamine drip  Trend BNP   Acute hypoxic respiratory failure:  - Likely from pulmonary edema  Plan: Remains on BIPAP Consider CT chest once more stabilized  Family and patient agreeable to intubation if needed  Trend ABG as needed  Diurese as above  Wean PEEP and FiO2 for sats greater than 88%. Head of bed elevated 30 degrees. Follow intermittent chest x-ray and ABG.   Ensure adequate pulmonary hygiene  Follow cultures   AKI on CKD Hyperkalemia:   -Concern for  cardiogenic shock Plan: Nephrology consulted, appreciate assistance  Aggressively diurese Close monitoring of renal function  Trend CMP  Closely monitor urine output  Repeat Lokelma doseing   Acute liver injury:   -INR 5 >> 6.2  Alk phos elevating.  Mild elevation in transaminases. GGT 778 Plan: GI consulted  Patient will likely need MRCP once stabilized  Has received oral K Trend LFT   Hypoglycemia:   -Likely due to liver injury, impending liver failure and shock state and poor oral intake  Plan: Frequent CBG checks  Dextrose as needed   Abdominal pain:  Plan: KUB with ileus vs obstruction Bowel regiment   Permanent A-fib Plan: Home Apixaban and Beta blocker therapy on hold  Continuous telemetry   Parkinson's: Home medications include  levo-dopa, carbidopa Plan: Continue home meds   Hypothyroidism:  Plan: Continue home synthroid    Best practice:  Diet: NPO Pain/Anxiety/Delirium protocol (if indicated): n/a VAP protocol (if indicated): n/a DVT prophylaxis: hold while INR >2 GI prophylaxis: n/a Glucose control: glucose q2h for hypoglycemia Mobility: when able Code Status: Full Family Communication: Attempted to call daughter, could not reach her Disposition: 87M ICU  Labs  CBC: Recent Labs  Lab 02/19/2020 2335 02/12/20 0558  WBC 18.9* 17.6*  NEUTROABS 16.1*  --   HGB 14.1 12.9  HCT 45.3 42.4  MCV 97.4 98.1  PLT 254 115    Basic Metabolic Panel: Recent Labs  Lab 02/09/2020 2335 02/12/20 0558  NA 132* 132*  K 6.4* 6.1*  CL 100 98  CO2 10* 13*  GLUCOSE 66* 111*  BUN 52* 58*  CREATININE 2.36* 2.90*  CALCIUM 9.5 9.2  MG 2.1 2.0  PHOS  --  6.9*   GFR: Estimated Creatinine Clearance: 21.1 mL/min (A) (by C-G formula based on SCr of 2.9 mg/dL (H)). Recent Labs  Lab 02/07/2020 2335 02/12/20 0251 02/12/20 0252 02/12/20 0457 02/12/20 0558  PROCALCITON  --   --  0.58  --   --   WBC 18.9*  --   --   --  17.6*  LATICACIDVEN  --  8.9*  --   9.2* 7.0*    Liver Function Tests: Recent Labs  Lab 02/07/2020 2335 02/12/20 0558  AST 182* 232*  ALT 150* 190*  ALKPHOS 653* 634*  BILITOT 2.8* 2.8*  PROT 7.0 6.4*  ALBUMIN 2.8* 2.6*   Recent Labs  Lab 02/12/20 0531  LIPASE 35   No results for input(s): AMMONIA in the last 168 hours.  ABG    Component Value Date/Time   PHART 7.271 (L) 02/12/2020 0441   PCO2ART 23.5 (L) 02/12/2020 0441   PO2ART 84.2 02/12/2020 0441   HCO3 10.5 (L) 02/12/2020 0441   TCO2 24 01/18/2019 1033   ACIDBASEDEF 15.1 (H) 02/12/2020 0441   O2SAT 91.8 02/12/2020 0441     Coagulation Profile: Recent Labs  Lab 02/15/2020 2335 02/12/20 0558  INR 5.0* 6.2*    Cardiac Enzymes: No results for input(s): CKTOTAL, CKMB, CKMBINDEX, TROPONINI in the last 168 hours.  HbA1C: Hgb A1c MFr Bld  Date/Time Value Ref Range Status  12/19/2014 06:45 AM 6.5 (H) <5.7 % Final    Comment:    (NOTE)                                                                       According to the ADA Clinical Practice Recommendations for 2011, when HbA1c is used as a screening test:  >=6.5%   Diagnostic of Diabetes Mellitus           (if abnormal result is confirmed) 5.7-6.4%   Increased risk of developing Diabetes Mellitus References:Diagnosis and Classification of Diabetes Mellitus,Diabetes BWIO,0355,97(CBULA 1):S62-S69 and Standards of Medical Care in         Diabetes - 2011,Diabetes GTXM,4680,32 (Suppl 1):S11-S61.     CBG: Recent Labs  Lab 02/12/20 0207 02/12/20 0247 02/12/20 0605 02/12/20 0747  GLUCAP 31* 104* 113* 118*    Review of Systems:   All other ROS negative except per HPI  Past Medical History  She,  has a past medical history of Atrial fibrillation ([place]), CAD (coronary artery disease), Chronic combined systolic (congestive) and diastolic (congestive) heart failure ([place]), Depression, Essential hypertension, benign, GERD (gastroesophageal reflux disease), History of breast cancer, atrial  fibrillation, no current medication, Hypothyroidism, Left bundle branch block, Lumbar disc disease, Meralgia paresthetica, right, Mixed hyperlipidemia, Morbid obesity ([place]), Nonalcoholic fatty liver disease, OSA (obstructive sleep  apnea), and Pulmonary hypertension ([place]).   Surgical History    Past Surgical History:  Procedure Laterality Date  . CATARACT EXTRACTION W/PHACO Left 12/18/2013   Procedure: CATARACT EXTRACTION PHACO AND INTRAOCULAR LENS PLACEMENT (IOC);  Surgeon: Tonny Branch, MD;  Location: [facility];  Service: Ophthalmology;  Laterality: Left;  CDE 12.70  . CATARACT EXTRACTION, BILATERAL  04/19/2008   Dr. Geoffry Paradise  . FEMORAL EXPLORATION  12/2003   RIGHT LATERAL FEMORAL CUTANEOUS NERVE/Dr. Carloyn Manner  . HEEL SPUR SURGERY  07/2006   For spurs/Dr. Irving Shows  . INCISION AND DRAINAGE OF WOUND  01/26/2012   Dr. Alphonzo Cruise  . KNEE ARTHROSCOPY  10/2005   Left knee/from torn cartilage Dr. Garfield Cornea  . LAPAROSCOPIC CHOLECYSTECTOMY  03/02/2011   Dr. Anthony Sar  . LEFT HEART CATHETERIZATION WITH CORONARY ANGIOGRAM N/A 12/20/2014   Procedure: LEFT HEART CATHETERIZATION WITH CORONARY ANGIOGRAM;  Surgeon: Peter M Martinique, MD;  Location: [facility] CATH LAB;  Service: Cardiovascular;  Laterality: N/A;  . MASTECTOMY, RADICAL Left 1989   With chemotherapy  . PARS PLANA VITRECTOMY W/ REPAIR OF MACULAR HOLE  05/2008   Dr. Zadie Rhine  . RIGHT/LEFT HEART CATH AND CORONARY ANGIOGRAPHY N/A 01/18/2019   Procedure: RIGHT/LEFT HEART CATH AND CORONARY ANGIOGRAPHY;  Surgeon: Martinique, Peter M, MD;  Location: [place] CV LAB;  Service: Cardiovascular;  Laterality: N/A;  . TOTAL KNEE ARTHROPLASTY Left 12/29/2011   Dr. Alphonzo Cruise  . TUBAL LIGATION     BILATERAL     Social History   reports that she quit smoking about 32 years ago. Her smoking use included cigarettes. She started smoking about 54 years ago. She has a 5.00 pack-year smoking history. She has never used smokeless tobacco. She reports that she does not drink alcohol or use  drugs.   Family History   Her family history includes Breast cancer in her sister; COPD in her mother; Other in her father.   Allergies Allergies  Allergen Reactions  . Codeine Palpitations  . Latex   . Ace Inhibitors Cough     Home Medications  Prior to Admission medications   Medication Sig Start Date End Date Taking? Authorizing Provider  acetaminophen (TYLENOL) 325 MG tablet Take 650 mg by mouth 3 (three) times daily.    [provider]  allopurinol (ZYLOPRIM) 300 MG tablet Take 300 mg daily by mouth. 09/16/17   [provider]  apixaban (ELIQUIS) 5 MG TABS tablet Take 1 tablet (5 mg total) by mouth 2 (two) times daily. 01/08/16   Herminio Commons, MD  atorvastatin (LIPITOR) 20 MG tablet Take 1 tablet (20 mg total) by mouth daily at 6 PM. 01/20/19   Dessa Phi, DO  carbidopa-levodopa (SINEMET IR) 10-100 MG tablet Take 1 tablet by mouth 2 (two) times daily.    [provider]    Critical care time:    Performed by: Johnsie Cancel  Total critical care time: 35 minutes  Critical care time was exclusive of separately billable procedures and treating other patients.  Critical care was necessary to treat or prevent imminent or life-threatening deterioration.  Critical care was time spent personally by me on the following activities: development of treatment plan with patient and/or surrogate as well as nursing, discussions with consultants, evaluation of patient's response to treatment, examination of patient, obtaining history from patient or surrogate, ordering and performing treatments and interventions, ordering and review of laboratory studies, ordering and review of radiographic studies, pulse oximetry and re-evaluation of patient's condition.  Johnsie Cancel, NP-C [employer]  Pulmonary & Critical Care Contact / Pager information can be found on Amion  02/12/2020, 8:28 AM

## 2020-02-12 NOTE — Progress Notes (Addendum)
Progress Note  Patient Name: Leslie Alexander Date of Encounter: 02/12/2020  Primary Cardiologist: Kate Sable, MD   Subjective   On Bipap this AM. DOes not appear to be in pain. Daughter at bedside   Inpatient Medications    Scheduled Meds: . allopurinol  100 mg Oral Daily  . carbidopa-levodopa  1 tablet Oral BID  . Chlorhexidine Gluconate Cloth  6 each Topical Q0600  . DULoxetine  60 mg Oral Daily  . furosemide  80 mg Intravenous Once  . levothyroxine  100 mcg Oral Q0600  . vancomycin variable dose per unstable renal function (pharmacist dosing)   Does not apply See admin instructions   Continuous Infusions: . ceFEPime (MAXIPIME) IV    . DOBUTamine 2.5 mcg/kg/min (02/12/20 0703)   PRN Meds: traZODone   Vital Signs    Vitals:   02/12/20 0600 02/12/20 0749 02/12/20 0800 02/12/20 0817  BP: 112/64  124/63 124/63  Pulse: 84  (!) 51 90  Resp: '18  14 18  ' Temp:  97.7 F (36.5 C)    TempSrc:  Axillary    SpO2: 100%  98%   Weight:      Height:       No intake or output data in the 24 hours ending 02/12/20 0955 Filed Weights   02/12/20 0501  Weight: 116.9 kg    Physical Exam   General: Obese, on Bipap Skin: Warm, dry, intact  Neck: Negative for carotid bruits. + JVD Lungs:Clear to ausculation bilaterally. No wheezes, rales, or rhonchi.  Cardiovascular: Irregularly irregulkar with S1 S2. No murmurs Abdomen: Soft, non-tender, distended. No obvious abdominal masses. Extremities: 3+ BLE edema. Radial pulses 2+ bilaterally Neuro: Bipap ventilation. No focal deficits. No facial asymmetry. MAE spontaneously. Psych: Does not respond to questions appropriately with normal affect. Bipap in place   Labs    Chemistry Recent Labs  Lab 01/23/2020 2335 02/12/20 0558 02/12/20 0819  NA 132* 132* 129*  K 6.4* 6.1* 5.6*  CL 100 98  --   CO2 10* 13*  --   GLUCOSE 66* 111*  --   BUN 52* 58*  --   CREATININE 2.36* 2.90*  --   CALCIUM 9.5 9.2  --   PROT 7.0  6.4*  --   ALBUMIN 2.8* 2.6*  --   AST 182* 232*  --   ALT 150* 190*  --   ALKPHOS 653* 634*  --   BILITOT 2.8* 2.8*  --   GFRNONAA 19* 15*  --   GFRAA 22* 17*  --   ANIONGAP 22* 21*  --      Hematology Recent Labs  Lab 02/03/2020 2335 02/12/20 0558 02/12/20 0819  WBC 18.9* 17.6*  --   RBC 4.65 4.32  --   HGB 14.1 12.9 12.6  HCT 45.3 42.4 37.0  MCV 97.4 98.1  --   MCH 30.3 29.9  --   MCHC 31.1 30.4  --   RDW 17.7* 17.8*  --   PLT 254 258  --     Cardiac EnzymesNo results for input(s): TROPONINI in the last 168 hours. No results for input(s): TROPIPOC in the last 168 hours.   BNP Recent Labs  Lab 02/12/20 0531  BNP 1,448.7*     DDimer No results for input(s): DDIMER in the last 168 hours.   Radiology    DG Abd 1 View  Result Date: 02/12/2020 CLINICAL DATA:  Constipation EXAM: ABDOMEN - 1 VIEW COMPARISON:  None. FINDINGS: There  is air-filled dilated loops of bowel measuring up to 7 cm in transverse dimension, which could represent the transverse colon. Air is seen down to the level the rectum. A small amount of retained contrast seen within the stomach and proximal small bowel. IMPRESSION: Findings suggestive of partial bowel obstruction versus ileus. Electronically Signed   By: Prudencio Pair M.D.   On: 02/12/2020 06:27   DG CHEST PORT 1 VIEW  Result Date: 02/12/2020 CLINICAL DATA:  Shortness of breath EXAM: PORTABLE CHEST 1 VIEW COMPARISON:  02/10/2020 FINDINGS: Cardiomegaly. Bilateral airspace disease, slightly improved since prior study. No effusions. Right PICC line is unchanged. IMPRESSION: Cardiomegaly, diffuse bilateral airspace disease with slight improvement since prior study. This could reflect edema or infection. Electronically Signed   By: Rolm Baptise M.D.   On: 02/12/2020 03:51   Telemetry    02/12/20 Atrial fibrillation with controlled rates in the 80's- Personally Reviewed  ECG    No new tracing as of 02/12/20- Personally Reviewed  Cardiac Studies    TTE 02-08-20 1. The left ventricle is normal in size with normal wall thickness.  2. The left ventricular systolic function is normal, LVEF is visually  estimated at > 55%.  3. The mitral valve leaflets are mildly thickened with normal leaflet  mobility.  4. Mitral annular calcification is present.  5. There is moderate mitral valve regurgitation.  6. The aortic valve is trileaflet with mildly thickened leaflets with normal  excursion.  7. There is mild aortic regurgitation.  8. The left atrium is severely dilated in size.  9. The right ventricle is mildly to moderately dilated in size, with mildly  reduced systolic function.  10. There is moderate tricuspid regurgitation.  11. There is moderate pulmonary hypertension, estimated pulmonary artery  systolic pressure is 56 mmHg.  12. The right atrium is severely dilated in size.   01-18-19 R/L HC  LV end diastolic pressure is mildly elevated.  Hemodynamic findings consistent with moderate pulmonary hypertension.  1. Minor nonobstructive CAD 2. Mildly elevated LV filling pressures 3. Moderate pulmonary HTN. 4. Cardiac index 2.66.   Patient Profile     79 y.o. female with h/o moderate pulm HTN and associated R heart dysfunction (mildly reduced RV systolic function on TTE done 02-07-20), mild non-obst CAD, normal LV systolic function on TTE 1-69-67, OSA, HTN, chronic/persistent afib, morbid obesity, stage 3 CKD transferred from [facility] due to SOB and edema.   Assessment & Plan    1. Pulmonary HTN/RV dysfunction with HFrEF: -Pt transferred from [facility] per family request found to be in presumed cardiogenic shock felt to need dobutamine therapy>>>will consider AHF for further management -Echocardiogram from 02/08/20 with normal EF and mild RV dysfunction per fellow report as above  -Bedside evaluation per PCCM with dilated RV and LV with non-collapsible IVC>>dobutamine initiated today   -Started on IV diuretics and subsequently IVF for the treatment of hyponatremia -Overnight cardiology fellow initiated IV Lasix trial of 44m with poor OU>> will give additional Lasix 120 mg IV for lasix challenge -Once more stable, will likely require further workup with RHC for more definitive evaluation of pressures  -Weight, 257lb today  -I&O, no recorded output  -Record output closely   2. Acute hypoxic respiratory failure: -Likely secondary to pulmonary edema per CXR>>maintained on Bipap ventilation per PCCM -Trial dose of IV Lasix>>>unclear response>>poor UO and currently on Bipap  -Continued management per PCCM   3. Persistent atrial fibrillation: -PTA on Toprol XL 553m>>>discharge summary reported Eliquis  however not on PTA -No AC in the setting of elevated INR at 6.2 today with stable Hb  -Monitor for acute s/s of bleeding>>CBC  4. CAD: -Nonobstructive CAD per [facility] in 12/2018 -No anginal symptoms -Continue ASA, BB -No statin in the setting of elevated LFTs  5. HTN: -Stable, 124/63>112/64>110/65 -Hold antihypertensives for now and monitor closely   6. Acute on chronic kidney disease/acute liver injury: -Creatinine, 2.9 today  -AST/ALT (232/190) elevated on admission with INR at 5>>6.2, Alk phos elevating>>634 now  -Primary team considering GI consultation  -Vit K given   7. Abdominal Pain: -KUB with dilated gas filled loops of bowel concerning for bowel obstruction versus ileus -CT abdomen and pelvis with   Signed, Kathyrn Drown NP-C [facility] Pager: [phone] 02/12/2020, 9:55 AM     For questions or updates, please contact   Please consult [email or web address] for contact info under Cardiology/STEMI.  Patient seen and examined with Kathyrn Drown NP-C.  Agree as above, with the following exceptions and changes as noted below.  Patient seen in the medical ICU currently on BiPAP, responds to voice appropriately.  Denies significant pain.  Spoke to patient's daughter  and sister Abigail Butts and [name] about plan for the day and current therapy.  She has been initiated on a dobutamine infusion given concerns of biventricular failure and cardiogenic shock.  History of moderate pulmonary hypertension, on review of prior documentation it appears pulmonary hypertension is felt to be Group 1 PAH with contribution of obstructive sleep apnea and possible left heart failure.  Gen: on bipap, NAD, CV: iRRR, no murmurs heard, heart sounds distant, Lungs: bilateral anterior crackles, Abd: soft, Extrem: Warm, well perfused, diffuse 2-3+ edema, Neuro/Psych: alert and oriented x 3, normal mood and affect. All available labs, radiology testing, previous records reviewed.  She had minimal output after a dose of 80 mg of IV Lasix.  We will need to appropriately Lasix challenge her in the setting of her renal dysfunction, will give an additional 120 IV in the setting of a creatinine of 2.9.  If this is unsuccessful, consider nephrology evaluation for volume management.  She is on dobutamine, we will continue to monitor if this assists in diuresis.  LFT abnormalities suggestive of possible congestive hepatopathy as well as supratherapeutic INR.  Consider advanced heart failure consultation after echocardiogram is performed and if no response to Lasix challenge.  Elouise Munroe 02/12/20 11:46 AM  CRITICAL CARE TIME: I have spent a total of 37 minutes with patient reviewing hospital notes, telemetry, EKGs, labs and examining the patient as well as establishing an assessment and plan that was discussed with the patient. > 50% of time was spent in direct patient care. The patient is critically ill with multi-organ system failure and requires high complexity decision making for assessment and support, frequent evaluation and titration of therapies, application of advanced monitoring technologies and extensive interpretation of multiple databases.

## 2020-02-12 NOTE — Progress Notes (Addendum)
CRITICAL VALUE ALERT  Critical Value:  INR 5.0  Date & Time Notied:  3/22 at 0100  Provider Notified: Cardiology MD oncall  Orders Received/Actions taken: pending  Per Cardiology, patient will be under Hospitalist services.  [facility] has been paged.

## 2020-02-12 NOTE — Progress Notes (Signed)
CRITICAL VALUE ALERT  Critical Value:  CBG 31  Date & Time Notied:  02/12/20 at 0215  Provider Notified: provider called and was notified of low CBG  Orders Received/Actions taken: orders placed.

## 2020-02-12 NOTE — H&P (Signed)
History and Physical    Leslie Alexander ZTI:[id number] DOB: 1941-09-09 DOA: 2020/02/29  PCP: Richardean Chimera, MD  Patient coming from: Patient was transferred from [facility].  Chief Complaint: Shortness of breath.  HPI: Leslie Alexander is a 79 y.o. female with known history of chronic diastolic CHF, chronic kidney disease creatinine is baseline around 1.2, movement disorder hypothyroidism hypertension neuropathy and gout was admitted to the hospital at [facility] on February 01, 2020 with CHF features.  Patient was placed on IV Lasix and during the course of stay patient was found to have worsening liver function with alkaline phosphatase worsening up to 655 but albumin was 3 AST was 84 ALT 55 total bilirubin 1.1.  Patient during the stay also was treated for possible pneumonia and UTI antibiotic was available.  Since patient symptoms were not getting better with worsening liver function concerning for possible congestive hepatopathy patient was transferred to [facility] under cardiology service.  Due to multitude of problems patient was requested to hospital admission.  At the time of my exam patient appears to be short of breath but not in distress appears volume overloaded.  With abdominal distention and bilateral lower extremity edema.  Patient has no chest pain but does complain of abdominal discomfort around the periumbilical area and patient states she has not moved her bowels for last 4 days.  After admission patient's labs show creatinine of 2.3 which has worsened further.  Potassium 6.4 bicarb of 10 lactic acid of 8 alkaline phosphatase of 6053 AST of 182 ALT of 150 WBC count of 17.6.  I have consulted pulmonary critical care and patient was already seen by cardiology.  ED Course: Patient was a direct admit.  Review of Systems: As per HPI, rest all negative.   Past Medical History:  Diagnosis Date  . Atrial fibrillation (HCC)   . CAD (coronary artery disease)    a. mild  nonobstructive CAD by cath in 11/2014. b. repeat cath in 12/2018 showing minor CAD; noted to have moderate pulmonary HTN  . Chronic combined systolic (congestive) and diastolic (congestive) heart failure (HCC)    a. EF reduced to 40-45% by echo in 12/2018  . Depression   . Essential hypertension, benign   . GERD (gastroesophageal reflux disease)   . History of breast cancer   . Hx of atrial fibrillation, no current medication    converted out of A. Fib with cardioversion  . Hypothyroidism   . Left bundle branch block    negative Lexiscan Myoview; EF 56%, 3/13   . Lumbar disc disease   . Meralgia paresthetica, right   . Mixed hyperlipidemia   . Morbid obesity (HCC)   . Nonalcoholic fatty liver disease   . OSA (obstructive sleep apnea)    CPAP machine  . Pulmonary hypertension (HCC)    RVSP 55-60 mm mercury    Past Surgical History:  Procedure Laterality Date  . CATARACT EXTRACTION W/PHACO Left 12/18/2013   Procedure: CATARACT EXTRACTION PHACO AND INTRAOCULAR LENS PLACEMENT (IOC);  Surgeon: Gemma Payor, MD;  Location: [facility];  Service: Ophthalmology;  Laterality: Left;  CDE 12.70  . CATARACT EXTRACTION, BILATERAL  04/19/2008   Dr. Alto Denver  . FEMORAL EXPLORATION  12/2003   RIGHT LATERAL FEMORAL CUTANEOUS NERVE/Dr. Channing Mutters  . HEEL SPUR SURGERY  07/2006   For spurs/Dr. Ulice Brilliant  . INCISION AND DRAINAGE OF WOUND  01/26/2012   Dr. Chaney Malling  . KNEE ARTHROSCOPY  10/2005   Left knee/from torn  cartilage Dr. Edger House  . LAPAROSCOPIC CHOLECYSTECTOMY  03/02/2011   Dr. Gabriel Cirri  . LEFT HEART CATHETERIZATION WITH CORONARY ANGIOGRAM N/A 12/20/2014   Procedure: LEFT HEART CATHETERIZATION WITH CORONARY ANGIOGRAM;  Surgeon: Peter M Swaziland, MD;  Location: [facility] CATH LAB;  Service: Cardiovascular;  Laterality: N/A;  . MASTECTOMY, RADICAL Left 1989   With chemotherapy  . PARS PLANA VITRECTOMY W/ REPAIR OF MACULAR HOLE  05/2008   Dr. Luciana Axe  . RIGHT/LEFT HEART CATH AND CORONARY ANGIOGRAPHY N/A 01/18/2019    Procedure: RIGHT/LEFT HEART CATH AND CORONARY ANGIOGRAPHY;  Surgeon: Swaziland, Peter M, MD;  Location: [facility] INVASIVE CV LAB;  Service: Cardiovascular;  Laterality: N/A;  . TOTAL KNEE ARTHROPLASTY Left 12/29/2011   Dr. Chaney Malling  . TUBAL LIGATION     BILATERAL     reports that she quit smoking about 32 years ago. Her smoking use included cigarettes. She started smoking about 54 years ago. She has a 5.00 pack-year smoking history. She has never used smokeless tobacco. She reports that she does not drink alcohol or use drugs.  Allergies  Allergen Reactions  . Codeine Palpitations  . Latex   . Ace Inhibitors Cough    Family History  Problem Relation Age of Onset  . Other Father        Ulcer disease  . COPD Mother   . Breast cancer Sister     Prior to Admission medications   Medication Sig Start Date End Date Taking? Authorizing Provider  acetaminophen (TYLENOL) 325 MG tablet Take 650 mg by mouth 3 (three) times daily.    [provider]  allopurinol (ZYLOPRIM) 300 MG tablet Take 300 mg daily by mouth. 09/16/17   [provider]  apixaban (ELIQUIS) 5 MG TABS tablet Take 1 tablet (5 mg total) by mouth 2 (two) times daily. 01/08/16   Laqueta Linden, MD  atorvastatin (LIPITOR) 20 MG tablet Take 1 tablet (20 mg total) by mouth daily at 6 PM. 01/20/19   Noralee Stain, DO  carbidopa-levodopa (SINEMET IR) 10-100 MG tablet Take 1 tablet by mouth 2 (two) times daily.    [provider]  DULoxetine (CYMBALTA) 60 MG capsule Take 60 mg by mouth daily.  07/13/18   [provider]  fexofenadine (ALLEGRA) 180 MG tablet Take 180 mg by mouth daily.    [provider]  gabapentin (NEURONTIN) 300 MG capsule Take 300 mg by mouth 3 (three) times daily.    [provider]  levothyroxine (SYNTHROID, LEVOTHROID) 100 MCG tablet Take 100 mcg by mouth daily.    [provider]  losartan (COZAAR) 25 MG tablet Take 25 mg by mouth daily.    [provider]  Multiple Vitamins-Minerals (MULTIVITAMINS THER. W/MINERALS) TABS tablet Take 1 tablet by mouth daily.    [provider]  multivitamin-lutein (OCUVITE-LUTEIN) CAPS capsule Take 1 capsule by mouth daily.    [provider]  omeprazole (PRILOSEC) 20 MG capsule Take 20 mg by mouth daily.    [provider]  polyethylene glycol powder (GLYCOLAX/MIRALAX) powder Take 17 g daily as needed by mouth. 09/16/17   [provider]  potassium chloride SA (K-DUR,KLOR-CON) 20 MEQ tablet Take 1 tablet (20 mEq total) by mouth daily. 01/20/19   Noralee Stain, DO  primidone (MYSOLINE) 50 MG tablet Take 100 mg by mouth 3 (three) times daily.     [provider]  torsemide (DEMADEX) 20 MG tablet Take 2 tablets (40 mg total) by mouth 2 (two) times daily for 30  days. 01/20/19 07/10/19  Dessa Phi, DO  traZODone (DESYREL) 50 MG tablet Take 50 mg by mouth at bedtime.  07/13/18   [provider]    Physical Exam: Constitutional: Moderately built and nourished. Vitals:   02/12/2020 2035 02/17/2020 2350 02/12/20 0157 02/12/20 0159  BP: 103/77 (!) 90/52  113/88  Pulse: (!) 52 92  (!) 44  Resp: 17 20  (!) 24  Temp: 98.5 F (36.9 C)  97.6 F (36.4 C)   TempSrc: Oral  Axillary   SpO2: 93% 98%  90%   Eyes: Nonicteric no pallor. ENMT: No discharge from the ears eyes nose or mouth. Neck: No mass felt.  No neck rigidity. Respiratory: No rhonchi or crepitations. Cardiovascular: S1-S2 heard. Abdomen: Soft nontender bowel sounds present. Musculoskeletal: Bilateral lower extremity edema present. Skin: No rash. Neurologic: Alert awake oriented time place and person.  Moves all extremities. Psychiatric: Appears normal brown affect.   Labs on Admission: I have personally reviewed following labs and imaging studies  CBC: Recent Labs  Lab 02/19/2020 2335  WBC 18.9*  NEUTROABS 16.1*  HGB 14.1  HCT 45.3  MCV 97.4  PLT 563   Basic Metabolic  Panel: Recent Labs  Lab 01/22/2020 2335  NA 132*  K 6.4*  CL 100  CO2 10*  GLUCOSE 66*  BUN 52*  CREATININE 2.36*  CALCIUM 9.5  MG 2.1   GFR: CrCl cannot be calculated (Unknown ideal weight.). Liver Function Tests: Recent Labs  Lab 02/14/2020 2335  AST 182*  ALT 150*  ALKPHOS 653*  BILITOT 2.8*  PROT 7.0  ALBUMIN 2.8*   No results for input(s): LIPASE, AMYLASE in the last 168 hours. No results for input(s): AMMONIA in the last 168 hours. Coagulation Profile: Recent Labs  Lab 02/07/2020 2335  INR 5.0*   Cardiac Enzymes: No results for input(s): CKTOTAL, CKMB, CKMBINDEX, TROPONINI in the last 168 hours. BNP (last 3 results) No results for input(s): PROBNP in the last 8760 hours. HbA1C: No results for input(s): HGBA1C in the last 72 hours. CBG: Recent Labs  Lab 02/12/20 0207 02/12/20 0247  GLUCAP 31* 104*   Lipid Profile: No results for input(s): CHOL, HDL, LDLCALC, TRIG, CHOLHDL, LDLDIRECT in the last 72 hours. Thyroid Function Tests: Recent Labs    01/22/2020 2335  TSH 0.891   Anemia Panel: No results for input(s): VITAMINB12, FOLATE, FERRITIN, TIBC, IRON, RETICCTPCT in the last 72 hours. Urine analysis:    Component Value Date/Time   COLORURINE YELLOW 01/16/2019 1442   APPEARANCEUR CLEAR 01/16/2019 1442   LABSPEC 1.009 01/16/2019 1442   PHURINE 6.0 01/16/2019 1442   GLUCOSEU NEGATIVE 01/16/2019 1442   HGBUR NEGATIVE 01/16/2019 1442   BILIRUBINUR NEGATIVE 01/16/2019 1442   KETONESUR NEGATIVE 01/16/2019 1442   PROTEINUR NEGATIVE 01/16/2019 1442   UROBILINOGEN 1.0 12/19/2014 0217   NITRITE NEGATIVE 01/16/2019 1442   LEUKOCYTESUR NEGATIVE 01/16/2019 1442   Sepsis Labs: @LABRCNTIP (procalcitonin:4,lacticidven:4) )No results found for this or any previous visit (from the past 240 hour(s)).   Radiological Exams on Admission: No results found.  EKG: Independently reviewed.  Normal sinus rhythm LBBB.  Assessment/Plan Principal Problem:   Acute on  chronic combined systolic and diastolic CHF (congestive heart failure) (HCC) Active Problems:   PAF (paroxysmal atrial fibrillation) (HCC)   Acute CHF (congestive heart failure) (HCC)   ARF (acute renal failure) (HCC)   Elevated LFTs    1. Shock -cause of the shock not clear.  At this time patient will be moved to critical care  services to ICU.  And further recommendations per critical care.  I also ordered procalcitonin and also blood cultures. 2. Acute on chronic combined systolic and diastolic CHF last EF measured on March 17 last week was 55% with severe dilatation of the left atrium and pulmonary pressure was 56 mm.  Presently removing to critical care services may need vasopressors for possible cardiogenic shock.  And diuretics after which. 3. Acute on chronic kidney disease stage II likely worsened due to cardiorenal syndrome. 4. Elevated LFTs cause not clear.  Other causes could be secondary to CHF.  But will have to rule out any intrahepatic causes for which I have ordered MRCP. 5. History of paroxysmal atrial fibrillation presently INR is elevated.  Will hold off Eliquis due to worsening renal function and will keep on heparin when INR is less than 2. 6. History of hypothyroidism on Synthroid.  Check TSH. 7. History of movement disorder on Sinemet. 8. Due to worsening renal function will hold off gabapentin.  Due to sharp patient will need close monitoring for any further worsening in inpatient status.   DVT prophylaxis: INR is abnormal.  Heparin when INR is less than 2. Code Status: DNR as confirmed with patient. Family Communication: We will need to discuss with family. Disposition Plan: To be determined. Consults called: Pulmonary critical care and cardiology. Admission status: Inpatient.   Eduard Clos MD [facility] Pager [phone].  If 7PM-7AM, please contact night-coverage [email or web address] Password [facility]  02/12/2020, 3:27 AM

## 2020-02-12 NOTE — Progress Notes (Signed)
RT placed pt on BIPAP V60 per order w/the settings of 10/5 50% and BUR 8. Pt is tolerating, respiratory status is stable on the BIPAP at this time. RT will continue to monitor.

## 2020-02-12 NOTE — Progress Notes (Signed)
  Echocardiogram 2D Echocardiogram has been performed.  Leslie Alexander 02/12/2020, 2:07 PM

## 2020-02-12 NOTE — Progress Notes (Signed)
Pharmacy Antibiotic Note  Leslie Alexander is a 79 y.o. female admitted on 02/21/2020 with bacteremia.  Pharmacy has been consulted for vancomycin dosing.  Plan: Vancomycin 2gm IV x 1.  Additional doses based on renal function Adjusted cefepime to 1gm IV q24 hours for renal function F/u cultures and clincial course   Height: 5\' 5"  (165.1 cm) Weight: 257 lb 11.5 oz (116.9 kg) IBW/kg (Calculated) : 57  Temp (24hrs), Avg:97.9 F (36.6 C), Min:97.5 F (36.4 C), Max:98.5 F (36.9 C)  Recent Labs  Lab 02/08/2020 2335 02/12/20 0251  WBC 18.9*  --   CREATININE 2.36*  --   LATICACIDVEN  --  8.9*    Estimated Creatinine Clearance: 25.9 mL/min (A) (by C-G formula based on SCr of 2.36 mg/dL (H)).    Allergies  Allergen Reactions  . Codeine Palpitations  . Latex   . Ace Inhibitors Cough     Thank you for allowing pharmacy to be a part of this patient's care.  02/14/20 [place] 02/12/2020 5:15 AM

## 2020-02-12 NOTE — Plan of Care (Signed)
Bedside POCUS showed dilated RV and LV, non-collapsible IVC.  Patient was relatively comfortable on bipap.  WBC is elevated, but procal is low and patient had elevating BNP at OSH (6000) prior to transfer.  -->would like to try low dose dobutamine and lasix and monitor response  KUB showed dilated gas filled loops of bowel concerning for bowel obstruction vs ileus.   -->CT abdomen and pelvis without IV contrast to evaluated for obstruction.  Will also get CT chest for hypoxia.

## 2020-02-13 ENCOUNTER — Inpatient Hospital Stay (HOSPITAL_COMMUNITY): Payer: Medicare PPO

## 2020-02-13 DIAGNOSIS — R7989 Other specified abnormal findings of blood chemistry: Secondary | ICD-10-CM

## 2020-02-13 DIAGNOSIS — I480 Paroxysmal atrial fibrillation: Secondary | ICD-10-CM

## 2020-02-13 DIAGNOSIS — J9601 Acute respiratory failure with hypoxia: Secondary | ICD-10-CM | POA: Diagnosis not present

## 2020-02-13 DIAGNOSIS — I5043 Acute on chronic combined systolic (congestive) and diastolic (congestive) heart failure: Secondary | ICD-10-CM | POA: Diagnosis not present

## 2020-02-13 DIAGNOSIS — N179 Acute kidney failure, unspecified: Secondary | ICD-10-CM | POA: Diagnosis not present

## 2020-02-13 DIAGNOSIS — M7989 Other specified soft tissue disorders: Secondary | ICD-10-CM | POA: Diagnosis not present

## 2020-02-13 LAB — COMPREHENSIVE METABOLIC PANEL
ALT: 261 U/L — ABNORMAL HIGH (ref 0–44)
ALT: 103 U/L — ABNORMAL HIGH (ref 0–44)
AST: 305 U/L — ABNORMAL HIGH (ref 15–41)
AST: 291 U/L — ABNORMAL HIGH (ref 15–41)
Albumin: 2.5 g/dL — ABNORMAL LOW (ref 3.5–5.0)
Albumin: 2.5 g/dL — ABNORMAL LOW (ref 3.5–5.0)
Alkaline Phosphatase: 505 U/L — ABNORMAL HIGH (ref 38–126)
Alkaline Phosphatase: 484 U/L — ABNORMAL HIGH (ref 38–126)
Anion gap: 15 (ref 5–15)
Anion gap: 16 — ABNORMAL HIGH (ref 5–15)
BUN: 66 mg/dL — ABNORMAL HIGH (ref 8–23)
BUN: 63 mg/dL — ABNORMAL HIGH (ref 8–23)
CO2: 20 mmol/L — ABNORMAL LOW (ref 22–32)
CO2: 20 mmol/L — ABNORMAL LOW (ref 22–32)
Calcium: 8.5 mg/dL — ABNORMAL LOW (ref 8.9–10.3)
Calcium: 8.3 mg/dL — ABNORMAL LOW (ref 8.9–10.3)
Chloride: 102 mmol/L (ref 98–111)
Chloride: 100 mmol/L (ref 98–111)
Creatinine, Ser: 2.15 mg/dL — ABNORMAL HIGH (ref 0.44–1.00)
Creatinine, Ser: 1.92 mg/dL — ABNORMAL HIGH (ref 0.44–1.00)
GFR calc Af Amer: 25 mL/min — ABNORMAL LOW (ref >60)
GFR calc Af Amer: 29 mL/min — ABNORMAL LOW (ref >60)
GFR calc non Af Amer: 22 mL/min — ABNORMAL LOW (ref >60)
GFR calc non Af Amer: 25 mL/min — ABNORMAL LOW (ref >60)
Glucose, Bld: 133 mg/dL — ABNORMAL HIGH (ref 70–99)
Glucose, Bld: 187 mg/dL — ABNORMAL HIGH (ref 70–99)
Potassium: 3.8 mmol/L (ref 3.5–5.1)
Potassium: 3.6 mmol/L (ref 3.5–5.1)
Sodium: 137 mmol/L (ref 135–145)
Sodium: 136 mmol/L (ref 135–145)
Total Bilirubin: 1.9 mg/dL — ABNORMAL HIGH (ref 0.3–1.2)
Total Bilirubin: 1.7 mg/dL — ABNORMAL HIGH (ref 0.3–1.2)
Total Protein: 6.3 g/dL — ABNORMAL LOW (ref 6.5–8.1)
Total Protein: 6.2 g/dL — ABNORMAL LOW (ref 6.5–8.1)

## 2020-02-13 LAB — CALCIUM, IONIZED: Calcium, Ionized, Serum: 4 mg/dL — ABNORMAL LOW (ref 4.5–5.6)

## 2020-02-13 LAB — PREPARE FRESH FROZEN PLASMA: Unit division: 0

## 2020-02-13 LAB — GLUCOSE, CAPILLARY
Glucose-Capillary: 130 mg/dL — ABNORMAL HIGH (ref 70–99)
Glucose-Capillary: 136 mg/dL — ABNORMAL HIGH (ref 70–99)
Glucose-Capillary: 147 mg/dL — ABNORMAL HIGH (ref 70–99)
Glucose-Capillary: 153 mg/dL — ABNORMAL HIGH (ref 70–99)
Glucose-Capillary: 156 mg/dL — ABNORMAL HIGH (ref 70–99)

## 2020-02-13 LAB — CBC
HCT: 37.6 % (ref 36.0–46.0)
Hemoglobin: 12.1 g/dL (ref 12.0–15.0)
MCH: 30 pg (ref 26.0–34.0)
MCHC: 32.2 g/dL (ref 30.0–36.0)
MCV: 93.3 fL (ref 80.0–100.0)
Platelets: 177 10*3/uL (ref 150–400)
RBC: 4.03 MIL/uL (ref 3.87–5.11)
RDW: 17.3 % — ABNORMAL HIGH (ref 11.5–15.5)
WBC: 17.5 10*3/uL — ABNORMAL HIGH (ref 4.0–10.5)
nRBC: 0 % (ref 0.0–0.2)

## 2020-02-13 LAB — TROPONIN I (HIGH SENSITIVITY)
Troponin I (High Sensitivity): 10 ng/L (ref <18)
Troponin I (High Sensitivity): 10 ng/L (ref <18)

## 2020-02-13 LAB — PROTIME-INR
INR: 2.6 — ABNORMAL HIGH (ref 0.8–1.2)
Prothrombin Time: 27.7 seconds — ABNORMAL HIGH (ref 11.4–15.2)

## 2020-02-13 LAB — BPAM FFP
Blood Product Expiration Date: 202103272359
ISSUE DATE / TIME: 202103221325
Unit Type and Rh: 6200

## 2020-02-13 LAB — BRAIN NATRIURETIC PEPTIDE: B Natriuretic Peptide: 503.9 pg/mL — ABNORMAL HIGH (ref 0.0–100.0)

## 2020-02-13 LAB — PHOSPHORUS: Phosphorus: 4.5 mg/dL (ref 2.5–4.6)

## 2020-02-13 LAB — MRSA PCR SCREENING: MRSA by PCR: NEGATIVE

## 2020-02-13 LAB — MAGNESIUM: Magnesium: 1.8 mg/dL (ref 1.7–2.4)

## 2020-02-13 MED ORDER — POTASSIUM CHLORIDE CRYS ER 20 MEQ PO TBCR
40.0000 meq | EXTENDED_RELEASE_TABLET | Freq: Two times a day (BID) | ORAL | Status: AC
  Administered 2020-02-13 (×2): 40 meq via ORAL
  Filled 2020-02-13 (×2): qty 2

## 2020-02-13 MED ORDER — FUROSEMIDE 10 MG/ML IJ SOLN
120.0000 mg | Freq: Four times a day (QID) | INTRAVENOUS | Status: AC
  Administered 2020-02-13 (×2): 120 mg via INTRAVENOUS
  Filled 2020-02-13: qty 4
  Filled 2020-02-13 (×2): qty 12

## 2020-02-13 MED ORDER — SODIUM CHLORIDE 0.9 % IV SOLN
2.0000 g | INTRAVENOUS | Status: DC
  Administered 2020-02-14: 2 g via INTRAVENOUS
  Filled 2020-02-13: qty 2

## 2020-02-13 NOTE — Progress Notes (Signed)
PHARMACY NOTE:  ANTIMICROBIAL RENAL DOSAGE ADJUSTMENT  Current antimicrobial regimen includes a mismatch between antimicrobial dosage and estimated renal function.  As per policy approved by the Pharmacy & Therapeutics and Medical Executive Committees, the antimicrobial dosage will be adjusted accordingly.  Current antimicrobial dosage:  Cefepime 1g IV every 24 hours  Indication: UTI  Renal Function:  Estimated Creatinine Clearance: 31.7 mL/min (A) (by C-G formula based on SCr of 1.92 mg/dL (H)). []      On intermittent HD, scheduled: []      On CRRT    Antimicrobial dosage has been changed to:  Cefepime 2g IV every 24 hours given CrCl now >30 mL/min after BMP this afternoon   Additional comments:   Thank you for allowing pharmacy to be a part of this patient's care.  , PharmD, BCCCP Clinical Pharmacist  Phone: [phone]  Please check AMION for all [facility] phone numbers After 10:00 PM, call Main Pharmacy [phone] 02/13/2020 8:36 PM

## 2020-02-13 NOTE — Progress Notes (Signed)
Patient was placed back on BIPAP due to increased work of breathing and SAT's in the 70's. Patient's daughter states that she was trying to help patient with the bed pan when the patient desaturated. SAT's immediately improved after being placed on BIPAP. SAT's are now reading at 93% and the patient's work of breathing has improved. CCM was made aware.

## 2020-02-13 NOTE — Progress Notes (Signed)
Pt is complaining of new onset chest pain that feels like "pressure" on the left side. Also states she feels more SOB. Pt has increased O2 needs from 5L to 15L. Notified Dr. Everardo All and orders for STAT EKG and Troponin received. Will continue to monitor.

## 2020-02-13 NOTE — Progress Notes (Signed)
Progress Note  Patient Name: Leslie Alexander Date of Encounter: 02/13/2020  Primary Cardiologist: Prentice Docker, MD   Subjective   Feels better, off of bipap  Inpatient Medications    Scheduled Meds: . allopurinol  100 mg Oral Daily  . carbidopa-levodopa  1 tablet Oral BID  . chlorhexidine  15 mL Mouth Rinse BID  . Chlorhexidine Gluconate Cloth  6 each Topical Q0600  . DULoxetine  60 mg Oral Daily  . levothyroxine  100 mcg Oral Q0600  . mouth rinse  15 mL Mouth Rinse q12n4p  . potassium chloride  40 mEq Oral BID   Continuous Infusions: . ceFEPime (MAXIPIME) IV Stopped (02/13/20 0507)  . DOBUTamine 2.5 mcg/kg/min (02/13/20 1600)  . furosemide Stopped (02/13/20 1406)   PRN Meds: traZODone   Vital Signs    Vitals:   02/13/20 1345 02/13/20 1400 02/13/20 1500 02/13/20 1600  BP: 119/70 123/61 125/62 120/60  Pulse: 93 96 (!) 102 100  Resp: (!) 23 19 (!) 21 18  Temp:   97.8 F (36.6 C)   TempSrc:   Axillary   SpO2: 93% 96% 94% 98%  Weight:      Height:        Intake/Output Summary (Last 24 hours) at 02/13/2020 1623 Last data filed at 02/13/2020 1600 Gross per 24 hour  Intake 1241.28 ml  Output 2600 ml  Net -1358.72 ml   Last 3 Weights 02/13/2020 02/12/2020 07/10/2019  Weight (lbs) 255 lb 11.7 oz 257 lb 11.5 oz 248 lb  Weight (kg) 116 kg 116.9 kg 112.492 kg      Telemetry    afib - Personally Reviewed  ECG    No new - Personally Reviewed  Physical Exam   GEN: No acute distress.   Neck: jvp challenging to assess Cardiac: iRRR, no murmurs, rubs, distant heart sounds Respiratory: Crackles anterior bases GI: Soft, nontender, non-distended  MS: diffuse 2+ edema; No deformity. Neuro:  Nonfocal  Psych: Normal affect   Labs    High Sensitivity Troponin:   Recent Labs  Lab 03/11/2020 2335 02/12/20 0252 02/12/20 0931  TROPONINIHS 15 15 15       Chemistry Recent Labs  Lab 02/12/20 0931 02/12/20 0931 02/12/20 1444 02/12/20 1715 02/13/20 0846    NA 133*   < > 132* 133* 137  K 5.6*   < > 4.8 4.4 3.8  CL 101   < > 101 101 102  CO2 17*   < > 20* 21* 20*  GLUCOSE 152*   < > 170* 165* 133*  BUN 61*   < > 65* 66* 66*  CREATININE 2.76*   < > 2.59* 2.57* 2.15*  CALCIUM 8.8*   < > 8.6* 8.5* 8.5*  PROT 5.9*  --   --  5.8* 6.3*  ALBUMIN 2.4*  --   --  2.4* 2.5*  AST 278*  --   --  246* 305*  ALT 189*  --   --  169* 261*  ALKPHOS 543*  --   --  478* 505*  BILITOT 2.5*  --   --  1.6* 1.9*  GFRNONAA 16*   < > 17* 17* 22*  GFRAA 19*   < > 20* 20* 25*  ANIONGAP 15   < > 11 11 15    < > = values in this interval not displayed.     Hematology Recent Labs  Lab Mar 11, 2020 2335 03-11-20 2335 02/12/20 0558 02/12/20 0819 02/13/20 0846  WBC 18.9*  --  17.6*  --  17.5*  RBC 4.65  --  4.32  --  4.03  HGB 14.1   < > 12.9 12.6 12.1  HCT 45.3   < > 42.4 37.0 37.6  MCV 97.4  --  98.1  --  93.3  MCH 30.3  --  29.9  --  30.0  MCHC 31.1  --  30.4  --  32.2  RDW 17.7*  --  17.8*  --  17.3*  PLT 254  --  258  --  177   < > = values in this interval not displayed.    BNP Recent Labs  Lab 02/12/20 0531 02/13/20 0846  BNP 1,448.7* 503.9*     DDimer No results for input(s): DDIMER in the last 168 hours.   Radiology    DG Abd 1 View  Result Date: 02/12/2020 CLINICAL DATA:  Constipation EXAM: ABDOMEN - 1 VIEW COMPARISON:  None. FINDINGS: There is air-filled dilated loops of bowel measuring up to 7 cm in transverse dimension, which could represent the transverse colon. Air is seen down to the level the rectum. A small amount of retained contrast seen within the stomach and proximal small bowel. IMPRESSION: Findings suggestive of partial bowel obstruction versus ileus. Electronically Signed   By: Jonna Clark M.D.   On: 02/12/2020 06:27   US RENAL  Result Date: 02/12/2020 CLINICAL DATA:  79 year old female with acute renal insufficiency. EXAM: RENAL / URINARY TRACT ULTRASOUND COMPLETE COMPARISON:  Abdominal ultrasound dated 02/05/2020.  FINDINGS: Evaluation is limited due to patient's body habitus. Right Kidney: Renal measurements: 9.3 x 5.0 x 4.4 cm = volume: 107 mL. Normal echogenicity. No hydronephrosis or shadowing stone. Left Kidney: Renal measurements: 8.7 x 5.6 x 4.6 cm = volume: 118 mL. Normal echogenicity. No hydronephrosis or shadowing stone. The stone seen in the inferior pole of the left kidney on the prior ultrasound is not well visualized on today's exam. Bladder: The urinary bladder is predominantly decompressed around a Foley catheter. Other: None. IMPRESSION: No hydronephrosis or shadowing stone. Electronically Signed   By: Elgie Collard M.D.   On: 02/12/2020 19:24   DG CHEST PORT 1 VIEW  Result Date: 02/12/2020 CLINICAL DATA:  Shortness of breath EXAM: PORTABLE CHEST 1 VIEW COMPARISON:  02/10/2020 FINDINGS: Cardiomegaly. Bilateral airspace disease, slightly improved since prior study. No effusions. Right PICC line is unchanged. IMPRESSION: Cardiomegaly, diffuse bilateral airspace disease with slight improvement since prior study. This could reflect edema or infection. Electronically Signed   By: Charlett Nose M.D.   On: 02/12/2020 03:51   ECHOCARDIOGRAM COMPLETE BUBBLE STUDY  Result Date: 02/12/2020    ECHOCARDIOGRAM REPORT   Patient Name:   Leslie Alexander Date of Exam: 02/12/2020 Medical Rec #:  [id number]         Height:       65.0 in Accession #:    [id number]        Weight:       257.7 lb Date of Birth:  1941-06-22         BSA:          2.203 m Patient Age:    78 years          BP:           133/64 mmHg Patient Gender: F                 HR:           88 bpm. Exam Location:  Inpatient Procedure: 2D Echo Indications:  cardiogenic shock  History:        Patient has prior history of Echocardiogram examinations, most                 recent 01/12/2019. CHF, CAD, Arrythmias:Atrial Fibrillation; Risk                 Factors:Dyslipidemia and Former Smoker. Pulmonary hypertension.  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 restricted mobility IMPRESSIONS  1. Left ventricular ejection fraction, by estimation, is 35 to 40%. The left ventricle has severely decreased function. The left ventricle demonstrates global hypokinesis. There is moderate left ventricular hypertrophy. Left ventricular diastolic function could not be evaluated. Left ventricular diastolic parameters are indeterminate. There is the interventricular septum is flattened in systole and diastole, consistent with right ventricular pressure and volume overload. Septal dyssynchrony is significant.  2. Right ventricular systolic function is moderately reduced. The right ventricular size is moderately enlarged. There is moderately elevated pulmonary artery systolic pressure. The estimated right ventricular systolic pressure is 62.6 mmHg.  3. Left atrial size was moderately dilated.  4. Right atrial size was severely dilated.  5. The mitral valve is abnormal. Mild mitral valve regurgitation.  6. The aortic valve is abnormal. Aortic valve regurgitation is trivial. Mild aortic valve sclerosis is present, with no evidence of aortic valve stenosis.  7. The inferior vena cava is dilated in size with <50% respiratory variability, suggesting right atrial pressure of 15 mmHg. Comparison(s): 01/12/19: LVEF 40-45%. Conclusion(s)/Recommendation(s): Study reviewed with Dr. Margaretann Loveless, she is in agreement. FINDINGS  Left Ventricle: Left ventricular ejection fraction, by estimation, is 35 to 40%. The left ventricle has moderately decreased function. The left ventricle demonstrates global hypokinesis. The left ventricular internal cavity size was normal in size. There is moderate left ventricular hypertrophy. The interventricular septum is flattened in systole and diastole, consistent with right ventricular pressure and volume  overload. Left ventricular diastolic function could not be evaluated due to atrial fibrillation. Left ventricular diastolic parameters are indeterminate. Right Ventricle: The right ventricular size is moderately enlarged. No increase in right ventricular wall thickness. Right ventricular systolic function is moderately reduced. There is moderately elevated pulmonary artery systolic pressure. The tricuspid  regurgitant velocity is 2.92 m/s, and with an assumed right atrial pressure of 15 mmHg, the estimated right ventricular systolic pressure is 94.8 mmHg. Left Atrium: Left atrial size was moderately dilated. Right Atrium: Right atrial size was severely dilated. Pericardium: Trivial pericardial effusion is present. The pericardial effusion is circumferential. Mitral Valve: The mitral valve is abnormal. Mild mitral valve regurgitation. Tricuspid Valve: The tricuspid valve is grossly normal. Tricuspid valve regurgitation is mild. Aortic Valve: The aortic valve is abnormal. Aortic valve regurgitation is trivial. Mild aortic valve sclerosis is present, with no evidence of aortic valve stenosis. Pulmonic Valve: The pulmonic valve was grossly normal. Pulmonic valve regurgitation is trivial. Aorta: The aortic root was not well visualized. Venous: The inferior vena cava is dilated in size with less than 50% respiratory variability, suggesting right atrial pressure of 15 mmHg. IAS/Shunts: The interatrial septum was not well visualized.  LEFT VENTRICLE PLAX 2D LVIDd:         4.80 cm LVIDs:         3.15 cm LV PW:         1.40 cm LV IVS:  0.90 cm LVOT diam:     1.90 cm LV SV:         36 LV SV Index:   16 LVOT Area:     2.84 cm  LEFT ATRIUM           Index       RIGHT ATRIUM           Index LA diam:      4.90 cm 2.22 cm/m  RA Area:     22.30 cm LA Vol (A4C): 32.6 ml 14.80 ml/m RA Volume:   57.20 ml  25.96 ml/m  AORTIC VALVE LVOT Vmax:   67.20 cm/s LVOT Vmean:  53.700 cm/s LVOT VTI:    0.128 m  AORTA Ao Root diam: 3.10  cm MITRAL VALVE               TRICUSPID VALVE MV Area (PHT): 1.89 cm    TR Peak grad:   34.1 mmHg MV Decel Time: 401 msec    TR Vmax:        292.00 cm/s MV E velocity: 81.80 cm/s                            SHUNTS                            Systemic VTI:  0.13 m                            Systemic Diam: 1.90 cm Zoila Shutter MD Electronically signed by Zoila Shutter MD Signature Date/Time: 02/12/2020/3:03:26 PM    Final    VAS [place] UPPER EXTREMITY VENOUS DUPLEX  Result Date: 02/13/2020 UPPER VENOUS STUDY  Indications: Swelling, and bruising Limitations: Body habitus, bandages, line and poor ultrasound/tissue interface. Comparison Study: No prior exam. Performing Technologist: Kennedy Bucker ARDMS, RVT  Examination Guidelines: A complete evaluation includes B-mode imaging, spectral Doppler, color Doppler, and power Doppler as needed of all accessible portions of each vessel. Bilateral testing is considered an integral part of a complete examination. Limited examinations for reoccurring indications may be performed as noted.  Right Findings: +----------+------------+---------+-----------+----------+---------------------+ RIGHT     CompressiblePhasicitySpontaneousProperties       Summary        +----------+------------+---------+-----------+----------+---------------------+ IJV           Full       Yes       Yes                                    +----------+------------+---------+-----------+----------+---------------------+ Subclavian    Full       Yes       Yes               IV line visualized   +----------+------------+---------+-----------+----------+---------------------+ Axillary      Full       Yes       Yes               IV line visualized   +----------+------------+---------+-----------+----------+---------------------+ Brachial      Full       Yes       Yes               IV line visualized   +----------+------------+---------+-----------+----------+---------------------+  Radial  Full                                  visualized with color +----------+------------+---------+-----------+----------+---------------------+ Ulnar         Full                                  visualized with color +----------+------------+---------+-----------+----------+---------------------+ Cephalic      Full                                                        +----------+------------+---------+-----------+----------+---------------------+ Basilic                                                Not visualized     +----------+------------+---------+-----------+----------+---------------------+  Summary:  Right: No evidence of deep vein thrombosis in the upper extremity. No evidence of superficial vein thrombosis in the upper extremity. Basilic vein not visualized. Limited visualization of radial and ulnar veins due to small caliber.  *See table(s) above for measurements and observations.    Preliminary     Cardiac Studies   Patient Profile     79 y.o. female with h/o moderate pulm HTN and associated R heart dysfunction (mildly reduced RV systolic function on TTE done 2-97-98), mild non-obst CAD, normal LV systolic function on TTE 02-07-20, OSA, HTN, chronic/persistent afib, morbid obesity, stage 3 CKD transferred from [facility] due to SOB and edema.  Assessment & Plan    1. Pulmonary HTN/RV dysfunction with HFrEF: Improving on dobutamine infusion and aggressive diuresis. Continue. Discussed with Dr. Everardo All in ICU. I/O 2.15 L UOP yesterday, approximately 1 L negative for admission. - respiratory status improving, off of bipap  2. Acute hypoxic respiratory failure: -Continued management per PCCM   3. Persistent atrial fibrillation: -PTA on Toprol XL 50mg  >>>discharge summary reported Eliquis however not on PTA -No AC in the setting of elevated INR  -Monitor for acute s/s of bleeding>>CBC  4. CAD: -Nonobstructive CAD per [facility] in 12/2018 -No  anginal symptoms -Continue ASA, BB  -No statin in the setting of elevated LFTs  5. HTN: -Stable, 124/63>112/64>110/65 -Hold antihypertensives for now and monitor closely   6. Acute on chronic kidney disease/acute liver injury: -Creatinine, 2.15 improved  -AST/ALT (232/190) elevated on admission with INR at 5>>6.2, now 2.6      For questions or updates, please contact [facility] Please consult [email or web address] for contact info under        Signed, 01/2019, MD  02/13/2020, 4:23 PM    CRITICAL CARE TIME: I have spent a total of 31 minutes with patient reviewing hospital notes, telemetry, EKGs, labs and examining the patient as well as establishing an assessment and plan that was discussed with the patient. > 50% of time was spent in direct patient care. The patient is critically ill with multi-organ system failure on inotropic support and requires high complexity decision making for assessment and support, frequent evaluation and titration of therapies, application of advanced monitoring technologies and extensive interpretation of multiple databases.

## 2020-02-13 NOTE — Progress Notes (Signed)
[facility] Progress Note   Subjective:   On [place] this AM feeling better. Sister bedside I/Os 650 / 2150 yesterday with lasix 120 IV x2  in PM after 80 earlier in the day dobutamine 2.5  Objective Vitals:   02/13/20 0600 02/13/20 0700 02/13/20 0800 02/13/20 0900  BP: (!) 117/58 118/66 118/64 (!) 117/107  Pulse: 90 84 97 92  Resp: 17 16 15 19   Temp:  97.6 F (36.4 C)    TempSrc:  Axillary    SpO2: 97% 99% 98% 97%  Weight:      Height:       Physical Exam General: elderly, obese, comfortable on [place] Heart: RRR, no rub Lungs: dec BS bases, normal WOB at rest Abdomen: soft, mod distended, nontender Extremities: 1+ pitting diffuse edema  Additional Objective Labs: Basic Metabolic Panel: Recent Labs  Lab 02/12/20 0558 02/12/20 0819 02/12/20 0931 02/12/20 1444 02/12/20 1715  NA 132*   < > 133* 132* 133*  K 6.1*   < > 5.6* 4.8 4.4  CL 98   < > 101 101 101  CO2 13*   < > 17* 20* 21*  GLUCOSE 111*   < > 152* 170* 165*  BUN 58*   < > 61* 65* 66*  CREATININE 2.90*   < > 2.76* 2.59* 2.57*  CALCIUM 9.2   < > 8.8* 8.6* 8.5*  PHOS 6.9*  --   --   --   --    < > = values in this interval not displayed.   Liver Function Tests: Recent Labs  Lab 02/12/20 0558 02/12/20 0931 02/12/20 1715  AST 232* 278* 246*  ALT 190* 189* 169*  ALKPHOS 634* 543* 478*  BILITOT 2.8* 2.5* 1.6*  PROT 6.4* 5.9* 5.8*  ALBUMIN 2.6* 2.4* 2.4*   Recent Labs  Lab 02/12/20 0531  LIPASE 35   CBC: Recent Labs  Lab 02-18-20 2335 02/12/20 0558 02/12/20 0819  WBC 18.9* 17.6*  --   NEUTROABS 16.1*  --   --   HGB 14.1 12.9 12.6  HCT 45.3 42.4 37.0  MCV 97.4 98.1  --   PLT 254 258  --    Blood Culture    Component Value Date/Time   SDES BLOOD RIGHT HAND 02/12/2020 0457   SPECREQUEST  02/12/2020 0457    BOTTLES DRAWN AEROBIC ONLY Blood Culture adequate volume   CULT NO GROWTH < 24 HOURS 02/12/2020 0457   REPTSTATUS PENDING 02/12/2020 0457    Cardiac Enzymes: No results  for input(s): CKTOTAL, CKMB, CKMBINDEX, TROPONINI in the last 168 hours. CBG: Recent Labs  Lab 02/12/20 1512 02/12/20 1926 02/12/20 2323 02/13/20 0325 02/13/20 0819  GLUCAP 133* 139* 144* 130* 147*   Iron Studies: No results for input(s): IRON, TIBC, TRANSFERRIN, FERRITIN in the last 72 hours. @lablastinr3 @ Studies/Results: DG Abd 1 View  Result Date: 02/12/2020 CLINICAL DATA:  Constipation EXAM: ABDOMEN - 1 VIEW COMPARISON:  None. FINDINGS: There is air-filled dilated loops of bowel measuring up to 7 cm in transverse dimension, which could represent the transverse colon. Air is seen down to the level the rectum. A small amount of retained contrast seen within the stomach and proximal small bowel. IMPRESSION: Findings suggestive of partial bowel obstruction versus ileus. Electronically Signed   By: M.D.   On: 02/12/2020 06:27   Jonna Clark RENAL  Result Date: 02/12/2020 CLINICAL DATA:  79 year old female with acute renal insufficiency. EXAM: RENAL / URINARY TRACT ULTRASOUND COMPLETE COMPARISON:  Abdominal ultrasound  dated 02/05/2020. FINDINGS: Evaluation is limited due to patient's body habitus. Right Kidney: Renal measurements: 9.3 x 5.0 x 4.4 cm = volume: 107 mL. Normal echogenicity. No hydronephrosis or shadowing stone. Left Kidney: Renal measurements: 8.7 x 5.6 x 4.6 cm = volume: 118 mL. Normal echogenicity. No hydronephrosis or shadowing stone. The stone seen in the inferior pole of the left kidney on the prior ultrasound is not well visualized on today's exam. Bladder: The urinary bladder is predominantly decompressed around a Foley catheter. Other: None. IMPRESSION: No hydronephrosis or shadowing stone. Electronically Signed   By: Elgie Collard M.D.   On: 02/12/2020 19:24   DG CHEST PORT 1 VIEW  Result Date: 02/12/2020 CLINICAL DATA:  Shortness of breath EXAM: PORTABLE CHEST 1 VIEW COMPARISON:  02/10/2020 FINDINGS: Cardiomegaly. Bilateral airspace disease, slightly improved  since prior study. No effusions. Right PICC line is unchanged. IMPRESSION: Cardiomegaly, diffuse bilateral airspace disease with slight improvement since prior study. This could reflect edema or infection. Electronically Signed   By: Charlett Nose M.D.   On: 02/12/2020 03:51   ECHOCARDIOGRAM COMPLETE BUBBLE STUDY  Result Date: 02/12/2020    ECHOCARDIOGRAM REPORT   Patient Name:   [name] Date of Exam: 02/12/2020 Medical Rec #:  [id number]         Height:       65.0 in Accession #:    [id number]        Weight:       257.7 lb Date of Birth:  May 14, 1941         BSA:          2.203 m Patient Age:    78 years          BP:           133/64 mmHg Patient Gender: F                 HR:           88 bpm. Exam Location:  Inpatient Procedure: 2D Echo Indications:    cardiogenic shock  History:        Patient has prior history of Echocardiogram examinations, most                 recent 01/12/2019. CHF, CAD, Arrythmias:Atrial Fibrillation; Risk                 Factors:Dyslipidemia and Former Smoker. Pulmonary hypertension.  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 restricted mobility IMPRESSIONS  1. Left ventricular ejection fraction, by estimation, is 35 to 40%. The left ventricle has severely decreased function. The left ventricle demonstrates global hypokinesis. There is moderate left ventricular hypertrophy. Left ventricular diastolic function could not be evaluated. Left ventricular diastolic parameters are indeterminate. There is the interventricular septum is flattened in systole and diastole, consistent with right ventricular pressure and volume overload. Septal dyssynchrony is significant.  2. Right ventricular systolic function is moderately reduced. The right ventricular size is moderately enlarged. There is moderately elevated pulmonary  artery systolic pressure. The estimated right ventricular systolic pressure is 49.1 mmHg.  3. Left atrial size was moderately dilated.  4. Right atrial size was severely dilated.  5. The mitral valve is abnormal. Mild mitral valve regurgitation.  6. The aortic valve is abnormal. Aortic valve regurgitation is trivial. Mild aortic valve sclerosis is present, with  no evidence of aortic valve stenosis.  7. The inferior vena cava is dilated in size with <50% respiratory variability, suggesting right atrial pressure of 15 mmHg. Comparison(s): 01/12/19: LVEF 40-45%. Conclusion(s)/Recommendation(s): Study reviewed with Dr. Jacques Navy, she is in agreement. FINDINGS  Left Ventricle: Left ventricular ejection fraction, by estimation, is 35 to 40%. The left ventricle has moderately decreased function. The left ventricle demonstrates global hypokinesis. The left ventricular internal cavity size was normal in size. There is moderate left ventricular hypertrophy. The interventricular septum is flattened in systole and diastole, consistent with right ventricular pressure and volume overload. Left ventricular diastolic function could not be evaluated due to atrial fibrillation. Left ventricular diastolic parameters are indeterminate. Right Ventricle: The right ventricular size is moderately enlarged. No increase in right ventricular wall thickness. Right ventricular systolic function is moderately reduced. There is moderately elevated pulmonary artery systolic pressure. The tricuspid  regurgitant velocity is 2.92 m/s, and with an assumed right atrial pressure of 15 mmHg, the estimated right ventricular systolic pressure is 49.1 mmHg. Left Atrium: Left atrial size was moderately dilated. Right Atrium: Right atrial size was severely dilated. Pericardium: Trivial pericardial effusion is present. The pericardial effusion is circumferential. Mitral Valve: The mitral valve is abnormal. Mild mitral valve regurgitation. Tricuspid Valve: The  tricuspid valve is grossly normal. Tricuspid valve regurgitation is mild. Aortic Valve: The aortic valve is abnormal. Aortic valve regurgitation is trivial. Mild aortic valve sclerosis is present, with no evidence of aortic valve stenosis. Pulmonic Valve: The pulmonic valve was grossly normal. Pulmonic valve regurgitation is trivial. Aorta: The aortic root was not well visualized. Venous: The inferior vena cava is dilated in size with less than 50% respiratory variability, suggesting right atrial pressure of 15 mmHg. IAS/Shunts: The interatrial septum was not well visualized.  LEFT VENTRICLE PLAX 2D LVIDd:         4.80 cm LVIDs:         3.15 cm LV PW:         1.40 cm LV IVS:        0.90 cm LVOT diam:     1.90 cm LV SV:         36 LV SV Index:   16 LVOT Area:     2.84 cm  LEFT ATRIUM           Index       RIGHT ATRIUM           Index LA diam:      4.90 cm 2.22 cm/m  RA Area:     22.30 cm LA Vol (A4C): 32.6 ml 14.80 ml/m RA Volume:   57.20 ml  25.96 ml/m  AORTIC VALVE LVOT Vmax:   67.20 cm/s LVOT Vmean:  53.700 cm/s LVOT VTI:    0.128 m  AORTA Ao Root diam: 3.10 cm MITRAL VALVE               TRICUSPID VALVE MV Area (PHT): 1.89 cm    TR Peak grad:   34.1 mmHg MV Decel Time: 401 msec    TR Vmax:        292.00 cm/s MV E velocity: 81.80 cm/s                            SHUNTS                            Systemic VTI:  0.13 m                            Systemic Diam: 1.90 cm Lyman Bishop MD Electronically signed by Lyman Bishop MD Signature Date/Time: 02/12/2020/3:03:26 PM    Final    Medications: . ceFEPime (MAXIPIME) IV Stopped (02/13/20 0507)  . DOBUTamine 2.5 mcg/kg/min (02/13/20 0900)   . allopurinol  100 mg Oral Daily  . carbidopa-levodopa  1 tablet Oral BID  . chlorhexidine  15 mL Mouth Rinse BID  . Chlorhexidine Gluconate Cloth  6 each Topical Q0600  . DULoxetine  60 mg Oral Daily  . levothyroxine  100 mcg Oral Q0600  . mouth rinse  15 mL Mouth Rinse q12n4p  . vancomycin variable dose per unstable  renal function (pharmacist dosing)   Does not apply See admin instructions   Leslie Alexander is a 79 y.o. female with history of hypertension, obesity, OSA, CKD stage III, A. fib, pulmonary hypertension associated with right heart dysfunction, nonobstructive CAD, transferred from [facility], seen as a consultation at the request of Dr. Loanne Drilling for acute kidney injury.  Assessment/Plan: #Acute kidney injury on CKD III due to poor renal perfusion related with cardiogenic shock.  UA with possible UTI and has protein.  UP/C 0.30.  Renal ultrasound 3/22 normal.  Appears to be responding to diuretics and inotropes currently and no indications for RRT.  Will continue to follow.   #Hyperkalemia: resolved as of last PM labs, this AMs are pending.  Has rec'd lokelma and lasix.   #Lactic acidosis due to shock: Lactic acid level improved to 2.7 yesterday, continue inotrope and current management.  #Cardiogenic shock/biventricular heart failure: With moderate pulmonary hypertension.  Cardiology following.  Diuretics and dobutamine as above.  Plan for echo and cardiac cath when stable > TTE done but read pending.  #Acute respiratory failure with hypoxia: Chest x-ray with pulmonary edema.  On BiPAP.  Volume management with diuretics -- making progress.  #Hypervolemic hyponatremia: Mild, improving with diuresis.  CTM.   #Coagulopathy and acute liver failure: Per primary team.  Received FFP.  Thought to be congestive physiology.   **Hematuria:  Recheck when foley out  Will continue to follow, page with questions.   Jannifer Hick MD 02/13/2020, 9:18 AM  [facility] Pager: [phone]

## 2020-02-13 NOTE — Progress Notes (Signed)
Right upper extremity venous duplex exam completed.  Preliminary results can be found under CV proc under chart review.  02/13/2020 3:03 PM  Ragan Reale, K., RDMS, RVT

## 2020-02-13 NOTE — Progress Notes (Signed)
I have taken an interval history, reviewed the chart and examined the patient. I agree with the Advanced Practitioner's note, impression, and recommendations as outlined.   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  S: Off BiPAP this morning.  No respiratory distress.  Awake and alert and oriented x3  Blood pressure (!) 106/49, pulse 97, temperature 97.6 F (36.4 C), temperature source Axillary, resp. rate (!) 22, height 5\' 5"  (1.651 m), weight 116 kg, SpO2 98 %.  On exam, obese female laying in bed.  No respiratory distress.  Lungs with bibasilar crackles, no wheezing.  Heart is irregular rate and rhythm.  Lower extremities with 2+ pitting edema which is improved compared to yesterday, warm to touch.  CXR 3/22-bilateral alveolar airspace disease suggestive of pulmonary edema, no effusions  Assessment/Plan Cardiogenic shock secondary to biventricular heart failure. Appreciate Cardiology involvement. Continue dobutamine gtt. Repeat aggressive diuresis today.   Acute respiratory failure secondary to pulmonary edema. BiPAP PRN. Wean supplemental O2 for goal SpO2 90-94%. Diuresis as above.  AKI secondary to cardiorenal syndrome Improving with diuresis. Great UOP.   Elevated hepatitis and coagulopathy secondary to hepatic congestion, hypotension Continue to trend LFTs and INR as I expect labs are lagging behind clinical improvement. No additional FFP needed as no anticipated procedures.   DVT ppx: SCDs Code status: Full Dispo: ICU  The patient is critically ill with multiple organ systems failure and requires high complexity decision making for assessment and support, frequent evaluation and titration of therapies, application of advanced monitoring technologies and extensive interpretation of multiple databases.    Critical Care Time devoted to patient care services described in this note is 35 Minutes. This time reflects time of care of this signee Dr. . This critical care time does not reflect procedure time, or teaching time or supervisory time of PA/NP/Med student/Med Resident etc but could involve care discussion time.  Mechele Collin, M.D. [facility] Pulmonary/Critical Care Medicine 02/13/2020 11:35 AM   Please see Amion for pager number to reach on-call Pulmonary and Critical Care Team.

## 2020-02-13 NOTE — Progress Notes (Signed)
NAME:  Leslie Alexander, MRN:  [id number], DOB:  [birth year], LOS: 2 ADMISSION DATE:  02/06/2020, CONSULTATION DATE:  02/12/2020 REFERRING MD:  Hospitalist, CHIEF COMPLAINT:  Lactic acidosis   Brief History   79 yo F with a history of pulmonary hypertension, diastolic heart failure, non-obstructive CAD, OSA, HTN, persistent Afib, obesity, and stage 3 CKD who presented to [facility] 02/01/20 for shortness of breath and swelling, thought to be due to heart failure.  She was also being treated for a klebsiella UTI and possible pneumonia with ceftriaxone and azithromycin.   Patient was initially diuresed aggressively with IV lasix, but then developed hyponatremia, and diuresis was stopped and she was given normal saline.  She was also having issues with hyperkalemia over there and was given kayexalate.   History of present illness   Family wanted her transferred to [facility].  Over at [facility], she was having elevated LFTs, particularly the alk phos.  This evening, patient had an episode of hypoglycemia, which improved with dextrose.  Her labs returned with significant abnormalities including a significant lactic acidosis with lactate of 9, bicarb of 10, hyperkalemia, and INR of 5.  When seen at the bedside, patient is lethargic but overall but alert and oriented, satting 97% on 6L.  She feels unwell, but not in any pain except for mild diffuse abdominal pain, which she attributes to not having a bowel movement. Denies any chest pain and thinks his breathing is "alright."  Past Medical History   Past Medical History:  Diagnosis Date   Atrial fibrillation (HCC)    CAD (coronary artery disease)    a. mild nonobstructive CAD by cath in 11/2014. b. repeat cath in 12/2018 showing minor CAD; noted to have moderate pulmonary HTN   Chronic combined systolic (congestive) and diastolic (congestive) heart failure ([place])    a. EF reduced to 40-45% by echo in 12/2018   Depression    Essential hypertension,  benign    GERD (gastroesophageal reflux disease)    History of breast cancer    Hx of atrial fibrillation, no current medication    converted out of A. Fib with cardioversion   Hypothyroidism    Left bundle branch block    negative Lexiscan Myoview; EF 56%, 3/13    Lumbar disc disease    Meralgia paresthetica, right    Mixed hyperlipidemia    Morbid obesity ([place])    Nonalcoholic fatty liver disease    OSA (obstructive sleep apnea)    CPAP machine   Pulmonary hypertension (HCC)    RVSP 55-60 mm mercury     Significant Hospital Events   3/21: Transferred from [facility]  3/22: Transferred to ICU for lactic acidosis  Consults:  Cardiology  Procedures:    Significant Diagnostic Tests:  RUQ doppler 3/9: patent portal vein, liver within normal limits  CT A/P w/o contrast: Nonobstructive left nephrolithiasis, fat stranding around pancreas (per [facility], this is chronic)  BNP: 3838 3/15 -->5965 3/21 Na: 3/15 125-->127-->120>123>125 (3/17)-->130 (3/18) --> 128 (3/20)--> 130 (3/21) Cr: 3/15 1.3-->1.58-->1.77-->1.56--> 0.9 --> 1.37 (3/20) -->1.63 (3/21)  ECHO 3/22 > 1. Left ventricular ejection fraction, by estimation, is 35 to 40%. The  left ventricle has severely decreased function. The left ventricle  demonstrates global hypokinesis. There is moderate left ventricular  hypertrophy. Left ventricular diastolic  function could not be evaluated. Left ventricular diastolic parameters are  indeterminate. There is the interventricular septum is flattened in  systole and diastole, consistent with right ventricular pressure  and  volume overload. Septal dyssynchrony is  significant.  2. Right ventricular systolic function is moderately reduced. The right  ventricular size is moderately enlarged. There is moderately elevated  pulmonary artery systolic pressure. The estimated right ventricular  systolic pressure is 83.6 mmHg.  3. Left atrial size was  moderately dilated.  4. Right atrial size was severely dilated.  5. The mitral valve is abnormal. Mild mitral valve regurgitation.  6. The aortic valve is abnormal. Aortic valve regurgitation is trivial.  Mild aortic valve sclerosis is present, with no evidence of aortic valve  stenosis.  7. The inferior vena cava is dilated in size with <50% respiratory  variability, suggesting right atrial pressure of 15 mmHg.   Renal ultrasound 3/22 > No hydronephrosis or shadowing stone.  Micro Data:  OSH Urine culture 3/11 > Klebsiella (pan-susceptible), 10,000 CFU  Blood cultures 3/11 > negative  COVID 3/21 > Negative  Blood cultures 3/22 >   Antimicrobials:  S/p Ceftriaxone and azithromycin at OSH  Interim history/subjective:  Lying in bed on BIPAP, she reports no acute complaints, RN stated unable to liberate from BIPAP to [place] overnight.  Objective   Blood pressure (!) 117/58, pulse 90, temperature 97.7 F (36.5 C), temperature source Axillary, resp. rate 17, height _0  (1.651 m), weight 116 kg, SpO2 97 %.    FiO2 (%):  [30 %-50 %] 40 %   Intake/Output Summary (Last 24 hours) at 02/13/2020 0731 Last data filed at 02/13/2020 0600 Gross per 24 hour  Intake 650.18 ml  Output 2150 ml  Net -1499.82 ml   Filed Weights   02/12/20 0501 02/13/20 0420  Weight: 116.9 kg 116 kg    Examination: General: Chronically ill appearing elderly female on BIPAP, in NAD HEENT: [place]/AT, MM pink/Dry, PERRL, sclera nonicteric   Neuro: Will open eyes and respond to simple questions but will quickly fall back to sleep if no aroused  CV: Irregularly irregular rate and rhythm, no murmur, rubs, or gallops,  PULM:  Diminished air entry bilaterally, crackles to bases, no increased work of breathing, oxygen saturations 95-98 on 40% FiO2 on BIPAP  GI: soft, bowel sounds active in all 4 quadrants, non-tender, non-distended Extremities: warm/dry, no edema  Skin: no rashes or lesions  Resolved Hospital  Problem list   Hypoglycemia  Assessment & Plan:  79 yo woman with a history of pulmonary hypertension, diastolic heart failure, non-obstructive CAD, OSA, HTN, persistent Afib, obesity, and stage 3 CKD being transferred to the MICU for metabolic acidosis (namely lactic acidosis), liver injury, AKI on CKD, concern for cardiogenic shock.    Metabolic acidosis, lactic acidosis, improving  -ABG with pH of 7.27.  Placed on bipap to increase minute ventilation.  Suspect underlying cause could be cardiogenic shock causing a low-flow state, with prerenal AKI and hepatic congestion (patient is cool and wet) leading to liver dysfunction.   -Bicarb 13, lactate 9 >> 7 >> 2.7 Plan: Trial break from BIPAP  Continue to diurese as able, 2L urine output overnight  Broad spectrum antibiotics, de-escalate as able  Trend CMP   Cardiogenic shock secondary to biventricular heart failure  -Pulmonary edema on CXR, she is cold and wet on exam.  Signs of RV more than LV dysfunction on previous echo at [place]: Repeat ECHO as above Cardiology following Diurese as able  Continue on Dobutamine  Trend CMP  Acute liver injury  Coagulopathy  -INR 5.0 >> 6.2 >> 3.8 Plan: Trend LFT  Monitor INR  May need MRCP prior to discharge    Acute hypoxic respiratory failure:  - Likely from pulmonary edema  -Family and patient agreeable to intubation if needed   Plan: Currently remains on BIPAP, trial off today  Consider CT chest once stable  Trend ABG and CXR as needed  Aggressive diureses  Encourage adequate pulmonary hygiene  Follow cultures   AKI on CKD Hyperkalemia:   -Concern for cardiogenic shock Plan: Nephrology following, appreciate assistance  Closely monitor renal function and output  Trend CMP  Hyperkalemia resolved   Abdominal pain Constipation  Plan: Continue bowel regiment   Permanent A-fib Plan: Home Apixiban and beta blocker on hold  Continuous telemetry   Home Apixaban  and Beta blocker therapy on hold  Continuous telemetry   Parkinson's: Home medications include  levo-dopa, carbidopa Plan: Continue home meds   Hypothyroidism:  Plan: Continue home synthroid   Best practice:  Diet: NPO Pain/Anxiety/Delirium protocol (if indicated): n/a VAP protocol (if indicated): n/a DVT prophylaxis: hold while INR >2 GI prophylaxis: n/a Glucose control: glucose q2h for hypoglycemia Mobility: when able Code Status: Full Family Communication: Daughter updated at bedside 3/23 Disposition: 6M ICU  Labs   CBC: Recent Labs  Lab 02/09/2020 2335 02/12/20 0558 02/12/20 0819  WBC 18.9* 17.6*  --   NEUTROABS 16.1*  --   --   HGB 14.1 12.9 12.6  HCT 45.3 42.4 37.0  MCV 97.4 98.1  --   PLT 254 258  --     Basic Metabolic Panel: Recent Labs  Lab 02/10/2020 2335 01/22/2020 2335 02/12/20 0558 02/12/20 0819 02/12/20 0931 02/12/20 1444 02/12/20 1715  NA 132*   < > 132* 129* 133* 132* 133*  K 6.4*   < > 6.1* 5.6* 5.6* 4.8 4.4  CL 100  --  98  --  101 101 101  CO2 10*  --  13*  --  17* 20* 21*  GLUCOSE 66*  --  111*  --  152* 170* 165*  BUN 52*  --  58*  --  61* 65* 66*  CREATININE 2.36*  --  2.90*  --  2.76* 2.59* 2.57*  CALCIUM 9.5  --  9.2  --  8.8* 8.6* 8.5*  MG 2.1  --  2.0  --   --   --   --   PHOS  --   --  6.9*  --   --   --   --    < > = values in this interval not displayed.   GFR: Estimated Creatinine Clearance: 23.7 mL/min (A) (by C-G formula based on SCr of 2.57 mg/dL (H)). Recent Labs  Lab 02/13/2020 2335 02/12/20 0251 02/12/20 0252 02/12/20 0457 02/12/20 0558 02/12/20 0832  PROCALCITON  --   --  0.58  --   --   --   WBC 18.9*  --   --   --  17.6*  --   LATICACIDVEN  --  8.9*  --  9.2* 7.0* 2.7*    Liver Function Tests: Recent Labs  Lab 02/10/2020 2335 02/12/20 0558 02/12/20 0931 02/12/20 1715  AST 182* 232* 278* 246*  ALT 150* 190* 189* 169*  ALKPHOS 653* 634* 543* 478*  BILITOT 2.8* 2.8* 2.5* 1.6*  PROT 7.0 6.4* 5.9* 5.8*    ALBUMIN 2.8* 2.6* 2.4* 2.4*   Recent Labs  Lab 02/12/20 0531  LIPASE 35   No results for input(s): AMMONIA in the last 168 hours.  ABG    Component Value Date/Time  PHART 7.369 02/12/2020 0819   PCO2ART 25.0 (L) 02/12/2020 0819   PO2ART 47.0 (L) 02/12/2020 0819   HCO3 14.4 (L) 02/12/2020 0819   TCO2 15 (L) 02/12/2020 0819   ACIDBASEDEF 9.0 (H) 02/12/2020 0819   O2SAT 83.0 02/12/2020 0819     Coagulation Profile: Recent Labs  Lab 01/24/2020 2335 02/12/20 0558 02/12/20 1715  INR 5.0* 6.2* 3.8*    Cardiac Enzymes: No results for input(s): CKTOTAL, CKMB, CKMBINDEX, TROPONINI in the last 168 hours.  HbA1C: Hgb A1c MFr Bld  Date/Time Value Ref Range Status  12/19/2014 06:45 AM 6.5 (H) <5.7 % Final    Comment:    (NOTE)                                                                       According to the ADA Clinical Practice Recommendations for 2011, when HbA1c is used as a screening test:  >=6.5%   Diagnostic of Diabetes Mellitus           (if abnormal result is confirmed) 5.7-6.4%   Increased risk of developing Diabetes Mellitus References:Diagnosis and Classification of Diabetes Mellitus,Diabetes JASN,0539,76(BHALP 1):S62-S69 and Standards of Medical Care in         Diabetes - 2011,Diabetes FXTK,2409,73 (Suppl 1):S11-S61.     CBG: Recent Labs  Lab 02/12/20 1142 02/12/20 1512 02/12/20 1926 02/12/20 2323 02/13/20 0325  GLUCAP 143* 133* 139* 144* 130*    Review of Systems:   All other ROS negative except per HPI  Past Medical History  She,  has a past medical history of Atrial fibrillation ([place]), CAD (coronary artery disease), Chronic combined systolic (congestive) and diastolic (congestive) heart failure ([place]), Depression, Essential hypertension, benign, GERD (gastroesophageal reflux disease), History of breast cancer, atrial fibrillation, no current medication, Hypothyroidism, Left bundle branch block, Lumbar disc disease, Meralgia paresthetica, right,  Mixed hyperlipidemia, Morbid obesity ([place]), Nonalcoholic fatty liver disease, OSA (obstructive sleep apnea), and Pulmonary hypertension ([place]).   Surgical History    Past Surgical History:  Procedure Laterality Date   CATARACT EXTRACTION W/PHACO Left 12/18/2013   Procedure: CATARACT EXTRACTION PHACO AND INTRAOCULAR LENS PLACEMENT (IOC);  Surgeon: Tonny Branch, MD;  Location: [facility];  Service: Ophthalmology;  Laterality: Left;  CDE 12.70   CATARACT EXTRACTION, BILATERAL  04/19/2008   Dr. Geoffry Paradise   FEMORAL EXPLORATION  12/2003   RIGHT LATERAL FEMORAL CUTANEOUS NERVE/Dr. Eulis Manly SPUR SURGERY  07/2006   For spurs/Dr. Irving Shows   INCISION AND DRAINAGE OF WOUND  01/26/2012   Dr. Alphonzo Cruise   KNEE ARTHROSCOPY  10/2005   Left knee/from torn cartilage Dr. Garfield Cornea   LAPAROSCOPIC CHOLECYSTECTOMY  03/02/2011   Dr. Anthony Sar   LEFT HEART CATHETERIZATION WITH CORONARY ANGIOGRAM N/A 12/20/2014   Procedure: LEFT HEART CATHETERIZATION WITH CORONARY ANGIOGRAM;  Surgeon: Peter M Martinique, MD;  Location: [facility] CATH LAB;  Service: Cardiovascular;  Laterality: N/A;   MASTECTOMY, RADICAL Left 1989   With chemotherapy   PARS PLANA VITRECTOMY W/ REPAIR OF MACULAR HOLE  05/2008   Dr. Zadie Rhine   RIGHT/LEFT HEART CATH AND CORONARY ANGIOGRAPHY N/A 01/18/2019   Procedure: RIGHT/LEFT HEART CATH AND CORONARY ANGIOGRAPHY;  Surgeon: Martinique, Peter M, MD;  Location: [facility] LAB;  Service: Cardiovascular;  Laterality: N/A;  TOTAL KNEE ARTHROPLASTY Left 12/29/2011   Dr. Alphonzo Cruise   TUBAL LIGATION     BILATERAL     Social History   reports that she quit smoking about 32 years ago. Her smoking use included cigarettes. She started smoking about 54 years ago. She has a 5.00 pack-year smoking history. She has never used smokeless tobacco. She reports that she does not drink alcohol or use drugs.   Family History   Her family history includes Breast cancer in her sister; COPD in her mother; Other in her father.    Allergies Allergies  Allergen Reactions   Codeine Palpitations   Ace Inhibitors Cough   Latex Rash     Home Medications  Prior to Admission medications   Medication Sig Start Date End Date Taking? Authorizing Provider  acetaminophen (TYLENOL) 325 MG tablet Take 650 mg by mouth 3 (three) times daily.    [provider]  allopurinol (ZYLOPRIM) 300 MG tablet Take 300 mg daily by mouth. 09/16/17   [provider]  apixaban (ELIQUIS) 5 MG TABS tablet Take 1 tablet (5 mg total) by mouth 2 (two) times daily. 01/08/16   Herminio Commons, MD  atorvastatin (LIPITOR) 20 MG tablet Take 1 tablet (20 mg total) by mouth daily at 6 PM. 01/20/19   Dessa Phi, DO  carbidopa-levodopa (SINEMET IR) 10-100 MG tablet Take 1 tablet by mouth 2 (two) times daily.    [provider]    Critical care time:    Performed by: Johnsie Cancel  Total critical care time: 39 minutes  Critical care time was exclusive of separately billable procedures and treating other patients.  Critical care was necessary to treat or prevent imminent or life-threatening deterioration.  Critical care was time spent personally by me on the following activities: development of treatment plan with patient and/or surrogate as well as nursing, discussions with consultants, evaluation of patient's response to treatment, examination of patient, obtaining history from patient or surrogate, ordering and performing treatments and interventions, ordering and review of laboratory studies, ordering and review of radiographic studies, pulse oximetry and re-evaluation of patient's condition.  Johnsie Cancel, NP-C [facility] Contact / Pager information can be found on Amion  02/13/2020, 7:31 AM

## 2020-02-14 ENCOUNTER — Inpatient Hospital Stay (HOSPITAL_COMMUNITY): Payer: Medicare PPO

## 2020-02-14 ENCOUNTER — Inpatient Hospital Stay: Payer: Self-pay

## 2020-02-14 DIAGNOSIS — I5043 Acute on chronic combined systolic (congestive) and diastolic (congestive) heart failure: Secondary | ICD-10-CM | POA: Diagnosis not present

## 2020-02-14 DIAGNOSIS — J9601 Acute respiratory failure with hypoxia: Secondary | ICD-10-CM | POA: Diagnosis not present

## 2020-02-14 DIAGNOSIS — N179 Acute kidney failure, unspecified: Secondary | ICD-10-CM | POA: Diagnosis not present

## 2020-02-14 LAB — GLUCOSE, CAPILLARY
Glucose-Capillary: 162 mg/dL — ABNORMAL HIGH (ref 70–99)
Glucose-Capillary: 161 mg/dL — ABNORMAL HIGH (ref 70–99)
Glucose-Capillary: 145 mg/dL — ABNORMAL HIGH (ref 70–99)

## 2020-02-14 LAB — COMPREHENSIVE METABOLIC PANEL
ALT: 101 U/L — ABNORMAL HIGH (ref 0–44)
AST: 190 U/L — ABNORMAL HIGH (ref 15–41)
Albumin: 2.5 g/dL — ABNORMAL LOW (ref 3.5–5.0)
Alkaline Phosphatase: 496 U/L — ABNORMAL HIGH (ref 38–126)
Anion gap: 11 (ref 5–15)
BUN: 54 mg/dL — ABNORMAL HIGH (ref 8–23)
CO2: 23 mmol/L (ref 22–32)
Calcium: 8.3 mg/dL — ABNORMAL LOW (ref 8.9–10.3)
Chloride: 104 mmol/L (ref 98–111)
Creatinine, Ser: 1.43 mg/dL — ABNORMAL HIGH (ref 0.44–1.00)
GFR calc Af Amer: 41 mL/min — ABNORMAL LOW (ref >60)
GFR calc non Af Amer: 35 mL/min — ABNORMAL LOW (ref >60)
Glucose, Bld: 160 mg/dL — ABNORMAL HIGH (ref 70–99)
Potassium: 3.6 mmol/L (ref 3.5–5.1)
Sodium: 138 mmol/L (ref 135–145)
Total Bilirubin: 1.9 mg/dL — ABNORMAL HIGH (ref 0.3–1.2)
Total Protein: 6.4 g/dL — ABNORMAL LOW (ref 6.5–8.1)

## 2020-02-14 LAB — CBC
HCT: 37.8 % (ref 36.0–46.0)
Hemoglobin: 12.3 g/dL (ref 12.0–15.0)
MCH: 30.2 pg (ref 26.0–34.0)
MCHC: 32.5 g/dL (ref 30.0–36.0)
MCV: 92.9 fL (ref 80.0–100.0)
Platelets: 160 10*3/uL (ref 150–400)
RBC: 4.07 MIL/uL (ref 3.87–5.11)
RDW: 17.6 % — ABNORMAL HIGH (ref 11.5–15.5)
WBC: 16.7 10*3/uL — ABNORMAL HIGH (ref 4.0–10.5)
nRBC: 0.1 % (ref 0.0–0.2)

## 2020-02-14 LAB — COOXEMETRY PANEL
Carboxyhemoglobin: 1.4 % (ref 0.5–1.5)
Methemoglobin: 1.4 % (ref 0.0–1.5)
O2 Saturation: 56.3 %
Total hemoglobin: 12.3 g/dL (ref 12.0–16.0)

## 2020-02-14 LAB — APTT
aPTT: 34 seconds (ref 24–36)
aPTT: 52 seconds — ABNORMAL HIGH (ref 24–36)

## 2020-02-14 LAB — BRAIN NATRIURETIC PEPTIDE: B Natriuretic Peptide: 473.5 pg/mL — ABNORMAL HIGH (ref 0.0–100.0)

## 2020-02-14 LAB — MAGNESIUM: Magnesium: 1.5 mg/dL — ABNORMAL LOW (ref 1.7–2.4)

## 2020-02-14 LAB — PHOSPHORUS: Phosphorus: 3 mg/dL (ref 2.5–4.6)

## 2020-02-14 LAB — PROTIME-INR
INR: 1.8 — ABNORMAL HIGH (ref 0.8–1.2)
Prothrombin Time: 21 seconds — ABNORMAL HIGH (ref 11.4–15.2)

## 2020-02-14 LAB — HEPARIN LEVEL (UNFRACTIONATED): Heparin Unfractionated: 1.58 IU/mL — ABNORMAL HIGH (ref 0.30–0.70)

## 2020-02-14 MED ORDER — AMIODARONE HCL IN DEXTROSE 360-4.14 MG/200ML-% IV SOLN
60.0000 mg/h | INTRAVENOUS | Status: AC
  Administered 2020-02-14: 60 mg/h via INTRAVENOUS

## 2020-02-14 MED ORDER — SODIUM CHLORIDE 0.9% FLUSH
3.0000 mL | Freq: Two times a day (BID) | INTRAVENOUS | Status: DC
  Administered 2020-02-14 – 2020-02-15 (×2): 3 mL via INTRAVENOUS

## 2020-02-14 MED ORDER — ACETAMINOPHEN 325 MG PO TABS
650.0000 mg | ORAL_TABLET | Freq: Four times a day (QID) | ORAL | Status: DC | PRN
  Administered 2020-02-14 – 2020-02-15 (×2): 650 mg via ORAL
  Filled 2020-02-14 (×2): qty 2

## 2020-02-14 MED ORDER — AMIODARONE HCL IN DEXTROSE 360-4.14 MG/200ML-% IV SOLN
60.0000 mg/h | INTRAVENOUS | Status: AC
  Administered 2020-02-14: 60 mg/h via INTRAVENOUS
  Filled 2020-02-14: qty 200

## 2020-02-14 MED ORDER — MAGNESIUM SULFATE 2 GM/50ML IV SOLN
2.0000 g | Freq: Once | INTRAVENOUS | Status: AC
  Administered 2020-02-14: 2 g via INTRAVENOUS
  Filled 2020-02-14: qty 50

## 2020-02-14 MED ORDER — FUROSEMIDE 10 MG/ML IJ SOLN
80.0000 mg | Freq: Three times a day (TID) | INTRAMUSCULAR | Status: DC
  Administered 2020-02-14: 80 mg via INTRAVENOUS
  Filled 2020-02-14: qty 8

## 2020-02-14 MED ORDER — HEPARIN (PORCINE) 25000 UT/250ML-% IV SOLN
1300.0000 [IU]/h | INTRAVENOUS | Status: DC
  Administered 2020-02-14: 1200 [IU]/h via INTRAVENOUS
  Filled 2020-02-14 (×2): qty 250

## 2020-02-14 MED ORDER — AMIODARONE HCL IN DEXTROSE 360-4.14 MG/200ML-% IV SOLN
30.0000 mg/h | INTRAVENOUS | Status: DC
  Filled 2020-02-14: qty 200

## 2020-02-14 MED ORDER — FUROSEMIDE 10 MG/ML IJ SOLN
15.0000 mg/h | INTRAVENOUS | Status: DC
  Administered 2020-02-14 – 2020-02-15 (×3): 12 mg/h via INTRAVENOUS
  Filled 2020-02-14: qty 25
  Filled 2020-02-14: qty 21
  Filled 2020-02-14 (×2): qty 25

## 2020-02-14 MED ORDER — HYDROMORPHONE HCL 1 MG/ML PO LIQD
0.5000 mg | Freq: Once | ORAL | Status: AC
  Administered 2020-02-14: 0.5 mg via ORAL
  Filled 2020-02-14: qty 0.5

## 2020-02-14 MED ORDER — AMIODARONE HCL IN DEXTROSE 360-4.14 MG/200ML-% IV SOLN
60.0000 mg/h | INTRAVENOUS | Status: DC
  Administered 2020-02-15: 60 mg/h via INTRAVENOUS
  Administered 2020-02-15: 30 mg/h via INTRAVENOUS
  Administered 2020-02-16: 60 mg/h via INTRAVENOUS
  Filled 2020-02-14 (×2): qty 200

## 2020-02-14 MED ORDER — POTASSIUM CHLORIDE CRYS ER 20 MEQ PO TBCR
40.0000 meq | EXTENDED_RELEASE_TABLET | Freq: Two times a day (BID) | ORAL | Status: AC
  Administered 2020-02-14 (×2): 40 meq via ORAL
  Filled 2020-02-14 (×2): qty 2

## 2020-02-14 NOTE — Progress Notes (Signed)
eLink Physician-Brief Progress Note Patient Name: Leslie Alexander DOB: 1941-09-08 MRN: [id number]   Date of Service  02/14/2020  HPI/Events of Note  5 beat run of wide complex tachycardia with spontaneous termination, Pt is hemodynamically stable.  eICU Interventions  BMET, Mg ordered.        Thomasene Lot Julis Haubner 02/14/2020, 5:19 AM

## 2020-02-14 NOTE — Progress Notes (Signed)
Progress Note  Patient Name: Leslie Alexander Date of Encounter: 02/14/2020  Primary Cardiologist: Prentice Docker, MD   Subjective   Feels better, required bipap this morning but now off again.  Inpatient Medications    Scheduled Meds: . allopurinol  100 mg Oral Daily  . carbidopa-levodopa  1 tablet Oral BID  . chlorhexidine  15 mL Mouth Rinse BID  . Chlorhexidine Gluconate Cloth  6 each Topical Q0600  . DULoxetine  60 mg Oral Daily  . furosemide  80 mg Intravenous TID  . levothyroxine  100 mcg Oral Q0600  . mouth rinse  15 mL Mouth Rinse q12n4p   Continuous Infusions: . DOBUTamine 2.5 mcg/kg/min (02/14/20 0900)   PRN Meds: traZODone   Vital Signs    Vitals:   02/14/20 0723 02/14/20 0800 02/14/20 0842 02/14/20 0900  BP:  125/67  115/65  Pulse:  99 (!) 111 100  Resp:  13 (!) 23 (!) 24  Temp: (!) 97.4 F (36.3 C)     TempSrc: Axillary     SpO2:  (!) 85% 91% 92%  Weight:      Height:        Intake/Output Summary (Last 24 hours) at 02/14/2020 1032 Last data filed at 02/14/2020 0900 Gross per 24 hour  Intake 1114.88 ml  Output 3500 ml  Net -2385.12 ml   Last 3 Weights 02/14/2020 02/13/2020 02/12/2020  Weight (lbs) 246 lb 14.6 oz 255 lb 11.7 oz 257 lb 11.5 oz  Weight (kg) 112 kg 116 kg 116.9 kg      Telemetry    Afib, rates faster attributable to activity getting off bedpan. - Personally Reviewed  ECG    No new - Personally Reviewed  Physical Exam   GEN: NAD, speaking comfortably   Neck: JVP challenging to assess Cardiac: irregular rhythm, tachycardia, Respiratory: clear laterally and anterior. GI: soft, nontender  MS: diffuse edema in LE Neuro:  nonfocal Psych: a+o x 3, normal affect  Labs    High Sensitivity Troponin:   Recent Labs  Lab 2020/02/19 2335 02/12/20 0252 02/12/20 0931 02/13/20 1625 02/13/20 2020  TROPONINIHS 15 15 15 10 10       Chemistry Recent Labs  Lab 02/13/20 0846 02/13/20 1625 02/14/20 0600  NA 137 136 138  K  3.8 3.6 3.6  CL 102 100 104  CO2 20* 20* 23  GLUCOSE 133* 187* 160*  BUN 66* 63* 54*  CREATININE 2.15* 1.92* 1.43*  CALCIUM 8.5* 8.3* 8.3*  PROT 6.3* 6.2* 6.4*  ALBUMIN 2.5* 2.5* 2.5*  AST 305* 291* 190*  ALT 261* 103* 101*  ALKPHOS 505* 484* 496*  BILITOT 1.9* 1.7* 1.9*  GFRNONAA 22* 25* 35*  GFRAA 25* 29* 41*  ANIONGAP 15 16* 11     Hematology Recent Labs  Lab 02/12/20 0558 02/12/20 0558 02/12/20 0819 02/13/20 0846 02/14/20 0600  WBC 17.6*  --   --  17.5* 16.7*  RBC 4.32  --   --  4.03 4.07  HGB 12.9   < > 12.6 12.1 12.3  HCT 42.4   < > 37.0 37.6 37.8  MCV 98.1  --   --  93.3 92.9  MCH 29.9  --   --  30.0 30.2  MCHC 30.4  --   --  32.2 32.5  RDW 17.8*  --   --  17.3* 17.6*  PLT 258  --   --  177 160   < > = values in this interval not displayed.  BNP Recent Labs  Lab 02/12/20 0531 02/13/20 0846 02/14/20 0600  BNP 1,448.7* 503.9* 473.5*     DDimer No results for input(s): DDIMER in the last 168 hours.   Radiology    US RENAL  Result Date: 02/12/2020 CLINICAL DATA:  79 year old female with acute renal insufficiency. EXAM: RENAL / URINARY TRACT ULTRASOUND COMPLETE COMPARISON:  Abdominal ultrasound dated 02/05/2020. FINDINGS: Evaluation is limited due to patient's body habitus. Right Kidney: Renal measurements: 9.3 x 5.0 x 4.4 cm = volume: 107 mL. Normal echogenicity. No hydronephrosis or shadowing stone. Left Kidney: Renal measurements: 8.7 x 5.6 x 4.6 cm = volume: 118 mL. Normal echogenicity. No hydronephrosis or shadowing stone. The stone seen in the inferior pole of the left kidney on the prior ultrasound is not well visualized on today's exam. Bladder: The urinary bladder is predominantly decompressed around a Foley catheter. Other: None. IMPRESSION: No hydronephrosis or shadowing stone. Electronically Signed   By: Anner Crete M.D.   On: 02/12/2020 19:24   ECHOCARDIOGRAM COMPLETE BUBBLE STUDY  Result Date: 02/12/2020    ECHOCARDIOGRAM REPORT    Patient Name:   Leslie Alexander Date of Exam: 02/12/2020 Medical Rec #:  [id number]         Height:       65.0 in Accession #:    [id number]        Weight:       257.7 lb Date of Birth:  04-18-1941         BSA:          2.203 m Patient Age:    79 years          BP:           133/64 mmHg Patient Gender: F                 HR:           88 bpm. Exam Location:  Inpatient Procedure: 2D Echo Indications:    cardiogenic shock  History:        Patient has prior history of Echocardiogram examinations, most                 recent 01/12/2019. CHF, CAD, Arrythmias:Atrial Fibrillation; Risk                 Factors:Dyslipidemia and Former Smoker. Pulmonary hypertension.  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 restricted mobility IMPRESSIONS  1. Left ventricular ejection fraction, by estimation, is 35 to 40%. The left ventricle has severely decreased function. The left ventricle demonstrates global hypokinesis. There is moderate left ventricular hypertrophy. Left ventricular diastolic function could not be evaluated. Left ventricular diastolic parameters are indeterminate. There is the interventricular septum is flattened in systole and diastole, consistent with right ventricular pressure and volume overload. Septal dyssynchrony is significant.  2. Right ventricular systolic function is moderately reduced. The right ventricular size is moderately enlarged. There is moderately elevated pulmonary artery systolic pressure. The estimated right ventricular systolic pressure is 02.5 mmHg.  3. Left atrial size was moderately dilated.  4. Right atrial size was severely dilated.  5. The mitral valve is abnormal. Mild mitral valve regurgitation.  6. The aortic valve is abnormal. Aortic valve regurgitation is trivial. Mild aortic valve sclerosis is present, with no  evidence of aortic valve stenosis.  7. The inferior vena  cava is dilated in size with <50% respiratory variability, suggesting right atrial pressure of 15 mmHg. Comparison(s): 01/12/19: LVEF 40-45%. Conclusion(s)/Recommendation(s): Study reviewed with Dr. Jacques Navy, she is in agreement. FINDINGS  Left Ventricle: Left ventricular ejection fraction, by estimation, is 35 to 40%. The left ventricle has moderately decreased function. The left ventricle demonstrates global hypokinesis. The left ventricular internal cavity size was normal in size. There is moderate left ventricular hypertrophy. The interventricular septum is flattened in systole and diastole, consistent with right ventricular pressure and volume overload. Left ventricular diastolic function could not be evaluated due to atrial fibrillation. Left ventricular diastolic parameters are indeterminate. Right Ventricle: The right ventricular size is moderately enlarged. No increase in right ventricular wall thickness. Right ventricular systolic function is moderately reduced. There is moderately elevated pulmonary artery systolic pressure. The tricuspid  regurgitant velocity is 2.92 m/s, and with an assumed right atrial pressure of 15 mmHg, the estimated right ventricular systolic pressure is 49.1 mmHg. Left Atrium: Left atrial size was moderately dilated. Right Atrium: Right atrial size was severely dilated. Pericardium: Trivial pericardial effusion is present. The pericardial effusion is circumferential. Mitral Valve: The mitral valve is abnormal. Mild mitral valve regurgitation. Tricuspid Valve: The tricuspid valve is grossly normal. Tricuspid valve regurgitation is mild. Aortic Valve: The aortic valve is abnormal. Aortic valve regurgitation is trivial. Mild aortic valve sclerosis is present, with no evidence of aortic valve stenosis. Pulmonic Valve: The pulmonic valve was grossly normal. Pulmonic valve regurgitation is trivial. Aorta: The aortic root was not  well visualized. Venous: The inferior vena cava is dilated in size with less than 50% respiratory variability, suggesting right atrial pressure of 15 mmHg. IAS/Shunts: The interatrial septum was not well visualized.  LEFT VENTRICLE PLAX 2D LVIDd:         4.80 cm LVIDs:         3.15 cm LV PW:         1.40 cm LV IVS:        0.90 cm LVOT diam:     1.90 cm LV SV:         36 LV SV Index:   16 LVOT Area:     2.84 cm  LEFT ATRIUM           Index       RIGHT ATRIUM           Index LA diam:      4.90 cm 2.22 cm/m  RA Area:     22.30 cm LA Vol (A4C): 32.6 ml 14.80 ml/m RA Volume:   57.20 ml  25.96 ml/m  AORTIC VALVE LVOT Vmax:   67.20 cm/s LVOT Vmean:  53.700 cm/s LVOT VTI:    0.128 m  AORTA Ao Root diam: 3.10 cm MITRAL VALVE               TRICUSPID VALVE MV Area (PHT): 1.89 cm    TR Peak grad:   34.1 mmHg MV Decel Time: 401 msec    TR Vmax:        292.00 cm/s MV E velocity: 81.80 cm/s                            SHUNTS                            Systemic VTI:  0.13 m  Systemic Diam: 1.90 cm Zoila Shutter MD Electronically signed by Zoila Shutter MD Signature Date/Time: 02/12/2020/3:03:26 PM    Final    VAS [place] UPPER EXTREMITY VENOUS DUPLEX  Result Date: 02/13/2020 UPPER VENOUS STUDY  Indications: Swelling, and bruising Limitations: Body habitus, bandages, line and poor ultrasound/tissue interface. Comparison Study: No prior exam. Performing Technologist: Kennedy Bucker ARDMS, RVT  Examination Guidelines: A complete evaluation includes B-mode imaging, spectral Doppler, color Doppler, and power Doppler as needed of all accessible portions of each vessel. Bilateral testing is considered an integral part of a complete examination. Limited examinations for reoccurring indications may be performed as noted.  Right Findings: +----------+------------+---------+-----------+----------+---------------------+ RIGHT     CompressiblePhasicitySpontaneousProperties       Summary         +----------+------------+---------+-----------+----------+---------------------+ IJV           Full       Yes       Yes                                    +----------+------------+---------+-----------+----------+---------------------+ Subclavian    Full       Yes       Yes               IV line visualized   +----------+------------+---------+-----------+----------+---------------------+ Axillary      Full       Yes       Yes               IV line visualized   +----------+------------+---------+-----------+----------+---------------------+ Brachial      Full       Yes       Yes               IV line visualized   +----------+------------+---------+-----------+----------+---------------------+ Radial        Full                                  visualized with color +----------+------------+---------+-----------+----------+---------------------+ Ulnar         Full                                  visualized with color +----------+------------+---------+-----------+----------+---------------------+ Cephalic      Full                                                        +----------+------------+---------+-----------+----------+---------------------+ Basilic                                                Not visualized     +----------+------------+---------+-----------+----------+---------------------+  Summary:  Right: No evidence of deep vein thrombosis in the upper extremity. No evidence of superficial vein thrombosis in the upper extremity. Basilic vein not visualized. Limited visualization of radial and ulnar veins due to small caliber.  *See table(s) above for measurements and observations.  Diagnosing physician: Waverly Ferrari MD Electronically signed by Waverly Ferrari MD on 02/13/2020 at 7:19:52 PM.    Final  Cardiac Studies   Patient Profile     79 y.o. female with h/o moderate pulm HTN and associated R heart dysfunction (mildly reduced  RV systolic function on TTE done 2-95-62), mild non-obst CAD, normal LV systolic function on TTE 02-07-20, OSA, HTN, chronic/persistent afib, morbid obesity, stage 3 CKD transferred from [facility] due to SOB and edema.  Assessment & Plan    1. Pulmonary HTN/RV dysfunction with HFrEF: Improving on dobutamine infusion and aggressive diuresis. Continue. Discussed with Dr. Delton Coombes in ICU. I/O -3.75 L UOP yesterday, approximately 3.75 L negative for admission. - respiratory status improving, on and off of bipap -Cr improving. - will decrease lasix dosing to 80 mg IV TID, responding well.   - Will plan for RHC with advanced heart failure team tomorrow or Friday to assess prior diagnosis of primary pulmonary HTN and left heart failure.   2. Acute hypoxic respiratory failure: -Continued management per PCCM, improving.  3. Persistent atrial fibrillation: -PTA on Toprol XL 50mg  >>>discharge summary reported Eliquis however not on PTA -No AC in the setting of elevated INR, improving, will discuss as clinical picture improves.  4. CAD: -Nonobstructive CAD per [facility] in 12/2018 -No anginal symptoms -Continue ASA, BB  -No statin in the setting of elevated LFTs  5. HTN: - on dobutamine  6. Acute on chronic kidney disease/acute liver injury: -Creatinine, 1.43 improved  -LFTs improving, INR now 1.8.     For questions or updates, please contact [facility] Please consult [email or web address] for contact info under        Signed, 01/2019, MD  02/14/2020, 10:32 AM    CRITICAL CARE TIME: I have spent a total of 35 minutes with patient reviewing hospital notes, telemetry, EKGs, labs and examining the patient as well as establishing an assessment and plan that was discussed with the patient. > 50% of time was spent in direct patient care. The patient is critically ill with multi-organ system failure on inotropic support and requires high complexity decision making for assessment and  support, frequent evaluation and titration of therapies, application of advanced monitoring technologies and extensive interpretation of multiple databases.

## 2020-02-14 NOTE — Progress Notes (Signed)
[facility] Progress Note   Subjective:   Continued improvement but on bipap this AM.  I/Os yest 1.4 / 3.75.  Cr improved further to 1.4.   Objective Vitals:   02/14/20 0428 02/14/20 0500 02/14/20 0600 02/14/20 0723  BP:  128/60 110/72   Pulse:  (!) 101 (!) 107   Resp:  13 15   Temp:    (!) 97.4 F (36.3 C)  TempSrc:    Axillary  SpO2:  92% 91%   Weight: 112 kg     Height:       Physical Exam General: elderly, obese, comfortable on bipap Heart: RRR, no rub Lungs: dec BS bases, normal WOB at rest Abdomen: soft, mod distended, nontender Extremities: 1+ pitting diffuse edema - improving  Additional Objective Labs: Basic Metabolic Panel: Recent Labs  Lab 02/12/20 0558 02/12/20 0819 02/13/20 0846 02/13/20 1625 02/14/20 0600  NA 132*   < > 137 136 138  K 6.1*   < > 3.8 3.6 3.6  CL 98   < > 102 100 104  CO2 13*   < > 20* 20* 23  GLUCOSE 111*   < > 133* 187* 160*  BUN 58*   < > 66* 63* 54*  CREATININE 2.90*   < > 2.15* 1.92* 1.43*  CALCIUM 9.2   < > 8.5* 8.3* 8.3*  PHOS 6.9*  --  4.5  --  3.0   < > = values in this interval not displayed.   Liver Function Tests: Recent Labs  Lab 02/13/20 0846 02/13/20 1625 02/14/20 0600  AST 305* 291* 190*  ALT 261* 103* 101*  ALKPHOS 505* 484* 496*  BILITOT 1.9* 1.7* 1.9*  PROT 6.3* 6.2* 6.4*  ALBUMIN 2.5* 2.5* 2.5*   Recent Labs  Lab 02/12/20 0531  LIPASE 35   CBC: Recent Labs  Lab 03/04/2020 2335 March 04, 2020 2335 02/12/20 0558 02/12/20 0558 02/12/20 0819 02/13/20 0846 02/14/20 0600  WBC 18.9*   < > 17.6*  --   --  17.5* 16.7*  NEUTROABS 16.1*  --   --   --   --   --   --   HGB 14.1   < > 12.9   < > 12.6 12.1 12.3  HCT 45.3   < > 42.4   < > 37.0 37.6 37.8  MCV 97.4  --  98.1  --   --  93.3 92.9  PLT 254   < > 258  --   --  177 160   < > = values in this interval not displayed.   Blood Culture    Component Value Date/Time   SDES BLOOD RIGHT HAND 02/12/2020 0457   SPECREQUEST  02/12/2020 0457     BOTTLES DRAWN AEROBIC ONLY Blood Culture adequate volume   CULT  02/12/2020 0457    NO GROWTH 2 DAYS Performed at [facility], [address], [place], [place] [address]    REPTSTATUS PENDING 02/12/2020 5170    Cardiac Enzymes: No results for input(s): CKTOTAL, CKMB, CKMBINDEX, TROPONINI in the last 168 hours. CBG: Recent Labs  Lab 02/13/20 1529 02/13/20 1923 02/13/20 2333 02/14/20 0317 02/14/20 0721  GLUCAP 162* 153* 156* 161* 145*   Iron Studies: No results for input(s): IRON, TIBC, TRANSFERRIN, FERRITIN in the last 72 hours. @lablastinr3 @ Studies/Results: RENAL  Result Date: 02/12/2020 CLINICAL DATA:  79 year old female with acute renal insufficiency. EXAM: RENAL / URINARY TRACT ULTRASOUND COMPLETE COMPARISON:  Abdominal ultrasound dated 02/05/2020. FINDINGS: Evaluation is  limited due to patient's body habitus. Right Kidney: Renal measurements: 9.3 x 5.0 x 4.4 cm = volume: 107 mL. Normal echogenicity. No hydronephrosis or shadowing stone. Left Kidney: Renal measurements: 8.7 x 5.6 x 4.6 cm = volume: 118 mL. Normal echogenicity. No hydronephrosis or shadowing stone. The stone seen in the inferior pole of the left kidney on the prior ultrasound is not well visualized on today's exam. Bladder: The urinary bladder is predominantly decompressed around a Foley catheter. Other: None. IMPRESSION: No hydronephrosis or shadowing stone. Electronically Signed   By: Anner Crete M.D.   On: 02/12/2020 19:24   ECHOCARDIOGRAM COMPLETE BUBBLE STUDY  Result Date: 02/12/2020    ECHOCARDIOGRAM REPORT   Patient Name:   Leslie Alexander Date of Exam: 02/12/2020 Medical Rec #:  [id number]         Height:       65.0 in Accession #:    [id number]        Weight:       257.7 lb Date of Birth:  1941/08/24         BSA:          2.203 m Patient Age:    37 years          BP:           133/64 mmHg Patient Gender: F                 HR:           88 bpm. Exam Location:  Inpatient Procedure: 2D Echo  Indications:    cardiogenic shock  History:        Patient has prior history of Echocardiogram examinations, most                 recent 01/12/2019. CHF, CAD, Arrythmias:Atrial Fibrillation; Risk                 Factors:Dyslipidemia and Former Smoker. Pulmonary hypertension.  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 restricted mobility IMPRESSIONS  1. Left ventricular ejection fraction, by estimation, is 35 to 40%. The left ventricle has severely decreased function. The left ventricle demonstrates global hypokinesis. There is moderate left ventricular hypertrophy. Left ventricular diastolic function could not be evaluated. Left ventricular diastolic parameters are indeterminate. There is the interventricular septum is flattened in systole and diastole, consistent with right ventricular pressure and volume overload. Septal dyssynchrony is significant.  2. Right ventricular systolic function is moderately reduced. The right ventricular size is moderately enlarged. There is moderately elevated pulmonary artery systolic pressure. The estimated right ventricular systolic pressure is 43.3 mmHg.  3. Left atrial size was moderately dilated.  4. Right atrial size was severely dilated.  5. The mitral valve is abnormal. Mild mitral valve regurgitation.  6. The aortic valve is abnormal. Aortic valve regurgitation is trivial. Mild aortic valve sclerosis is present, with no evidence of aortic valve stenosis.  7. The inferior vena cava is dilated in size with <50% respiratory variability, suggesting right atrial pressure of 15 mmHg. Comparison(s): 01/12/19: LVEF 40-45%. Conclusion(s)/Recommendation(s): Study reviewed with Dr. Margaretann Loveless, she is in agreement. FINDINGS  Left Ventricle: Left ventricular ejection fraction, by estimation, is 35 to 40%. The left ventricle  has moderately decreased function. The left ventricle demonstrates global hypokinesis. The left ventricular internal cavity size was normal in size. There  is moderate left ventricular hypertrophy. The interventricular septum is flattened in systole and diastole, consistent with right ventricular pressure and volume overload. Left ventricular diastolic function could not be evaluated due to atrial fibrillation. Left ventricular diastolic parameters are indeterminate. Right Ventricle: The right ventricular size is moderately enlarged. No increase in right ventricular wall thickness. Right ventricular systolic function is moderately reduced. There is moderately elevated pulmonary artery systolic pressure. The tricuspid  regurgitant velocity is 2.92 m/s, and with an assumed right atrial pressure of 15 mmHg, the estimated right ventricular systolic pressure is 49.1 mmHg. Left Atrium: Left atrial size was moderately dilated. Right Atrium: Right atrial size was severely dilated. Pericardium: Trivial pericardial effusion is present. The pericardial effusion is circumferential. Mitral Valve: The mitral valve is abnormal. Mild mitral valve regurgitation. Tricuspid Valve: The tricuspid valve is grossly normal. Tricuspid valve regurgitation is mild. Aortic Valve: The aortic valve is abnormal. Aortic valve regurgitation is trivial. Mild aortic valve sclerosis is present, with no evidence of aortic valve stenosis. Pulmonic Valve: The pulmonic valve was grossly normal. Pulmonic valve regurgitation is trivial. Aorta: The aortic root was not well visualized. Venous: The inferior vena cava is dilated in size with less than 50% respiratory variability, suggesting right atrial pressure of 15 mmHg. IAS/Shunts: The interatrial septum was not well visualized.  LEFT VENTRICLE PLAX 2D LVIDd:         4.80 cm LVIDs:         3.15 cm LV PW:         1.40 cm LV IVS:        0.90 cm LVOT diam:     1.90 cm LV SV:         36 LV SV Index:   16 LVOT  Area:     2.84 cm  LEFT ATRIUM           Index       RIGHT ATRIUM           Index LA diam:      4.90 cm 2.22 cm/m  RA Area:     22.30 cm LA Vol (A4C): 32.6 ml 14.80 ml/m RA Volume:   57.20 ml  25.96 ml/m  AORTIC VALVE LVOT Vmax:   67.20 cm/s LVOT Vmean:  53.700 cm/s LVOT VTI:    0.128 m  AORTA Ao Root diam: 3.10 cm MITRAL VALVE               TRICUSPID VALVE MV Area (PHT): 1.89 cm    TR Peak grad:   34.1 mmHg MV Decel Time: 401 msec    TR Vmax:        292.00 cm/s MV E velocity: 81.80 cm/s                            SHUNTS                            Systemic VTI:  0.13 m                            Systemic Diam: 1.90 cm Zoila Shutter MD Electronically signed by Zoila Shutter MD Signature Date/Time: 02/12/2020/3:03:26 PM    Final    VAS [place] UPPER EXTREMITY VENOUS DUPLEX  Result Date: 02/13/2020 UPPER VENOUS STUDY  Indications: Swelling, and bruising Limitations: Body habitus, bandages, line and poor ultrasound/tissue  interface. Comparison Study: No prior exam. Performing Technologist: Kennedy Bucker ARDMS, RVT  Examination Guidelines: A complete evaluation includes B-mode imaging, spectral Doppler, color Doppler, and power Doppler as needed of all accessible portions of each vessel. Bilateral testing is considered an integral part of a complete examination. Limited examinations for reoccurring indications may be performed as noted.  Right Findings: +----------+------------+---------+-----------+----------+---------------------+ RIGHT     CompressiblePhasicitySpontaneousProperties       Summary        +----------+------------+---------+-----------+----------+---------------------+ IJV           Full       Yes       Yes                                    +----------+------------+---------+-----------+----------+---------------------+ Subclavian    Full       Yes       Yes               IV line visualized   +----------+------------+---------+-----------+----------+---------------------+  Axillary      Full       Yes       Yes               IV line visualized   +----------+------------+---------+-----------+----------+---------------------+ Brachial      Full       Yes       Yes               IV line visualized   +----------+------------+---------+-----------+----------+---------------------+ Radial        Full                                  visualized with color +----------+------------+---------+-----------+----------+---------------------+ Ulnar         Full                                  visualized with color +----------+------------+---------+-----------+----------+---------------------+ Cephalic      Full                                                        +----------+------------+---------+-----------+----------+---------------------+ Basilic                                                Not visualized     +----------+------------+---------+-----------+----------+---------------------+  Summary:  Right: No evidence of deep vein thrombosis in the upper extremity. No evidence of superficial vein thrombosis in the upper extremity. Basilic vein not visualized. Limited visualization of radial and ulnar veins due to small caliber.  *See table(s) above for measurements and observations.  Diagnosing physician: Waverly Ferrari MD Electronically signed by Waverly Ferrari MD on 02/13/2020 at 7:19:52 PM.    Final    Medications: . ceFEPime (MAXIPIME) IV Stopped (02/14/20 0456)  . DOBUTamine 2.5 mcg/kg/min (02/14/20 0600)   . allopurinol  100 mg Oral Daily  . carbidopa-levodopa  1 tablet Oral BID  . chlorhexidine  15 mL Mouth Rinse BID  . Chlorhexidine Gluconate Cloth  6 each Topical Q0600  .  DULoxetine  60 mg Oral Daily  . levothyroxine  100 mcg Oral Q0600  . mouth rinse  15 mL Mouth Rinse q12n4p   Leslie Alexander is a 79 y.o. female with history of hypertension, obesity, OSA, CKD stage III, A. fib, pulmonary hypertension associated  with right heart dysfunction, nonobstructive CAD, transferred from [facility], seen as a consultation at the request of Dr. Everardo All for acute kidney injury.  Assessment/Plan: #Acute kidney injury on CKD III due to poor renal perfusion related with cardiogenic shock.  UA with possible UTI and has protein.  UP/C 0.30.  Renal ultrasound 3/22 normal.  Has responded great to diuretics and renal function consistently improving - appears she's back to near baseline today with Cr 1.4.  I will sign off for now.  I think it's fine for her PCP to monitor renal function and if needed/worsening can refer to nephrology outpt.    #Hyperkalemia: resolved  #Cardiogenic shock/biventricular heart failure: With moderate pulmonary hypertension.  Cardiology following.  Diuretics and dobutamine as above.  Plan for echo and cardiac cath when stable > TTE done but read pending.  #Acute respiratory failure with hypoxia: Chest x-ray with pulmonary edema.  On BiPAP.  Volume management with diuretics -- making progress.  #Hypervolemic hyponatremia: Mild, resolved with diuresis.   #Coagulopathy and acute liver failure: Per primary team.  Received FFP initially.  Thought to be congestive physiology.   **Hematuria:  Will need to be rechecked when foley out. If persists will need urology eval.  Will sign off, page with questions.   Estill Bakes MD 02/14/2020, 8:37 AM  [facility] Pager: [phone]

## 2020-02-14 NOTE — Progress Notes (Signed)
Assisted tele visit to patient with daughter.  Louise Victory P, RN  

## 2020-02-14 NOTE — Progress Notes (Signed)
ANTICOAGULATION CONSULT NOTE - Initial Consult  Pharmacy Consult:  Heparin Indication: atrial fibrillation  Allergies  Allergen Reactions  . Codeine Palpitations  . Ace Inhibitors Cough  . Latex Rash    Patient Measurements: Height: 5\' 5"  (027.7 cm) Weight: 246 lb 14.6 oz (112 kg) IBW/kg (Calculated) : 57 Heparin Dosing Weight: 84 kg  Vital Signs: Temp: 97.4 F (36.3 C) (03/24 0723) Temp Source: Axillary (03/24 0723) BP: 121/69 (03/24 1000) Pulse Rate: 97 (03/24 1000)  Labs: Recent Labs    02/12/20 0558 02/12/20 0558 02/12/20 0819 02/12/20 0819 02/12/20 0931 02/12/20 1444 02/12/20 1715 02/13/20 0846 02/13/20 1625 02/13/20 2020 02/14/20 0600  HGB 12.9   < > 12.6   < >  --   --   --  12.1  --   --  12.3  HCT 42.4   < > 37.0  --   --   --   --  37.6  --   --  37.8  PLT 258  --   --   --   --   --   --  177  --   --  160  APTT  --   --   --   --   --   --   --   --   --   --  34  LABPROT 54.9*   < >  --   --   --   --  37.5* 27.7*  --   --  21.0*  INR 6.2*   < >  --   --   --   --  3.8* 2.6*  --   --  1.8*  HEPARINUNFRC  --   --   --   --   --   --   --   --   --   --  1.58*  CREATININE 2.90*   < >  --    < > 2.76*   < > 2.57* 2.15* 1.92*  --  1.43*  TROPONINIHS  --    < >  --   --  15  --   --   --  10 10  --    < > = values in this interval not displayed.    Estimated Creatinine Clearance: 41.7 mL/min (A) (by C-G formula based on SCr of 1.43 mg/dL (H)).   Medical History: Past Medical History:  Diagnosis Date  . Atrial fibrillation ([place])   . CAD (coronary artery disease)    a. mild nonobstructive CAD by cath in 11/2014. b. repeat cath in 12/2018 showing minor CAD; noted to have moderate pulmonary HTN  . Chronic combined systolic (congestive) and diastolic (congestive) heart failure (HCC)    a. EF reduced to 40-45% by echo in 12/2018  . Depression   . Essential hypertension, benign   . GERD (gastroesophageal reflux disease)   . History of breast cancer    . Hx of atrial fibrillation, no current medication    converted out of A. Fib with cardioversion  . Hypothyroidism   . Left bundle branch block    negative Lexiscan Myoview; EF 56%, 3/13   . Lumbar disc disease   . Meralgia paresthetica, right   . Mixed hyperlipidemia   . Morbid obesity ([place])   . Nonalcoholic fatty liver disease   . OSA (obstructive sleep apnea)    CPAP machine  . Pulmonary hypertension (HCC)    RVSP 55-60 mm mercury    Assessment: 78 YOF transferred  from [place] with SOB and swelling thought due to heart failure.  Patient was on Eliquis PTA for history of Afib, last dose on 02/10/2020 AM.  Patient has cardiogenic shock with hepatic congestion, so INR peaked at 6.2.  INR came down to 1.8 today.  Pharmacy consulted to start IV heparin.  Baseline aPTT low and heparin level is elevated at 1.58, indicating that Eliquis is not completely cleared from patient's system.  Will use aPTT to guide heparin dosing until aPTT and heparin level correlate.  No bleeding reported.  Goal of Therapy:  Heparin level 0.3-0.7 units/ml aPTT 66-102 seconds Monitor platelets by anticoagulation protocol: Yes   Plan:  Heparin 1200 units/hr, no bolus Check 6 hr aPTT Daily heparin level, aPTT and CBC Cardiac cath Thurs or Fri  Corran Lalone D. Laney Potash, PharmD, BCPS, BCCCP 02/14/2020, 11:11 AM

## 2020-02-14 NOTE — Progress Notes (Signed)
Patient taken off BiPap and placed on 12l HFNC.  Patient tolerating well, with no complications noted.

## 2020-02-14 NOTE — Progress Notes (Signed)
ANTICOAGULATION CONSULT NOTE - Initial Consult  Pharmacy Consult:  Heparin Indication: atrial fibrillation  Allergies  Allergen Reactions  . Codeine Palpitations  . Ace Inhibitors Cough  . Latex Rash    Patient Measurements: Height: 5\' 5"  (165.1 cm) Weight: 246 lb 14.6 oz (112 kg) IBW/kg (Calculated) : 57 Heparin Dosing Weight: 84 kg  Vital Signs: Temp: 98.4 F (36.9 C) (03/24 2003) Temp Source: Axillary (03/24 2003) BP: 129/77 (03/24 2000) Pulse Rate: 113 (03/24 2000)  Labs: Recent Labs    02/12/20 0558 02/12/20 0558 02/12/20 0819 02/12/20 0819 02/12/20 0931 02/12/20 1444 02/12/20 1715 02/13/20 0846 02/13/20 1625 02/13/20 2020 02/14/20 0600 02/14/20 1944  HGB 12.9   < > 12.6   < >  --   --   --  12.1  --   --  12.3  --   HCT 42.4   < > 37.0  --   --   --   --  37.6  --   --  37.8  --   PLT 258  --   --   --   --   --   --  177  --   --  160  --   APTT  --   --   --   --   --   --   --   --   --   --  34 52*  LABPROT 54.9*   < >  --   --   --   --  37.5* 27.7*  --   --  21.0*  --   INR 6.2*   < >  --   --   --   --  3.8* 2.6*  --   --  1.8*  --   HEPARINUNFRC  --   --   --   --   --   --   --   --   --   --  1.58*  --   CREATININE 2.90*   < >  --    < > 2.76*   < > 2.57* 2.15* 1.92*  --  1.43*  --   TROPONINIHS  --    < >  --   --  15  --   --   --  10 10  --   --    < > = values in this interval not displayed.    Estimated Creatinine Clearance: 41.7 mL/min (A) (by C-G formula based on SCr of 1.43 mg/dL (H)).   Medical History: Past Medical History:  Diagnosis Date  . Atrial fibrillation (HCC)   . CAD (coronary artery disease)    a. mild nonobstructive CAD by cath in 11/2014. b. repeat cath in 12/2018 showing minor CAD; noted to have moderate pulmonary HTN  . Chronic combined systolic (congestive) and diastolic (congestive) heart failure (HCC)    a. EF reduced to 40-45% by echo in 12/2018  . Depression   . Essential hypertension, benign   . GERD  (gastroesophageal reflux disease)   . History of breast cancer   . Hx of atrial fibrillation, no current medication    converted out of A. Fib with cardioversion  . Hypothyroidism   . Left bundle branch block    negative Lexiscan Myoview; EF 56%, 3/13   . Lumbar disc disease   . Meralgia paresthetica, right   . Mixed hyperlipidemia   . Morbid obesity (HCC)   . Nonalcoholic fatty liver disease   . OSA (  obstructive sleep apnea)    CPAP machine  . Pulmonary hypertension (HCC)    RVSP 55-60 mm mercury    Assessment: 78 YOF transferred from [facility] with SOB and swelling thought due to heart failure.  Patient was on Eliquis PTA for history of Afib, last dose on 02/13/2020 AM.  Patient has cardiogenic shock with hepatic congestion, so INR peaked at 6.2.  INR came down to 1.8 today.  Pharmacy consulted to start IV heparin.  Baseline aPTT low and heparin level is elevated at 1.58, indicating that Eliquis is not completely cleared from patient's system.  Will use aPTT to guide heparin dosing until aPTT and heparin level correlate.    Initial aptt just below goal at 52s on 1200 units/hr. No bleeding issues noted, will titrate rate.   Goal of Therapy:  Heparin level 0.3-0.7 units/ml aPTT 66-102 seconds Monitor platelets by anticoagulation protocol: Yes   Plan:  Increase heparin to 1350 units/hr, no bolus Daily heparin level, aPTT and CBC Cardiac cath Thurs or [place] PharmD., BCPS Clinical Pharmacist 02/14/2020 8:39 PM

## 2020-02-14 NOTE — Progress Notes (Signed)
Orthopedic Tech Progress Note Patient Details:  Leslie Alexander Jul 12, 1941 [id number]  Ortho Devices Type of Ortho Device: Roland Rack boot Ortho Device/Splint Location: BLE Ortho Device/Splint Interventions: Ordered, Application   Post Interventions Patient Tolerated: Well Instructions Provided: Care of device, Adjustment of device   Donald Pore 02/14/2020, 3:40 PM

## 2020-02-14 NOTE — Progress Notes (Signed)
NAME:  Leslie Alexander, MRN:  [id number], DOB:  08-09-1941, LOS: 3 ADMISSION DATE:  02/13/2020, CONSULTATION DATE:  02/12/2020 REFERRING MD:  Hospitalist, CHIEF COMPLAINT:  Lactic acidosis   Brief History   79 yo F with a history of pulmonary hypertension, diastolic heart failure, non-obstructive CAD, OSA, HTN, persistent Afib, obesity, and stage 3 CKD who presented to [facility] 02/01/20 for shortness of breath and swelling, thought to be due to heart failure.  She was also being treated for a klebsiella UTI and possible pneumonia with ceftriaxone and azithromycin.   Patient was initially diuresed aggressively with IV lasix, but then developed hyponatremia, and diuresis was stopped and she was given normal saline.  She was also having issues with hyperkalemia over there and was given kayexalate.   History of present illness   Family wanted her transferred to [facility].  Over at [facility], she was having elevated LFTs, particularly the alk phos.  This evening, patient had an episode of hypoglycemia, which improved with dextrose.  Her labs returned with significant abnormalities including a significant lactic acidosis with lactate of 9, bicarb of 10, hyperkalemia, and INR of 5.  When seen at the bedside, patient is lethargic but overall but alert and oriented, satting 97% on 6L.  She feels unwell, but not in any pain except for mild diffuse abdominal pain, which she attributes to not having a bowel movement. Denies any chest pain and thinks his breathing is "alright."  Past Medical History   Past Medical History:  Diagnosis Date  . Atrial fibrillation ([place])   . CAD (coronary artery disease)    a. mild nonobstructive CAD by cath in 11/2014. b. repeat cath in 12/2018 showing minor CAD; noted to have moderate pulmonary HTN  . Chronic combined systolic (congestive) and diastolic (congestive) heart failure (HCC)    a. EF reduced to 40-45% by echo in 12/2018  . Depression   . Essential hypertension,  benign   . GERD (gastroesophageal reflux disease)   . History of breast cancer   . Hx of atrial fibrillation, no current medication    converted out of A. Fib with cardioversion  . Hypothyroidism   . Left bundle branch block    negative Lexiscan Myoview; EF 56%, 3/13   . Lumbar disc disease   . Meralgia paresthetica, right   . Mixed hyperlipidemia   . Morbid obesity ([place])   . Nonalcoholic fatty liver disease   . OSA (obstructive sleep apnea)    CPAP machine  . Pulmonary hypertension (HCC)    RVSP 55-60 mm mercury     Significant Hospital Events   3/21: Transferred from [facility]  3/22: Transferred to ICU for lactic acidosis  Consults:  Cardiology  Procedures:    Significant Diagnostic Tests:  RUQ doppler 3/9: patent portal vein, liver within normal limits  CT A/P w/o contrast: Nonobstructive left nephrolithiasis, fat stranding around pancreas (per [facility], this is chronic)  BNP: 3838 3/15 -->5965 3/21 Na: 3/15 125-->127-->120>123>125 (3/17)-->130 (3/18) --> 128 (3/20)--> 130 (3/21) Cr: 3/15 1.3-->1.58-->1.77-->1.56--> 0.9 --> 1.37 (3/20) -->1.63 (3/21)  ECHO 3/22 > 1. Left ventricular ejection fraction, by estimation, is 35 to 40%. The  left ventricle has severely decreased function. The left ventricle  demonstrates global hypokinesis. There is moderate left ventricular  hypertrophy. Left ventricular diastolic  function could not be evaluated. Left ventricular diastolic parameters are  indeterminate. There is the interventricular septum is flattened in  systole and diastole, consistent with right ventricular pressure  and  volume overload. Septal dyssynchrony is  significant.  2. Right ventricular systolic function is moderately reduced. The right  ventricular size is moderately enlarged. There is moderately elevated  pulmonary artery systolic pressure. The estimated right ventricular  systolic pressure is 78.4 mmHg.  3. Left atrial size was  moderately dilated.  4. Right atrial size was severely dilated.  5. The mitral valve is abnormal. Mild mitral valve regurgitation.  6. The aortic valve is abnormal. Aortic valve regurgitation is trivial.  Mild aortic valve sclerosis is present, with no evidence of aortic valve  stenosis.  7. The inferior vena cava is dilated in size with <50% respiratory  variability, suggesting right atrial pressure of 15 mmHg.   Renal ultrasound 3/22 > No hydronephrosis or shadowing stone.  Micro Data:  OSH Urine culture 3/11 > Klebsiella (pan-susceptible), 10,000 CFU  Blood cultures 3/11 > negative  COVID 3/21 > Negative  Blood cultures 3/22 >   Antimicrobials:  S/p Ceftriaxone and azithromycin at OSH Cefepime 3/23 >>    Interim history/subjective:  Intermittently requiring BiPAP, wore it for 3 hours yesterday afternoon, off overnight, back on it this morning briefly.  Now on 12 L/min high flow nasal cannula. I/O- 3.7 L total Improving serum creatinine and LFT Current dobutamine 2.5  Objective   Blood pressure 115/65, pulse 100, temperature (!) 97.4 F (36.3 C), temperature source Axillary, resp. rate (!) 24, height '5\' 5"'  (1.651 m), weight 112 kg, SpO2 92 %.    FiO2 (%):  [40 %] 40 %   Intake/Output Summary (Last 24 hours) at 02/14/2020 0950 Last data filed at 02/14/2020 0900 Gross per 24 hour  Intake 1119.27 ml  Output 3750 ml  Net -2630.73 ml   Filed Weights   02/12/20 0501 02/13/20 0420 02/14/20 0428  Weight: 116.9 kg 116 kg 112 kg    Examination: General: Obese, chronically ill-appearing woman, currently on BiPAP, comfortable HEENT: Difficult to evaluate oropharynx, no upper airway noise Neuro: Awake, conversant, oriented, answers questions and follows commands CV: Irregular, distant, no murmur PULM: Decreased bilateral breath sounds, few inspiratory crackles both bases, BiPAP in place, no increased work of breathing GI: Obese, soft, nondistended with positive bowel  sounds Extremities: Trace pretibial edema Skin: No rash  Resolved Hospital Problem list   Hypoglycemia  Assessment & Plan:  79 yo woman with a history of pulmonary hypertension, diastolic heart failure, non-obstructive CAD, OSA, HTN, persistent Afib, obesity, and stage 3 CKD transferred to the MICU for metabolic acidosis (namely lactic acidosis), liver injury, AKI on CKD, in the setting of cardiogenic shock.    Metabolic acidosis, lactic acidosis, improving  Placed on bipap to increase minute ventilation.  Suspect underlying cause could be cardiogenic shock causing a low-flow state, with prerenal AKI and hepatic congestion (patient is cool and wet) leading to liver dysfunction.   -Bicarb 13, lactate 9 >> 7 >> 2.7 Plan: Follow BMP, renal function, lactate, urine output with restoration of adequate perfusion pressures. Dobutamine and diuresis as below BiPAP as needed for work of breathing associated with metabolic acidosis goal decrease frequency  Cardiogenic shock secondary to biventricular heart failure  Signs of RV more than LV dysfunction on previous echo at [place]: [place] cardiology assistance Continue dobutamine 2.5 with diuresis as she can tolerate hemodynamically.  Likely decrease Lasix dosing 3/24 and follow chest x-ray, renal function, BiPAP requirement Right heart catheterization in the next few days when we believe we are approaching euvolemia Remains on cefepime given risk  for possible sepsis.  Blood cultures negative, no clear source.  Plan to stop cefepime on 3/24 and follow clinically, fever curve, WBC.  Acute liver injury  Coagulopathy  INR 1.8 on 3/24 Plan: Follow LFT, INR Consider MRCP prior to discharge although suspect that this is principally due to hepatic congestion due to cardiogenic shock  Acute hypoxic respiratory failure:  Pulmonary edema in the setting of cardiogenic shock Plan: Continue diuresis as above, decrease BiPAP frequency as  she can tolerate. Follow chest x-ray Pulmonary hygiene  AKI on CKD Hyperkalemia, resolved Plan: Improving.  Appreciate nephrology assistance Follow urine output, BMP  Abdominal pain Constipation  Plan: Bowel regimen as ordered  Permanent A-fib Plan: Telemetry monitoring Home beta-blockade and apixaban on hold  Parkinson's: Home medications include  levo-dopa, carbidopa Plan: Continue home medications as ordered  Hypothyroidism:  Plan: Continue home levothyroxine   Best practice:  Diet: NPO Pain/Anxiety/Delirium protocol (if indicated): n/a VAP protocol (if indicated): n/a DVT prophylaxis: hold while INR >2 GI prophylaxis: n/a Glucose control: glucose q2h for hypoglycemia Mobility: when able Code Status: Full Family Communication: Updated patient and sister at bedside 3/24 Disposition: 2M ICU  Labs   CBC: Recent Labs  Lab 02/15/2020 2335 02/12/20 0558 02/12/20 0819 02/13/20 0846 02/14/20 0600  WBC 18.9* 17.6*  --  17.5* 16.7*  NEUTROABS 16.1*  --   --   --   --   HGB 14.1 12.9 12.6 12.1 12.3  HCT 45.3 42.4 37.0 37.6 37.8  MCV 97.4 98.1  --  93.3 92.9  PLT 254 258  --  177 235    Basic Metabolic Panel: Recent Labs  Lab 02/18/2020 2335 02/18/2020 2335 02/12/20 0558 02/12/20 0819 02/12/20 1444 02/12/20 1715 02/13/20 0846 02/13/20 1625 02/14/20 0600  NA 132*   < > 132*   < > 132* 133* 137 136 138  K 6.4*   < > 6.1*   < > 4.8 4.4 3.8 3.6 3.6  CL 100   < > 98   < > 101 101 102 100 104  CO2 10*   < > 13*   < > 20* 21* 20* 20* 23  GLUCOSE 66*   < > 111*   < > 170* 165* 133* 187* 160*  BUN 52*   < > 58*   < > 65* 66* 66* 63* 54*  CREATININE 2.36*   < > 2.90*   < > 2.59* 2.57* 2.15* 1.92* 1.43*  CALCIUM 9.5   < > 9.2   < > 8.6* 8.5* 8.5* 8.3* 8.3*  MG 2.1  --  2.0  --   --   --  1.8  --  1.5*  PHOS  --   --  6.9*  --   --   --  4.5  --  3.0   < > = values in this interval not displayed.   GFR: Estimated Creatinine Clearance: 41.7 mL/min (A) (by C-G  formula based on SCr of 1.43 mg/dL (H)). Recent Labs  Lab 01/26/2020 2335 02/12/20 0251 02/12/20 0252 02/12/20 0457 02/12/20 0558 02/12/20 0832 02/13/20 0846 02/14/20 0600  PROCALCITON  --   --  0.58  --   --   --   --   --   WBC 18.9*  --   --   --  17.6*  --  17.5* 16.7*  LATICACIDVEN  --  8.9*  --  9.2* 7.0* 2.7*  --   --     Liver Function Tests: Recent Labs  Lab 02/12/20 0931 02/12/20 1715 02/13/20 0846 02/13/20 1625 02/14/20 0600  AST 278* 246* 305* 291* 190*  ALT 189* 169* 261* 103* 101*  ALKPHOS 543* 478* 505* 484* 496*  BILITOT 2.5* 1.6* 1.9* 1.7* 1.9*  PROT 5.9* 5.8* 6.3* 6.2* 6.4*  ALBUMIN 2.4* 2.4* 2.5* 2.5* 2.5*   Recent Labs  Lab 02/12/20 0531  LIPASE 35   No results for input(s): AMMONIA in the last 168 hours.  ABG    Component Value Date/Time   PHART 7.369 02/12/2020 0819   PCO2ART 25.0 (L) 02/12/2020 0819   PO2ART 47.0 (L) 02/12/2020 0819   HCO3 14.4 (L) 02/12/2020 0819   TCO2 15 (L) 02/12/2020 0819   ACIDBASEDEF 9.0 (H) 02/12/2020 0819   O2SAT 83.0 02/12/2020 0819     Coagulation Profile: Recent Labs  Lab 01/24/2020 2335 02/12/20 0558 02/12/20 1715 02/13/20 0846 02/14/20 0600  INR 5.0* 6.2* 3.8* 2.6* 1.8*    Cardiac Enzymes: No results for input(s): CKTOTAL, CKMB, CKMBINDEX, TROPONINI in the last 168 hours.  HbA1C: Hgb A1c MFr Bld  Date/Time Value Ref Range Status  12/19/2014 06:45 AM 6.5 (H) <5.7 % Final    Comment:    (NOTE)                                                                       According to the ADA Clinical Practice Recommendations for 2011, when HbA1c is used as a screening test:  >=6.5%   Diagnostic of Diabetes Mellitus           (if abnormal result is confirmed) 5.7-6.4%   Increased risk of developing Diabetes Mellitus References:Diagnosis and Classification of Diabetes Mellitus,Diabetes THYH,8887,57(VJKQA 1):S62-S69 and Standards of Medical Care in         Diabetes - 2011,Diabetes SUOR,5615,37 (Suppl  1):S11-S61.     CBG: Recent Labs  Lab 02/13/20 1529 02/13/20 1923 02/13/20 2333 02/14/20 0317 02/14/20 0721  GLUCAP 162* 153* 156* 161* 145*     Critical care time: 33 minutes   Critical care was time spent personally by me on the following activities: development of treatment plan with patient and/or surrogate as well as nursing, discussions with consultants, evaluation of patient's response to treatment, examination of patient, obtaining history from patient or surrogate, ordering and performing treatments and interventions, ordering and review of laboratory studies, ordering and review of radiographic studies, pulse oximetry and re-evaluation of patient's condition.  Independent CC time 33 minutes  Baltazar Apo, MD, PhD 02/14/2020, 10:04 AM [facility] [phone] or if no answer [phone]

## 2020-02-14 NOTE — Progress Notes (Signed)
Placed on BIPAP due to increased WOB , SOB and desaturation during/post bath.

## 2020-02-14 NOTE — Progress Notes (Signed)
eLink Physician-Brief Progress Note Patient Name: GOLDY CALANDRA DOB: 07-18-1941 MRN: [id number]   Date of Service  02/14/2020  HPI/Events of Note  Pt requesting medication for knee pain, she's got renal as well as hepatic insufficiency.  eICU Interventions  Dilaudid liquid 0.5 mg po x 1 dose.        Thomasene Lot Laurita Peron 02/14/2020, 1:13 AM

## 2020-02-14 NOTE — Progress Notes (Signed)
5 beat run of vtach. MD notified.   Cranford Mon

## 2020-02-14 NOTE — Progress Notes (Signed)
Peripherally Inserted Central Catheter Placement  The IV Nurse has discussed with the patient and/or persons authorized to consent for the patient, the purpose of this procedure and the potential benefits and risks involved with this procedure.  The benefits include less needle sticks, lab draws from the catheter, and the patient may be discharged home with the catheter. Risks include, but not limited to, infection, bleeding, blood clot (thrombus formation), and puncture of an artery; nerve damage and irregular heartbeat and possibility to perform a PICC exchange if needed/ordered by physician.  Alternatives to this procedure were also discussed.  Bard Power PICC patient education guide, fact sheet on infection prevention and patient information card has been provided to patient /or left at bedside.    PICC Placement Documentation  PICC Triple Lumen 02/14/20 PICC Right Brachial 39 cm 0 cm (Active)  Indication for Insertion or Continuance of Line Vasoactive infusions 02/14/20 1700  Exposed Catheter (cm) 0 cm 02/14/20 1700  Site Assessment Clean;Dry;Intact 02/14/20 1700  Lumen #1 Status Flushed;No blood return 02/14/20 1700  Lumen #2 Status Flushed;Blood return noted 02/14/20 1700  Lumen #3 Status Flushed;Blood return noted 02/14/20 1700  Dressing Type Transparent 02/14/20 1700  Dressing Status Clean;Dry;Intact;Antimicrobial disc in place;Other (Comment) 02/14/20 1700  Dressing Intervention New dressing 02/14/20 1700  Dressing Change Due 02/21/20 02/14/20 1700       Reginia Forts Albarece 02/14/2020, 5:02 PM

## 2020-02-14 NOTE — Consult Note (Addendum)
Advanced Heart Failure Team Consult Note   Primary Physician: Richardean Chimera, MD PCP-Cardiologist:  Prentice Docker, MD  Reason for Consultation: Pulmonary HTN and Biventricular Heart Failure   HPI:    Leslie Alexander is seen today for evaluation of pulmonary HTN and biventricular heart failure at the request of Dr. Jacques Navy, General Cardiology.   79 y/o female w/ h/o chronic combined systolic and diastolic CHF (prior Echo 12/2018 showed LVEF 40-45%), mild nonobstructive CAD by cath 2/20, PAF on Eliquis for a/c, chronic LBBB, pulmonary HTN (moderate by RHC in 2020), morbid obesity, OSA noncompliant w/ CPAP, systemic hypertension and stage 3 CKD. Followed by Dr. Purvis Sheffield in [place].   She was transferred to [facility] from [facility] on 3/21 for further management of pulmonary HTN and RV dysfunction. Presenting symptoms upon admit to [facility] included SOB and edema. LFTs markedly elevated. Per records, she was also being treated at [facility] for Klebsiella UTI as well as possible PNA with Rocephin and Zithromax.   Upon transfer to [facility], she was in shock. Lactate 9, bicarb 10. CMP c/w AKI and shock liver. SCr up to 2.9. LFTs elevated.  INR elevated at 5. Was hypoxic and required BiPAP. Clinically felt to be in cardiogenic shock and volume overloaded. Started on dobutamine and lasix gtt. 2D echo showed moderately reduced LVEF at 35-40% w/ global hypokinesis, moderate LVH. RV moderately enlarged w/ moderately reduced systolic function. RVSP estimated at 49 mm Hg. Biatrial enlargement noted. LA moderately dilated. RA severely dilated. Mild MR and mild TR.   She is showing signs of clinical improvement. Remains on Dobutamine 2.5 mcg. SCr improving, 2.9>>1.43 today. LFTs improving. INR down to 1.8.  She has diuresed 11 lb since transfer but remains markedly volume overloaded. She is in chronic afib. Rates in the 110s.   Review of Systems: [y] = yes, [ ]  = no   . General: Weight gain [ ] ;  Weight loss [ ] ; Anorexia [ ] ; Fatigue [ ] ; Fever [ ] ; Chills [ ] ; Weakness [ ]   . Cardiac: Chest pain/pressure [ ] ; Resting SOB [ ] ; Exertional SOB [ ] ; Orthopnea [ ] ; Pedal Edema [ ] ; Palpitations [ ] ; Syncope [ ] ; Presyncope [ ] ; Paroxysmal nocturnal dyspnea[ ]   . Pulmonary: Cough [ ] ; Wheezing[ ] ; Hemoptysis[ ] ; Sputum [ ] ; Snoring [ ]   . GI: Vomiting[ ] ; Dysphagia[ ] ; Melena[ ] ; Hematochezia [ ] ; Heartburn[ ] ; Abdominal pain [ ] ; Constipation [ ] ; Diarrhea [ ] ; BRBPR [ ]   . GU: Hematuria[ ] ; Dysuria [ ] ; Nocturia[ ]   . Vascular: Pain in legs with walking [ ] ; Pain in feet with lying flat [ ] ; Non-healing sores [ ] ; Stroke [ ] ; TIA [ ] ; Slurred speech [ ] ;  . Neuro: Headaches[ ] ; Vertigo[ ] ; Seizures[ ] ; Paresthesias[ ] ;Blurred vision [ ] ; Diplopia [ ] ; Vision changes [ ]   . Ortho/Skin: Arthritis [ ] ; Joint pain [ ] ; Muscle pain [ ] ; Joint swelling [ ] ; Back Pain [ ] ; Rash [ ]   . Psych: Depression[ ] ; Anxiety[ ]   . Heme: Bleeding problems [ ] ; Clotting disorders [ ] ; Anemia [ ]   . Endocrine: Diabetes [ ] ; Thyroid dysfunction[ ]   Home Medications Prior to Admission medications   Medication Sig Start Date End Date Taking? Authorizing Provider  acetaminophen (TYLENOL) 325 MG tablet Take 650 mg by mouth 3 (three) times daily.   Yes [provider]  allopurinol (ZYLOPRIM) 300 MG tablet Take 300 mg daily by mouth. 09/16/17  Yes [provider]  apixaban (ELIQUIS) 5 MG TABS tablet Take 1 tablet (5 mg total) by mouth 2 (two) times daily. 01/08/16  Yes Laqueta Linden, MD  atorvastatin (LIPITOR) 20 MG tablet Take 1 tablet (20 mg total) by mouth daily at 6 PM. 01/20/19  Yes Noralee Stain, DO  carbidopa-levodopa (SINEMET IR) 10-100 MG tablet Take 1 tablet by mouth 2 (two) times daily.   Yes [provider]  DULoxetine (CYMBALTA) 60 MG capsule Take 60 mg by mouth daily.  07/13/18  Yes [provider]  fexofenadine (ALLEGRA) 180 MG tablet Take 180 mg by mouth daily.    Yes [provider]  gabapentin (NEURONTIN) 300 MG capsule Take 300 mg by mouth 3 (three) times daily.   Yes [provider]  levothyroxine (SYNTHROID, LEVOTHROID) 100 MCG tablet Take 100 mcg by mouth daily.   Yes [provider]  losartan (COZAAR) 25 MG tablet Take 25 mg by mouth daily.   Yes [provider]  metolazone (ZAROXOLYN) 2.5 MG tablet Take 2.5 mg by mouth See admin instructions. Take two tablets by mouth on Monday and Fridays. then take 1 tablet by mouth Tuesday, Wednesday, Thursday, Saturday, Sunday   Yes [provider]  metoprolol succinate (TOPROL-XL) 50 MG 24 hr tablet Take 50 mg by mouth daily. Take with or immediately following a meal.   Yes [provider]  Multiple Vitamins-Minerals (MULTIVITAMINS THER. W/MINERALS) TABS tablet Take 1 tablet by mouth daily.   Yes [provider]  multivitamin-lutein (OCUVITE-LUTEIN) CAPS capsule Take 1 capsule by mouth daily.   Yes [provider]  omeprazole (PRILOSEC) 20 MG capsule Take 20 mg by mouth daily.   Yes [provider]  polyethylene glycol powder (GLYCOLAX/MIRALAX) powder Take 17 g daily as needed by mouth. 09/16/17  Yes [provider]  potassium chloride SA (K-DUR,KLOR-CON) 20 MEQ tablet Take 1 tablet (20 mEq total) by mouth daily. Patient taking differently: Take 20 mEq by mouth 2 (two) times daily.  01/20/19  Yes Noralee Stain, DO  primidone (MYSOLINE) 50 MG tablet Take 50 mg by mouth 3 (three) times daily.    Yes [provider]  torsemide (DEMADEX) 20 MG tablet Take 2 tablets (40 mg total) by mouth 2 (two) times daily for 30 days. 01/20/19 02/12/20 Yes Noralee Stain, DO  traZODone (DESYREL) 50 MG tablet Take 50 mg by mouth at bedtime.  07/13/18  Yes [provider]    Past Medical History: Past Medical History:  Diagnosis Date  . Atrial fibrillation (HCC)   . CAD (coronary artery disease)    a. mild nonobstructive  CAD by cath in 11/2014. b. repeat cath in 12/2018 showing minor CAD; noted to have moderate pulmonary HTN  . Chronic combined systolic (congestive) and diastolic (congestive) heart failure (HCC)    a. EF reduced to 40-45% by echo in 12/2018  . Depression   . Essential hypertension, benign   . GERD (gastroesophageal reflux disease)   . History of breast cancer   . Hx of atrial fibrillation, no current medication    converted out of A. Fib with cardioversion  . Hypothyroidism   . Left bundle branch block    negative Lexiscan Myoview; EF 56%, 3/13   . Lumbar disc disease   . Meralgia paresthetica, right   . Mixed hyperlipidemia   . Morbid obesity (HCC)   . Nonalcoholic fatty liver disease   . OSA (obstructive sleep apnea)    CPAP machine  .  Pulmonary hypertension (HCC)    RVSP 55-60 mm mercury    Past Surgical History: Past Surgical History:  Procedure Laterality Date  . CATARACT EXTRACTION W/PHACO Left 12/18/2013   Procedure: CATARACT EXTRACTION PHACO AND INTRAOCULAR LENS PLACEMENT (IOC);  Surgeon: Gemma Payor, MD;  Location: [facility];  Service: Ophthalmology;  Laterality: Left;  CDE 12.70  . CATARACT EXTRACTION, BILATERAL  04/19/2008   Dr. Alto Denver  . FEMORAL EXPLORATION  12/2003   RIGHT LATERAL FEMORAL CUTANEOUS NERVE/Dr. Channing Mutters  . HEEL SPUR SURGERY  07/2006   For spurs/Dr. Ulice Brilliant  . INCISION AND DRAINAGE OF WOUND  01/26/2012   Dr. Chaney Malling  . KNEE ARTHROSCOPY  10/2005   Left knee/from torn cartilage Dr. Edger House  . LAPAROSCOPIC CHOLECYSTECTOMY  03/02/2011   Dr. Gabriel Cirri  . LEFT HEART CATHETERIZATION WITH CORONARY ANGIOGRAM N/A 12/20/2014   Procedure: LEFT HEART CATHETERIZATION WITH CORONARY ANGIOGRAM;  Surgeon: Peter M Swaziland, MD;  Location: [facility] CATH LAB;  Service: Cardiovascular;  Laterality: N/A;  . MASTECTOMY, RADICAL Left 1989   With chemotherapy  . PARS PLANA VITRECTOMY W/ REPAIR OF MACULAR HOLE  05/2008   Dr. Luciana Axe  . RIGHT/LEFT HEART CATH AND CORONARY ANGIOGRAPHY N/A  01/18/2019   Procedure: RIGHT/LEFT HEART CATH AND CORONARY ANGIOGRAPHY;  Surgeon: Swaziland, Peter M, MD;  Location: [facility] INVASIVE CV LAB;  Service: Cardiovascular;  Laterality: N/A;  . TOTAL KNEE ARTHROPLASTY Left 12/29/2011   Dr. Chaney Malling  . TUBAL LIGATION     BILATERAL    Family History: Family History  Problem Relation Age of Onset  . Other Father        Ulcer disease  . COPD Mother   . Breast cancer Sister     Social History: Social History   Socioeconomic History  . Marital status: Widowed    Spouse name: Not on file  . Number of children: 1  . Years of education: 42  . Highest education level: High school graduate  Occupational History  . Occupation: retired Engineer, petroleum  Tobacco Use  . Smoking status: Former Smoker    Packs/day: 0.50    Years: 10.00    Pack years: 5.00    Types: Cigarettes    Start date: 06/03/1965    Quit date: 11/24/1987    Years since quitting: 32.2  . Smokeless tobacco: Never Used  Substance and Sexual Activity  . Alcohol use: No    Alcohol/week: 0.0 standard drinks  . Drug use: No  . Sexual activity: Never  Other Topics Concern  . Not on file  Social History Narrative   Has 1 daughter that lives with her in a one story home.  Retired Armed forces training and education officer.  Education: high school.    Social Determinants of Health   Financial Resource Strain:   . Difficulty of Paying Living Expenses:   Food Insecurity:   . Worried About Programme researcher, broadcasting/film/video in the Last Year:   . Barista in the Last Year:   Transportation Needs:   . Freight forwarder (Medical):   Marland Kitchen Lack of Transportation (Non-Medical):   Physical Activity:   . Days of Exercise per Week:   . Minutes of Exercise per Session:   Stress:   . Feeling of Stress :   Social Connections:   . Frequency of Communication with Friends and Family:   . Frequency of Social Gatherings with Friends and Family:   . Attends Religious Services:   . Active Member of Clubs or  Organizations:   .  Attends Banker Meetings:   Marland Kitchen Marital Status:     Allergies:  Allergies  Allergen Reactions  . Codeine Palpitations  . Ace Inhibitors Cough  . Latex Rash    Objective:    Vital Signs:   Temp:  [97.4 F (36.3 C)-98.3 F (36.8 C)] 97.4 F (36.3 C) (03/24 1119) Pulse Rate:  [25-114] 108 (03/24 1100) Resp:  [13-29] 15 (03/24 1100) BP: (96-129)/(60-78) 112/70 (03/24 1100) SpO2:  [85 %-100 %] 96 % (03/24 1100) FiO2 (%):  [40 %] 40 % (03/23 2046) Weight:  [151 kg] 112 kg (03/24 0428) Last BM Date: 02/14/20  Weight change: Filed Weights   02/12/20 0501 02/13/20 0420 02/14/20 0428  Weight: 116.9 kg 116 kg 112 kg    Intake/Output:   Intake/Output Summary (Last 24 hours) at 02/14/2020 1246 Last data filed at 02/14/2020 1140 Gross per 24 hour  Intake 1084.88 ml  Output 3700 ml  Net -2615.12 ml      Physical Exam    General:  Chronically ill appearing and morbidly obese WF. No resp difficulty HEENT: normal Neck: supple. Elevated JVP to ear . Carotids 2+ bilat; no bruits. No lymphadenopathy or thyromegaly appreciated. Cor: PMI nondisplaced. irregularly irregular rhythm, mildly tachy rate. No rubs, gallops or murmurs. Lungs: decreased BS at the bases Abdomen: obese, soft, nontender, nondistended. No hepatosplenomegaly. No bruits or masses. Good bowel sounds. Extremities: no cyanosis, clubbing, rash, obese LEs w 2+ bilateral LEE edema Neuro: alert & orientedx3, cranial nerves grossly intact. moves all 4 extremities w/o difficulty. Affect pleasant   Telemetry   Atrial fibrillation low 100s-110s   EKG    Afib w/ RVR 106 bpm, LBBB   Labs   Basic Metabolic Panel: Recent Labs  Lab 02/12/2020 2335 01/24/2020 2335 02/12/20 0558 02/12/20 0819 02/12/20 1444 02/12/20 1444 02/12/20 1715 02/12/20 1715 02/13/20 0846 02/13/20 1625 02/14/20 0600  NA 132*   < > 132*   < > 132*  --  133*  --  137 136 138  K 6.4*   < > 6.1*   < > 4.8  --  4.4   --  3.8 3.6 3.6  CL 100   < > 98   < > 101  --  101  --  102 100 104  CO2 10*   < > 13*   < > 20*  --  21*  --  20* 20* 23  GLUCOSE 66*   < > 111*   < > 170*  --  165*  --  133* 187* 160*  BUN 52*   < > 58*   < > 65*  --  66*  --  66* 63* 54*  CREATININE 2.36*   < > 2.90*   < > 2.59*  --  2.57*  --  2.15* 1.92* 1.43*  CALCIUM 9.5   < > 9.2   < > 8.6*   < > 8.5*   < > 8.5* 8.3* 8.3*  MG 2.1  --  2.0  --   --   --   --   --  1.8  --  1.5*  PHOS  --   --  6.9*  --   --   --   --   --  4.5  --  3.0   < > = values in this interval not displayed.    Liver Function Tests: Recent Labs  Lab 02/12/20 0931 02/12/20 1715 02/13/20 0846 02/13/20 1625 02/14/20 0600  AST 278* 246*  305* 291* 190*  ALT 189* 169* 261* 103* 101*  ALKPHOS 543* 478* 505* 484* 496*  BILITOT 2.5* 1.6* 1.9* 1.7* 1.9*  PROT 5.9* 5.8* 6.3* 6.2* 6.4*  ALBUMIN 2.4* 2.4* 2.5* 2.5* 2.5*   Recent Labs  Lab 02/12/20 0531  LIPASE 35   No results for input(s): AMMONIA in the last 168 hours.  CBC: Recent Labs  Lab 02-17-20 2335 02/12/20 0558 02/12/20 0819 02/13/20 0846 02/14/20 0600  WBC 18.9* 17.6*  --  17.5* 16.7*  NEUTROABS 16.1*  --   --   --   --   HGB 14.1 12.9 12.6 12.1 12.3  HCT 45.3 42.4 37.0 37.6 37.8  MCV 97.4 98.1  --  93.3 92.9  PLT 254 258  --  177 160    Cardiac Enzymes: No results for input(s): CKTOTAL, CKMB, CKMBINDEX, TROPONINI in the last 168 hours.  BNP: BNP (last 3 results) Recent Labs    02/12/20 0531 02/13/20 0846 02/14/20 0600  BNP 1,448.7* 503.9* 473.5*    ProBNP (last 3 results) No results for input(s): PROBNP in the last 8760 hours.   CBG: Recent Labs  Lab 02/13/20 1529 02/13/20 1923 02/13/20 2333 02/14/20 0317 02/14/20 0721  GLUCAP 162* 153* 156* 161* 145*    Coagulation Studies: Recent Labs    Feb 17, 2020 2335 02/12/20 0558 02/12/20 1715 02/13/20 0846 02/14/20 0600  LABPROT 46.5* 54.9* 37.5* 27.7* 21.0*  INR 5.0* 6.2* 3.8* 2.6* 1.8*     Imaging   VAS  [place] UPPER EXTREMITY VENOUS DUPLEX  Result Date: 02/13/2020 UPPER VENOUS STUDY  Indications: Swelling, and bruising Limitations: Body habitus, bandages, line and poor ultrasound/tissue interface. Comparison Study: No prior exam. Performing Technologist: Kennedy Bucker ARDMS, RVT  Examination Guidelines: A complete evaluation includes B-mode imaging, spectral Doppler, color Doppler, and power Doppler as needed of all accessible portions of each vessel. Bilateral testing is considered an integral part of a complete examination. Limited examinations for reoccurring indications may be performed as noted.  Right Findings: +----------+------------+---------+-----------+----------+---------------------+ RIGHT     CompressiblePhasicitySpontaneousProperties       Summary        +----------+------------+---------+-----------+----------+---------------------+ IJV           Full       Yes       Yes                                    +----------+------------+---------+-----------+----------+---------------------+ Subclavian    Full       Yes       Yes               IV line visualized   +----------+------------+---------+-----------+----------+---------------------+ Axillary      Full       Yes       Yes               IV line visualized   +----------+------------+---------+-----------+----------+---------------------+ Brachial      Full       Yes       Yes               IV line visualized   +----------+------------+---------+-----------+----------+---------------------+ Radial        Full                                  visualized with color +----------+------------+---------+-----------+----------+---------------------+ Ulnar  Full                                  visualized with color +----------+------------+---------+-----------+----------+---------------------+ Cephalic      Full                                                         +----------+------------+---------+-----------+----------+---------------------+ Basilic                                                Not visualized     +----------+------------+---------+-----------+----------+---------------------+  Summary:  Right: No evidence of deep vein thrombosis in the upper extremity. No evidence of superficial vein thrombosis in the upper extremity. Basilic vein not visualized. Limited visualization of radial and ulnar veins due to small caliber.  *See table(s) above for measurements and observations.  Diagnosing physician: Waverly Ferrari MD Electronically signed by Waverly Ferrari MD on 02/13/2020 at 7:19:52 PM.    Final       Medications:     Current Medications: . allopurinol  100 mg Oral Daily  . carbidopa-levodopa  1 tablet Oral BID  . chlorhexidine  15 mL Mouth Rinse BID  . Chlorhexidine Gluconate Cloth  6 each Topical Q0600  . DULoxetine  60 mg Oral Daily  . furosemide  80 mg Intravenous TID  . levothyroxine  100 mcg Oral Q0600  . mouth rinse  15 mL Mouth Rinse q12n4p  . potassium chloride  40 mEq Oral BID     Infusions: . DOBUTamine 2.5 mcg/kg/min (02/14/20 1100)  . heparin 1,200 Units/hr (02/14/20 1140)       Assessment/Plan   1. Biventricular Heart Failure: 2D echo in 2016 showed normal LVEF and normal RV. Repeat Echo 12/2018 showed mildly reduced LVEF, 40-45% and mildly reduced RV systolic function. RHC in 12/2018 showed moderate pulmonary HTN, mildly elevated LV filling pressures and normal CO. CI 2.66. LHC showed minor obstructive disease. Recently admitted to [facility] 3/21 for NYHA Class IIIb symptoms and marked volume overload. No improvement w/ attempts at diuresis and transferred to [facility] for further care. In cardiogenic shock on arrival w/ AKI, elevated LFT, Lactate of 9 and pulmonary edema. Placed on dobutamine and IV Lasix w/ clinical improvement but remains markedly volume overloaded. Echo here at [facility] showed moderately reduced  LVEF at 35-40% w/ global hypokinesis, moderate LVH. RV moderately enlarged w/ moderately reduced systolic function. RVSP estimated at 49 mm Hg. Biatrial enlargement noted. LA moderately dilated. RA severely dilated. Mild MR and mild TR. SCr improving, down from peak of 2.9>>1.43.  - Needs further diuresis. Change to Lasix gtt 12 mg/hr. - Add Unna boots  - Continue dobutamine to help w/ CO and continued diuresis - She has a PICC line. Will follow Co-ox and CVP - Plan RHC to further evaluate  - will need w/u to also exclude TTR amyloid (Biventricular failure, LVH, conduction abnormalities including atrial fibrillation and LBBB, biatrial enlargement, lumbar disease/ spinal stenosis). No low voltage on EKG so doubt AL amyloid.  - EF outside window for ICD/ CRTD - will ultimately try to start GDMT for systolic HF, once off inotropes if BP/renal  function allows   2. Pulmonary HTN: RVSP estimated at 49 mm Hg on 2D Echo. 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  - Plan RHC to further assess - Needs to improve compliance w/ CPAP  - once more stable, may consider PFTs and V/Q scan   3. Chronic Atrial Fibrillation: - rates in the 110s on tele.  - monitor while on dobutamine. Can use IV amiodarone if needed for rate control.  - on Eliquis pta. Currently on IV heparin  4. CAD: - mild nonobstructive CAD by cath 12/2018  - Hs troponins normal x 3 - No ASA due to Eliquis - no statin given elevated LFTs   4. OSA: - needs to improve compliance w/ CPAP   5. Obesity: Body mass index is 41.09 kg/m.   6. AKI:  - suspect cardiorenal  - improving w/ inotropes/diuresis  - follow BMP  7. Elevated LFTs - 2/2 hepatic congestion from low output HF/RV failure - improving w/ inotropes/ diuresis  - home atorvastatin on hold   Length of Stay: 3  Leslie Jester, PA-C  02/14/2020, 12:46 PM  Advanced Heart Failure Team Pager [phone] (M-F; 7a -  4p)  Please contact [facility] for night-coverage after hours (4p -7a ) and weekends on [email or web address]  Patient seen with PA, agree with the above note.   History as described above. Patient with biventricular failure, currently in atrial fibrillation with mild RVR.  She is on dobutamine gtt at 2.5 and heparin gtt.  Getting Lasix 80 mg IV every 8 hrs, good UOP so far.  Has single-lumen PICC.   General: NAD Neck: JVP 14 cm, no thyromegaly or thyroid nodule.  Lungs: Decreased at bases.  CV: Nondisplaced PMI.  Heart mildly tachy, irregular S1/S2, no S3/S4, 2/6 HSM LLSB.  1+ edema to thighs.  Abdomen: Soft, nontender, no hepatosplenomegaly, no distention.  Skin: Intact without lesions or rashes.  Neurologic: Alert and oriented x 3.  Psych: Normal affect. Extremities: No clubbing or cyanosis.  HEENT: Normal.   1. 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  Cardiogenic shock initially, started on dobutamine 2.5.  Creatinine improving and she is diuresing. Echo this admission with EF 35-40%, diffuse hypokinesis, moderate LVH, D-shaped interventricular septum with moderate RV enlargement and moderately decreased RV systolic function, PASP 49 mmHg.  On exam, she remains volume overloaded.  Creatinine is trending down now that she is on dobutamine.  - She has single-lumen PICC.  Would switch for dual lumen PICC. Follow CVP and co-ox.   - Continue dobutamine 2.5 for now.  - Continue Lasix, will use gtt at 12 mg/hr.   - Eventual RHC when better diuresed, possibly Friday.  - Would consider cardiac amyloidosis here, eventual PYP scan would be reasonable.  2. Atrial fibrillation: Patient is in chronic atrial fibrillation, looks like since 2017 based on ECG in system.  She says that she had cardioversions in the past that worked, but that seems to have been a while ago.  She has been on Eliquis at home.  - Continue heparin gtt.  - HR is  elevated on dobutamine, will use amiodarone gtt for rate control for now.   - Can consider re-attempt at cardioversion on amiodarone though she appears to have been in atrial fibrillation for at least a couple years now.  3. RV failure/pulmonary hypertension: Moderately dysfunctional RV on echo this admission with  D-shaped septum.  Primarily pulmonary venous hypertension on 2/20 cath (PVR 3.7 WU).  Suspect OSA plays a role.  Not sure how compliant she has been with Eliquis, so will need to consider chronic PEs.  - Diuresis as above.  - V/Q scan rule out chronic PE.  - Will need RHC when better-diuresed.  4. AKI on CKD 3: Suspect cardiorenal.  Improved on dobutamine, creatinine down to 1.43.  5. Elevated LFTs: Suspect congestive hepatopathy.  AST/ALT have improved, AP remains stable.   Marca Ancona 02/14/2020 3:02 PM

## 2020-02-14 NOTE — Progress Notes (Signed)
Around 2200 the patient's IV pump alarmed "air in line" for the Lasix gtt. I went in and found the bag to be empty. I had just hung the bag around 2000 and it was programmed to infuse at 12 mL/hr. I thoroughly assessed the pump with my charge nurse, and found that it was programmed correctly. The pump was immediately removed from the room and a tag has been placed on it. After looking into the situation more, I noticed that a bag had been hung around 1800 and was dry around 2000. The patient received 500 mg of lasix total. Pharmacy is aware and elink has been notified. Will continue to monitor patient closely. No changes in vital signs. Will obtain serial BMPs to monitor any changes in electrolytes.   Jacobo Forest, RN

## 2020-02-15 ENCOUNTER — Inpatient Hospital Stay (HOSPITAL_COMMUNITY): Payer: Medicare PPO

## 2020-02-15 DIAGNOSIS — J9601 Acute respiratory failure with hypoxia: Secondary | ICD-10-CM | POA: Diagnosis not present

## 2020-02-15 DIAGNOSIS — I482 Chronic atrial fibrillation, unspecified: Secondary | ICD-10-CM | POA: Diagnosis not present

## 2020-02-15 DIAGNOSIS — N179 Acute kidney failure, unspecified: Secondary | ICD-10-CM | POA: Diagnosis not present

## 2020-02-15 DIAGNOSIS — I5043 Acute on chronic combined systolic (congestive) and diastolic (congestive) heart failure: Secondary | ICD-10-CM | POA: Diagnosis not present

## 2020-02-15 LAB — COOXEMETRY PANEL
Carboxyhemoglobin: 1.4 % (ref 0.5–1.5)
Carboxyhemoglobin: 0.6 % (ref 0.5–1.5)
Carboxyhemoglobin: 1.5 % (ref 0.5–1.5)
Carboxyhemoglobin: 1.3 % (ref 0.5–1.5)
Methemoglobin: 1.2 % (ref 0.0–1.5)
Methemoglobin: 0.6 % (ref 0.0–1.5)
Methemoglobin: 1.4 % (ref 0.0–1.5)
Methemoglobin: 1.2 % (ref 0.0–1.5)
O2 Saturation: 56.5 %
O2 Saturation: 91 %
O2 Saturation: 69.8 %
O2 Saturation: 53.7 %
Total hemoglobin: 12.6 g/dL (ref 12.0–16.0)
Total hemoglobin: 12.1 g/dL (ref 12.0–16.0)
Total hemoglobin: 11.8 g/dL — ABNORMAL LOW (ref 12.0–16.0)
Total hemoglobin: 13.2 g/dL (ref 12.0–16.0)

## 2020-02-15 LAB — CBC
HCT: 39.2 % (ref 36.0–46.0)
Hemoglobin: 12.2 g/dL (ref 12.0–15.0)
MCH: 29.5 pg (ref 26.0–34.0)
MCHC: 31.1 g/dL (ref 30.0–36.0)
MCV: 94.9 fL (ref 80.0–100.0)
Platelets: 138 10*3/uL — ABNORMAL LOW (ref 150–400)
RBC: 4.13 MIL/uL (ref 3.87–5.11)
RDW: 17.8 % — ABNORMAL HIGH (ref 11.5–15.5)
WBC: 14.7 10*3/uL — ABNORMAL HIGH (ref 4.0–10.5)
nRBC: 0 % (ref 0.0–0.2)

## 2020-02-15 LAB — BASIC METABOLIC PANEL
Anion gap: 11 (ref 5–15)
Anion gap: 13 (ref 5–15)
Anion gap: 14 (ref 5–15)
Anion gap: 14 (ref 5–15)
Anion gap: 15 (ref 5–15)
Anion gap: 15 (ref 5–15)
BUN: 39 mg/dL — ABNORMAL HIGH (ref 8–23)
BUN: 41 mg/dL — ABNORMAL HIGH (ref 8–23)
BUN: 41 mg/dL — ABNORMAL HIGH (ref 8–23)
BUN: 41 mg/dL — ABNORMAL HIGH (ref 8–23)
BUN: 42 mg/dL — ABNORMAL HIGH (ref 8–23)
BUN: 43 mg/dL — ABNORMAL HIGH (ref 8–23)
CO2: 24 mmol/L (ref 22–32)
CO2: 24 mmol/L (ref 22–32)
CO2: 24 mmol/L (ref 22–32)
CO2: 25 mmol/L (ref 22–32)
CO2: 27 mmol/L (ref 22–32)
CO2: 27 mmol/L (ref 22–32)
Calcium: 8.4 mg/dL — ABNORMAL LOW (ref 8.9–10.3)
Calcium: 8.5 mg/dL — ABNORMAL LOW (ref 8.9–10.3)
Calcium: 8.5 mg/dL — ABNORMAL LOW (ref 8.9–10.3)
Calcium: 8.6 mg/dL — ABNORMAL LOW (ref 8.9–10.3)
Calcium: 8.6 mg/dL — ABNORMAL LOW (ref 8.9–10.3)
Calcium: 8.6 mg/dL — ABNORMAL LOW (ref 8.9–10.3)
Chloride: 100 mmol/L (ref 98–111)
Chloride: 100 mmol/L (ref 98–111)
Chloride: 100 mmol/L (ref 98–111)
Chloride: 101 mmol/L (ref 98–111)
Chloride: 99 mmol/L (ref 98–111)
Chloride: 99 mmol/L (ref 98–111)
Creatinine, Ser: 1.07 mg/dL — ABNORMAL HIGH (ref 0.44–1.00)
Creatinine, Ser: 1.12 mg/dL — ABNORMAL HIGH (ref 0.44–1.00)
Creatinine, Ser: 1.14 mg/dL — ABNORMAL HIGH (ref 0.44–1.00)
Creatinine, Ser: 1.16 mg/dL — ABNORMAL HIGH (ref 0.44–1.00)
Creatinine, Ser: 1.3 mg/dL — ABNORMAL HIGH (ref 0.44–1.00)
Creatinine, Ser: 1.33 mg/dL — ABNORMAL HIGH (ref 0.44–1.00)
GFR calc Af Amer: 45 mL/min — ABNORMAL LOW (ref >60)
GFR calc Af Amer: 46 mL/min — ABNORMAL LOW (ref >60)
GFR calc Af Amer: 53 mL/min — ABNORMAL LOW (ref >60)
GFR calc Af Amer: 54 mL/min — ABNORMAL LOW (ref >60)
GFR calc Af Amer: 55 mL/min — ABNORMAL LOW (ref >60)
GFR calc Af Amer: 58 mL/min — ABNORMAL LOW (ref >60)
GFR calc non Af Amer: 39 mL/min — ABNORMAL LOW (ref >60)
GFR calc non Af Amer: 40 mL/min — ABNORMAL LOW (ref >60)
GFR calc non Af Amer: 46 mL/min — ABNORMAL LOW (ref >60)
GFR calc non Af Amer: 47 mL/min — ABNORMAL LOW (ref >60)
GFR calc non Af Amer: 48 mL/min — ABNORMAL LOW (ref >60)
GFR calc non Af Amer: 50 mL/min — ABNORMAL LOW (ref >60)
Glucose, Bld: 162 mg/dL — ABNORMAL HIGH (ref 70–99)
Glucose, Bld: 178 mg/dL — ABNORMAL HIGH (ref 70–99)
Glucose, Bld: 217 mg/dL — ABNORMAL HIGH (ref 70–99)
Glucose, Bld: 226 mg/dL — ABNORMAL HIGH (ref 70–99)
Glucose, Bld: 263 mg/dL — ABNORMAL HIGH (ref 70–99)
Glucose, Bld: 275 mg/dL — ABNORMAL HIGH (ref 70–99)
Potassium: 3.5 mmol/L (ref 3.5–5.1)
Potassium: 3.6 mmol/L (ref 3.5–5.1)
Potassium: 3.7 mmol/L (ref 3.5–5.1)
Potassium: 3.7 mmol/L (ref 3.5–5.1)
Potassium: 4.2 mmol/L (ref 3.5–5.1)
Potassium: 4.4 mmol/L (ref 3.5–5.1)
Sodium: 137 mmol/L (ref 135–145)
Sodium: 138 mmol/L (ref 135–145)
Sodium: 138 mmol/L (ref 135–145)
Sodium: 139 mmol/L (ref 135–145)
Sodium: 140 mmol/L (ref 135–145)
Sodium: 140 mmol/L (ref 135–145)

## 2020-02-15 LAB — PROTIME-INR
INR: 1.6 — ABNORMAL HIGH (ref 0.8–1.2)
Prothrombin Time: 19.2 seconds — ABNORMAL HIGH (ref 11.4–15.2)

## 2020-02-15 LAB — HEPARIN LEVEL (UNFRACTIONATED): Heparin Unfractionated: 1.1 IU/mL — ABNORMAL HIGH (ref 0.30–0.70)

## 2020-02-15 LAB — MAGNESIUM: Magnesium: 1.6 mg/dL — ABNORMAL LOW (ref 1.7–2.4)

## 2020-02-15 LAB — BRAIN NATRIURETIC PEPTIDE: B Natriuretic Peptide: 344.4 pg/mL — ABNORMAL HIGH (ref 0.0–100.0)

## 2020-02-15 LAB — GLUCOSE, CAPILLARY: Glucose-Capillary: 180 mg/dL — ABNORMAL HIGH (ref 70–99)

## 2020-02-15 LAB — APTT: aPTT: 95 seconds — ABNORMAL HIGH (ref 24–36)

## 2020-02-15 MED ORDER — MORPHINE SULFATE (PF) 4 MG/ML IV SOLN
4.0000 mg | INTRAVENOUS | Status: DC | PRN
  Administered 2020-02-15 – 2020-02-16 (×2): 4 mg via INTRAVENOUS
  Filled 2020-02-15 (×2): qty 1

## 2020-02-15 MED ORDER — LORAZEPAM 2 MG/ML IJ SOLN
0.5000 mg | Freq: Once | INTRAMUSCULAR | Status: AC
  Administered 2020-02-15: 0.5 mg via INTRAVENOUS
  Filled 2020-02-15: qty 1

## 2020-02-15 MED ORDER — MILRINONE LACTATE IN DEXTROSE 20-5 MG/100ML-% IV SOLN
0.2500 ug/kg/min | INTRAVENOUS | Status: DC
  Administered 2020-02-15 (×2): 0.25 ug/kg/min via INTRAVENOUS
  Filled 2020-02-15 (×3): qty 100

## 2020-02-15 MED ORDER — MAGNESIUM SULFATE 4 GM/100ML IV SOLN
4.0000 g | Freq: Once | INTRAVENOUS | Status: AC
  Administered 2020-02-15: 4 g via INTRAVENOUS
  Filled 2020-02-15: qty 100

## 2020-02-15 MED ORDER — DIGOXIN 125 MCG PO TABS
0.1250 mg | ORAL_TABLET | Freq: Every day | ORAL | Status: DC
  Administered 2020-02-15: 0.125 mg via ORAL
  Filled 2020-02-15 (×2): qty 1

## 2020-02-15 MED ORDER — LORAZEPAM 2 MG/ML IJ SOLN
2.0000 mg | INTRAMUSCULAR | Status: DC | PRN
  Administered 2020-02-15: 2 mg via INTRAVENOUS
  Filled 2020-02-15: qty 1

## 2020-02-15 MED ORDER — POTASSIUM CHLORIDE CRYS ER 20 MEQ PO TBCR
40.0000 meq | EXTENDED_RELEASE_TABLET | Freq: Once | ORAL | Status: AC
  Administered 2020-02-15: 40 meq via ORAL
  Filled 2020-02-15: qty 2

## 2020-02-15 MED ORDER — METOLAZONE 2.5 MG PO TABS
2.5000 mg | ORAL_TABLET | Freq: Once | ORAL | Status: AC
  Administered 2020-02-15: 2.5 mg via ORAL
  Filled 2020-02-15: qty 1

## 2020-02-15 MED ORDER — SPIRONOLACTONE 12.5 MG HALF TABLET
12.5000 mg | ORAL_TABLET | Freq: Every day | ORAL | Status: DC
  Administered 2020-02-15: 12.5 mg via ORAL
  Filled 2020-02-15 (×2): qty 1

## 2020-02-15 NOTE — Progress Notes (Signed)
Paged cardiology regarding lasix infusion. Hold Lasix for the rest of the night per MD. We will revisit in the morning after AM labs are resulted to determine if the drip will be restarted.   Jacobo Forest, RN

## 2020-02-15 NOTE — Progress Notes (Signed)
Pt was taken off BIPAP and placed on HHFNC to enable her to take an oral medication. Pt is back on BIPAP and still having increased WOB. RT increased setting to help pt w/WOB. Pt tolerating at this time but still struggling to breathe. RN informed RT that pts daughter will be coming to speak with MD tonight about pts GOC. RT will continue to monitor.

## 2020-02-15 NOTE — Progress Notes (Addendum)
NAME:  Leslie Alexander, MRN:  [id number], DOB:  Apr 20, 1941, LOS: 4 ADMISSION DATE:  02/06/2020, CONSULTATION DATE:  02/12/2020 REFERRING MD:  Hospitalist, CHIEF COMPLAINT:  Lactic acidosis   Brief History   79 yo F with a history of pulmonary hypertension, diastolic heart failure, non-obstructive CAD, OSA, HTN, persistent Afib, obesity, and stage 3 CKD who presented to [facility] 02/01/20 for shortness of breath and swelling, thought to be due to heart failure.  She was also being treated for a klebsiella UTI and possible pneumonia with ceftriaxone and azithromycin.   Patient was initially diuresed aggressively with IV lasix, but then developed hyponatremia, and diuresis was stopped and she was given normal saline.  She was also having issues with hyperkalemia over there and was given kayexalate.   History of present illness   Family wanted her transferred to [facility].  Over at [facility], she was having elevated LFTs, particularly the alk phos.  This evening, patient had an episode of hypoglycemia, which improved with dextrose.  Her labs returned with significant abnormalities including a significant lactic acidosis with lactate of 9, bicarb of 10, hyperkalemia, and INR of 5.  When seen at the bedside, patient is lethargic but overall but alert and oriented, satting 97% on 6L.  She feels unwell, but not in any pain except for mild diffuse abdominal pain, which she attributes to not having a bowel movement. Denies any chest pain and thinks his breathing is "alright."  Past Medical History   Past Medical History:  Diagnosis Date  . Atrial fibrillation ([place])   . CAD (coronary artery disease)    a. mild nonobstructive CAD by cath in 11/2014. b. repeat cath in 12/2018 showing minor CAD; noted to have moderate pulmonary HTN  . Chronic combined systolic (congestive) and diastolic (congestive) heart failure (HCC)    a. EF reduced to 40-45% by echo in 12/2018  . Depression   . Essential hypertension,  benign   . GERD (gastroesophageal reflux disease)   . History of breast cancer   . Hx of atrial fibrillation, no current medication    converted out of A. Fib with cardioversion  . Hypothyroidism   . Left bundle branch block    negative Lexiscan Myoview; EF 56%, 3/13   . Lumbar disc disease   . Meralgia paresthetica, right   . Mixed hyperlipidemia   . Morbid obesity ([place])   . Nonalcoholic fatty liver disease   . OSA (obstructive sleep apnea)    CPAP machine  . Pulmonary hypertension (HCC)    RVSP 55-60 mm mercury     Significant Hospital Events   3/21: Transferred from [facility]  3/22: Transferred to ICU for lactic acidosis  Consults:  Cardiology  Procedures:  3 lumen PICC 3/24 >>   Significant Diagnostic Tests:  RUQ doppler 3/9: patent portal vein, liver within normal limits  CT A/P w/o contrast: Nonobstructive left nephrolithiasis, fat stranding around pancreas (per [facility], this is chronic)  BNP: 3838 3/15 -->5965 3/21 Na: 3/15 125-->127-->120>123>125 (3/17)-->130 (3/18) --> 128 (3/20)--> 130 (3/21) Cr: 3/15 1.3-->1.58-->1.77-->1.56--> 0.9 --> 1.37 (3/20) -->1.63 (3/21)  ECHO 3/22 > 1. Left ventricular ejection fraction, by estimation, is 35 to 40%. The  left ventricle has severely decreased function. The left ventricle  demonstrates global hypokinesis. There is moderate left ventricular  hypertrophy. Left ventricular diastolic  function could not be evaluated. Left ventricular diastolic parameters are  indeterminate. There is the interventricular septum is flattened in  systole and diastole,  consistent with right ventricular pressure and  volume overload. Septal dyssynchrony is  significant.  2. Right ventricular systolic function is moderately reduced. The right  ventricular size is moderately enlarged. There is moderately elevated  pulmonary artery systolic pressure. The estimated right ventricular  systolic pressure is 16.3 mmHg.  3.  Left atrial size was moderately dilated.  4. Right atrial size was severely dilated.  5. The mitral valve is abnormal. Mild mitral valve regurgitation.  6. The aortic valve is abnormal. Aortic valve regurgitation is trivial.  Mild aortic valve sclerosis is present, with no evidence of aortic valve  stenosis.  7. The inferior vena cava is dilated in size with <50% respiratory  variability, suggesting right atrial pressure of 15 mmHg.   Renal ultrasound 3/22 > No hydronephrosis or shadowing stone.  Micro Data:  OSH Urine culture 3/11 > Klebsiella (pan-susceptible), 10,000 CFU  Blood cultures 3/11 > negative  COVID 3/21 > Negative  Blood cultures 3/22 >   Antimicrobials:  S/p Ceftriaxone and azithromycin at OSH Cefepime 3/23 >>    Interim history/subjective:  Wore BiPAP overnight last night and remains on it this morning with some transient desaturations Inadvertently received an increased dose of furosemide, approximately 500 mg due to pump malfunction.  Following BMP.  Lasix is currently held Heparin drip started Note drop in platelets, 138   Objective   Blood pressure 118/79, pulse (!) 116, temperature 98 F (36.7 C), temperature source Axillary, resp. rate (!) 38, height '5\' 5"'  (1.651 m), weight 112.3 kg, SpO2 (!) 89 %. CVP:  [16 mmHg-21 mmHg] 17 mmHg  FiO2 (%):  [50 %-70 %] 60 %   Intake/Output Summary (Last 24 hours) at 02/15/2020 1033 Last data filed at 02/15/2020 1000 Gross per 24 hour  Intake 1862.36 ml  Output 3640 ml  Net -1777.64 ml   Filed Weights   02/13/20 0420 02/14/20 0428 02/15/20 0324  Weight: 116 kg 112 kg 112.3 kg    Examination: General: Obese, chronically ill woman, currently on BiPAP, able to interact HEENT: No upper airway noise Neuro: Awake, somewhat globally weak, able to speak, oriented, follows commands CV: Irregular, distant, tachycardic, no murmur PULM: Decreased bilateral breath sounds, few bibasilar inspiratory crackles heard  laterally GI: Soft, nondistended, obese, positive bowel sounds Extremities: Trace upper and lower extremity edema Skin: Ecchymoses, no rash  Resolved Hospital Problem list   Hypoglycemia  Assessment & Plan:  79 yo woman with a history of pulmonary hypertension, diastolic heart failure, non-obstructive CAD, OSA, HTN, persistent Afib, obesity, and stage 3 CKD transferred to the MICU for metabolic acidosis (namely lactic acidosis), liver injury, AKI on CKD, in the setting of cardiogenic shock.    Metabolic acidosis, lactic acidosis, improving  Placed on bipap to increase minute ventilation.  Suspect underlying cause could be cardiogenic shock causing a low-flow state, with prerenal AKI and hepatic congestion (patient is cool and wet) leading to liver dysfunction.   -Bicarb 13, lactate 9 >> 7 >> 2.7 Plan: Following BMP, renal function, lactate, urine output with diuresis.  Lasix infusion started 3/24 but she received higher dose than intended, 500 mg.  Currently on hold and following BMP Dobutamine as ordered, reinitiate diuresis once we are sure that her renal function remains stable with the increased Lasix dose Continue BiPAP as needed for work of breathing.  Cardiogenic shock secondary to biventricular heart failure  Signs of RV more than LV dysfunction on previous echo at [facility].   Plan: Greatly this appreciate cardiology assistance  Continuing dobutamine and will restart furosemide once we are sure it safe to do so.  Continue BiPAP.  Titrate her diuretics and continue dobutamine based on respiratory status, renal function, chest x-ray Planning for right heart catheterization soon, tentatively planned for 3/26 Attempting to address contribution of OSA/OHS with nightly BiPAP Agree with ventilation perfusion scan to evaluate for chronic PE.  I will defer it for now as I think it will be a poor study given her pulmonary infiltrates, basilar atelectasis, etc. ultimately will need to be  reordered Cefepime stopped on 3/24   Acute liver injury improved Coagulopathy  INR 1.6 on 3/25 Plan: Continue to follow LFT, INR Consider MRCP prior to discharge although suspect that this is principally due to hepatic congestion due to cardiogenic shock  Acute hypoxic respiratory failure:  Pulmonary edema in the setting of cardiogenic shock Plan: BiPAP support as required, increase to 16/10 and wean FiO2 as able.  I believe that some of her progressive hypoxemia is due to atelectasis superimposed on her pulmonary edema.  Need to push mobility, pulmonary hygiene Follow chest x-ray  AKI on CKD.  At some risk for progression given increased Lasix dose 3/24-5 Hyperkalemia, resolved Plan: Stable at this time.  Nephrology assistance appreciated Follow urine output, BMP   Abdominal pain Constipation  Plan: Bowel regimen as ordered  Permanent A-fib, tachycardia being influenced by dobutamine, critical illness Plan: Telemetry monitoring Heparin infusion added 3/24 Amiodarone started on 3/24  Parkinson's: Home medications include  levo-dopa, carbidopa Plan: Home regimen as ordered  Hypothyroidism:  Plan: Hold levothyroxine   Best practice:  Diet: NPO Pain/Anxiety/Delirium protocol (if indicated): n/a VAP protocol (if indicated): n/a DVT prophylaxis: Heparin drip  GI prophylaxis: n/a Glucose control: glucose q2h for hypoglycemia Mobility: when able Code Status: Full Family Communication: Updated patient and sister at bedside 3/25 Disposition: 66M ICU  Labs   CBC: Recent Labs  Lab 02/02/2020 2335 02/19/2020 2335 02/12/20 0558 02/12/20 0819 02/13/20 0846 02/14/20 0600 02/15/20 0311  WBC 18.9*  --  17.6*  --  17.5* 16.7* 14.7*  NEUTROABS 16.1*  --   --   --   --   --   --   HGB 14.1   < > 12.9 12.6 12.1 12.3 12.2  HCT 45.3   < > 42.4 37.0 37.6 37.8 39.2  MCV 97.4  --  98.1  --  93.3 92.9 94.9  PLT 254  --  258  --  177 160 138*   < > = values in this interval  not displayed.    Basic Metabolic Panel: Recent Labs  Lab 02/15/2020 2335 01/28/2020 2335 02/12/20 0558 02/12/20 0819 02/13/20 0846 02/13/20 0846 02/13/20 1625 02/14/20 0600 02/14/20 2339 02/15/20 0311 02/15/20 0730  NA 132*   < > 132*   < > 137   < > 136 138 140 139 140  K 6.4*   < > 6.1*   < > 3.8   < > 3.6 3.6 3.5 3.6 3.7  CL 100   < > 98   < > 102   < > 100 104 100 101 100  CO2 10*   < > 13*   < > 20*   < > 20* '23 27 27 25  ' GLUCOSE 66*   < > 111*   < > 133*   < > 187* 160* 178* 162* 217*  BUN 52*   < > 58*   < > 66*   < > 63* 54* 43* 39*  41*  CREATININE 2.36*   < > 2.90*   < > 2.15*   < > 1.92* 1.43* 1.12* 1.07* 1.14*  CALCIUM 9.5   < > 9.2   < > 8.5*   < > 8.3* 8.3* 8.5* 8.6* 8.4*  MG 2.1  --  2.0  --  1.8  --   --  1.5*  --  1.6*  --   PHOS  --   --  6.9*  --  4.5  --   --  3.0  --   --   --    < > = values in this interval not displayed.   GFR: Estimated Creatinine Clearance: 52.4 mL/min (A) (by C-G formula based on SCr of 1.14 mg/dL (H)). Recent Labs  Lab 02/05/2020 2335 02/12/20 0251 02/12/20 0252 02/12/20 0457 02/12/20 0558 02/12/20 0832 02/13/20 0846 02/14/20 0600 02/15/20 0311  PROCALCITON  --   --  0.58  --   --   --   --   --   --   WBC   < >  --   --   --  17.6*  --  17.5* 16.7* 14.7*  LATICACIDVEN  --  8.9*  --  9.2* 7.0* 2.7*  --   --   --    < > = values in this interval not displayed.    Liver Function Tests: Recent Labs  Lab 02/12/20 0931 02/12/20 1715 02/13/20 0846 02/13/20 1625 02/14/20 0600  AST 278* 246* 305* 291* 190*  ALT 189* 169* 261* 103* 101*  ALKPHOS 543* 478* 505* 484* 496*  BILITOT 2.5* 1.6* 1.9* 1.7* 1.9*  PROT 5.9* 5.8* 6.3* 6.2* 6.4*  ALBUMIN 2.4* 2.4* 2.5* 2.5* 2.5*   Recent Labs  Lab 02/12/20 0531  LIPASE 35   No results for input(s): AMMONIA in the last 168 hours.  ABG    Component Value Date/Time   PHART 7.369 02/12/2020 0819   PCO2ART 25.0 (L) 02/12/2020 0819   PO2ART 47.0 (L) 02/12/2020 0819   HCO3 14.4  (L) 02/12/2020 0819   TCO2 15 (L) 02/12/2020 0819   ACIDBASEDEF 9.0 (H) 02/12/2020 0819   O2SAT 56.5 02/15/2020 0311     Coagulation Profile: Recent Labs  Lab 02/12/20 0558 02/12/20 1715 02/13/20 0846 02/14/20 0600 02/15/20 0311  INR 6.2* 3.8* 2.6* 1.8* 1.6*    Cardiac Enzymes: No results for input(s): CKTOTAL, CKMB, CKMBINDEX, TROPONINI in the last 168 hours.  HbA1C: Hgb A1c MFr Bld  Date/Time Value Ref Range Status  12/19/2014 06:45 AM 6.5 (H) <5.7 % Final    Comment:    (NOTE)                                                                       According to the ADA Clinical Practice Recommendations for 2011, when HbA1c is used as a screening test:  >=6.5%   Diagnostic of Diabetes Mellitus           (if abnormal result is confirmed) 5.7-6.4%   Increased risk of developing Diabetes Mellitus References:Diagnosis and Classification of Diabetes Mellitus,Diabetes TWSF,6812,75(TZGYF 1):S62-S69 and Standards of Medical Care in         Diabetes - 2011,Diabetes VCBS,4967,59 (Suppl 1):S11-S61.     CBG: Recent  Labs  Lab 02/13/20 1529 02/13/20 1923 02/13/20 2333 02/14/20 0317 02/14/20 0721  GLUCAP 162* 153* 156* 161* 145*     Critical care time: 32 minutes   Critical care was time spent personally by me on the following activities: development of treatment plan with patient and/or surrogate as well as nursing, discussions with consultants, evaluation of patient's response to treatment, examination of patient, obtaining history from patient or surrogate, ordering and performing treatments and interventions, ordering and review of laboratory studies, ordering and review of radiographic studies, pulse oximetry and re-evaluation of patient's condition.  Independent CC time 32 minutes  Baltazar Apo, MD, PhD 02/15/2020, 10:33 AM [facility] [phone] or if no answer [phone]

## 2020-02-15 NOTE — Progress Notes (Addendum)
ANTICOAGULATION CONSULT NOTE  Pharmacy Consult:  Heparin Indication: atrial fibrillation  Allergies  Allergen Reactions  . Codeine Palpitations  . Ace Inhibitors Cough  . Latex Rash    Patient Measurements: Height: 5\' 5"  (165.1 cm) Weight: 247 lb 9.2 oz (112.3 kg) IBW/kg (Calculated) : 57 Heparin Dosing Weight: 84 kg  Vital Signs: Temp: 97.6 F (36.4 C) (03/25 0350) Temp Source: Axillary (03/25 0350) BP: 122/75 (03/25 0600) Pulse Rate: 123 (03/25 0600)  Labs: Recent Labs    02/12/20 0931 02/12/20 1444 02/13/20 0846 02/13/20 0846 02/13/20 1625 02/13/20 2020 02/14/20 0600 02/14/20 1944 02/14/20 2339 02/15/20 0311  HGB  --   --  12.1   < >  --   --  12.3  --   --  12.2  HCT  --   --  37.6  --   --   --  37.8  --   --  39.2  PLT  --   --  177  --   --   --  160  --   --  138*  APTT  --   --   --   --   --   --  34 52*  --  95*  LABPROT  --    < > 27.7*  --   --   --  21.0*  --   --  19.2*  INR  --    < > 2.6*  --   --   --  1.8*  --   --  1.6*  HEPARINUNFRC  --   --   --   --   --   --  1.58*  --   --  1.10*  CREATININE 2.76*   < > 2.15*   < > 1.92*  --  1.43*  --  1.12* 1.07*  TROPONINIHS 15  --   --   --  10 10  --   --   --   --    < > = values in this interval not displayed.    Estimated Creatinine Clearance: 55.9 mL/min (A) (by C-G formula based on SCr of 1.07 mg/dL (H)).   Assessment: 89 YOF transferred from [facility] with SOB and swelling thought due to heart failure.  Patient was on Eliquis PTA for history of Afib, last dose on 2020/02/27 AM.  Patient has cardiogenic shock with hepatic congestion, so INR peaked at 6.2.  INR came down and Pharmacy consulted to manage IV heparin.  Will use aPTT to guide heparin dosing until aPTT and heparin level correlate.   APTT high normal and heparin level remains elevated.  No bleeding reported.  Goal of Therapy:  Heparin level 0.3-0.7 units/ml aPTT 66-102 seconds Monitor platelets by anticoagulation protocol: Yes    Plan:  Reduce heparin gtt slightly to 1300 units/hr Daily heparin level, aPTT and CBC Cardiac cath possibly Fri F/u VQ scan to r/o chronic PEs  Antonia Culbertson D. Mon, PharmD, BCPS, BCCCP 02/15/2020, 7:07 AM

## 2020-02-15 NOTE — Progress Notes (Signed)
Patient requiring increased ventilatory support with BIPAP, RR rate in the 40s.  Had a discussion with patient and daughter at their request.  After discussing aggressive care versus deescalation of care the patient wishes to be kept comfortable without CPR, intubation or vasopressors and be given sedation to be kept comfortable.  Will follow patient's wishes.

## 2020-02-15 NOTE — Progress Notes (Addendum)
Advanced Heart Failure Rounding Note  PCP-Cardiologist: Kate Sable, MD   Subjective:    Significant overnight event: Pt received 500 mg of IV Lasix total last PM due to IV pump error. Pump was programed to infuse IV Lasix at 12 mg/hr. Per RN note, bag was hung at 1800 and was dry around 2000. Lasix discontinued. Pump removed from operation.   VS remained stable overnight. No hypotension. Serial BMPs checked throughout the night to monitor SCr/K. Last BMP 0730 stable. SCr 1.14. K 3.7.   Wt up 1 lb. Total UOP charted -3.5L. CVP 17.   Remains on dobutamine 2.5. Co-ox marginal at 57%. SOB requiring BiPAP. Remains in rapid afib, 120s. On IV amiodarone and IV heparin.   Objective:   Weight Range: 112.3 kg Body mass index is 41.2 kg/m.   Vital Signs:   Temp:  [97.4 F (36.3 C)-98.4 F (36.9 C)] 98 F (36.7 C) (03/25 0732) Pulse Rate:  [64-123] 116 (03/25 0832) Resp:  [13-45] 38 (03/25 0832) BP: (108-130)/(53-88) 118/79 (03/25 0832) SpO2:  [86 %-96 %] 89 % (03/25 0832) FiO2 (%):  [50 %-70 %] 60 % (03/25 0832) Weight:  [112.3 kg] 112.3 kg (03/25 0324) Last BM Date: 02/14/20  Weight change: Filed Weights   02/13/20 0420 02/14/20 0428 02/15/20 0324  Weight: 116 kg 112 kg 112.3 kg    Intake/Output:   Intake/Output Summary (Last 24 hours) at 02/15/2020 0955 Last data filed at 02/15/2020 0800 Gross per 24 hour  Intake 2118.61 ml  Output 3490 ml  Net -1371.39 ml      Physical Exam    CVP 17 General:  Chronically ill appearing, obese WF on BiPAP. HEENT: Normal Neck: elevated. JVP to ear . Carotids 2+ bilat; no bruits. No lymphadenopathy or thyromegaly appreciated. Cor: PMI nondisplaced. irregularly irregular rhythm, tachy rate. No rubs, gallops or murmurs. Lungs: Decreased BS at the bases bilaterally  Abdomen: Obese, soft, nontender, nondistended. No hepatosplenomegaly. No bruits or masses. Good bowel sounds. Extremities: No cyanosis, clubbing, rash, obese lower  extremities w/ 2+ bilateral LEE edema + Un[place] boots Neuro: Alert & orientedx3, cranial nerves grossly intact. moves all 4 extremities w/o difficulty. Affect pleasant   Telemetry   Rapid Atrial Fibrillation 120s   Labs    CBC Recent Labs    02/14/20 0600 02/15/20 0311  WBC 16.7* 14.7*  HGB 12.3 12.2  HCT 37.8 39.2  MCV 92.9 94.9  PLT 160 242*   Basic Metabolic Panel Recent Labs    02/13/20 0846 02/13/20 1625 02/14/20 0600 02/14/20 2339 02/15/20 0311 02/15/20 0730  NA 137   < > 138   < > 139 140  K 3.8   < > 3.6   < > 3.6 3.7  CL 102   < > 104   < > 101 100  CO2 20*   < > 23   < > 27 25  GLUCOSE 133*   < > 160*   < > 162* 217*  BUN 66*   < > 54*   < > 39* 41*  CREATININE 2.15*   < > 1.43*   < > 1.07* 1.14*  CALCIUM 8.5*   < > 8.3*   < > 8.6* 8.4*  MG 1.8  --  1.5*  --  1.6*  --   PHOS 4.5  --  3.0  --   --   --    < > = values in this interval not displayed.   Liver Function Tests  Recent Labs    02/13/20 1625 02/14/20 0600  AST 291* 190*  ALT 103* 101*  ALKPHOS 484* 496*  BILITOT 1.7* 1.9*  PROT 6.2* 6.4*  ALBUMIN 2.5* 2.5*   No results for input(s): LIPASE, AMYLASE in the last 72 hours. Cardiac Enzymes No results for input(s): CKTOTAL, CKMB, CKMBINDEX, TROPONINI in the last 72 hours.  BNP: BNP (last 3 results) Recent Labs    02/13/20 0846 02/14/20 0600 02/15/20 0311  BNP 503.9* 473.5* 344.4*    ProBNP (last 3 results) No results for input(s): PROBNP in the last 8760 hours.   D-Dimer No results for input(s): DDIMER in the last 72 hours. Hemoglobin A1C No results for input(s): HGBA1C in the last 72 hours. Fasting Lipid Panel No results for input(s): CHOL, HDL, LDLCALC, TRIG, CHOLHDL, LDLDIRECT in the last 72 hours. Thyroid Function Tests No results for input(s): TSH, T4TOTAL, T3FREE, THYROIDAB in the last 72 hours.  Invalid input(s): FREET3  Other results:   Imaging    DG Chest Port 1 View  Result Date: 02/15/2020 CLINICAL DATA:   Respiratory failure.  Hypoxia. EXAM: PORTABLE CHEST 1 VIEW COMPARISON:  02/14/2020.  01/16/2019. FINDINGS: Right PICC line in stable position. Mediastinum is stable. Stable cardiomegaly. Diffuse severe bilateral interstitial prominence again noted without interim change. No pleural effusion or pneumothorax. Surgical clips left chest. IMPRESSION: 1.  Right PICC line stable position. 2.  Stable cardiomegaly. 3. Diffuse severe bilateral interstitial prominence again noted consistent interstitial edema and/or pneumonitis. No interim change. Electronically Signed   By: Maisie Fus  Register   On: 02/15/2020 05:42   DG Chest Port 1 View  Result Date: 02/14/2020 CLINICAL DATA:  Status post PICC. EXAM: PORTABLE CHEST 1 VIEW COMPARISON:  Chest radiograph 02/12/2020, most recent CT 12/19/2014. FINDINGS: Right upper extremity PICC tip projects over the mid SVC. No pneumothorax. Cardiomegaly is unchanged. Aortic atherosclerosis. Worsening reticular and patchy opacities throughout both lungs. No large pleural effusion. No pneumothorax. Surgical clips in the left axilla. No acute osseous abnormalities are seen. IMPRESSION: 1. Tip of the right upper extremity PICC projects over the mid SVC. 2. Worsening reticular and patchy opacities throughout both lungs which may be infectious or pulmonary edema. Cardiomegaly is stable. Aortic Atherosclerosis (ICD10-I70.0). Electronically Signed   By: Narda Rutherford M.D.   On: 02/14/2020 17:33   [place] EKG SITE RITE  Result Date: 02/14/2020 If Site Rite image not attached, placement could not be confirmed due to current cardiac rhythm.     Medications:     Scheduled Medications: . allopurinol  100 mg Oral Daily  . carbidopa-levodopa  1 tablet Oral BID  . chlorhexidine  15 mL Mouth Rinse BID  . Chlorhexidine Gluconate Cloth  6 each Topical Q0600  . DULoxetine  60 mg Oral Daily  . levothyroxine  100 mcg Oral Q0600  . LORazepam  0.5 mg Intravenous Once  . mouth rinse  15 mL Mouth  Rinse q12n4p  . sodium chloride flush  3 mL Intravenous Q12H     Infusions: . amiodarone 30 mg/hr (02/15/20 0010)  . DOBUTamine 2.5 mcg/kg/min (02/15/20 0800)  . furosemide (LASIX) infusion Stopped (02/14/20 2159)  . heparin 1,300 Units/hr (02/15/20 0800)     PRN Medications:  acetaminophen, traZODone    Assessment/Plan   1. 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  Cardiogenic shock initially, started on dobutamine 2.5. Echo this admission with EF 35-40%, diffuse hypokinesis, moderate LVH, D-shaped interventricular septum with moderate  RV enlargement and moderately decreased RV systolic function, PASP 49 mmHg. On exam, she remains volume overloaded. CVP 17. Creatinine is trending down now that she is on dobutamine. Lasix gtt currently on hold. Pt received 500 mg of IV Lasix total last PM due to IV pump error (see details above). SCr and K stable on serial BMPs. Will repeat another BMP at 1200. If SCr and K stable, will plan to resume Lasix gtt at 12 mg/hr (previous pump removed from operation).  - Co-ox marginal at 57% despite dobutamine 2.5. Titration limited by rapid afib. V-rates persistently in the 120s despite IV amiodarone. Now that renal function has improved, may need to switch off dobutamine to milrinone. Will defer to Dr. Shirlee Latch.  - Eventual RHC when better diuresed, possibly tomorrow (tenatatively scheduled for 7:30 am). May need to rescheduled for Monday.   - Would consider cardiac amyloidosis here, eventual PYP scan would be reasonable.  2. Atrial fibrillation: Patient is in chronic atrial fibrillation, looks like since 2017 based on ECG in system.  She says that she had cardioversions in the past that worked, but that seems to have been a while ago.  She has been on Eliquis at home.  - Continue heparin gtt.  - HR is elevated on dobutamine, in the 120s despite use of amiodarone gtt for rate control. -  May need to stop dobutamine and change to milrinone  - Can consider re-attempt at cardioversion on amiodarone though she appears to have been in atrial fibrillation for at least a couple years now.  3. RV failure/pulmonary hypertension: Moderately dysfunctional RV on echo this admission with D-shaped septum.  Primarily pulmonary venous hypertension on 2/20 cath (PVR 3.7 WU).  Suspect OSA plays a role.  Not sure how compliant she has been with Eliquis, so will need to consider chronic PEs.  - Diuresis as above.  - V/Q scan rule out chronic PE, when more stable.  - Will need RHC when better-diuresed.  4. AKI on CKD 3: Suspect cardiorenal.  Improved on dobutamine, creatinine down to 1.14. Continue serial BMPs given high dose Lasix received overnight.  5. Elevated LFTs: Suspect congestive hepatopathy.  AST/ALT have improved, AP remains stable.   Length of Stay: [address] PA-C  02/15/2020, 9:55 AM  Advanced Heart Failure Team Pager [phone] (M-F; 7a - 4p)  Please contact [facility] for night-coverage after hours (4p -7a ) and weekends on [email or web address]  Patient seen with PA, agree with the above note.    Received 500 mg IV Lasix last night due to pump malfunction, Lasix has been off until now. Creatinine remained stable, 1.1 this morning.  She is now on Lasix gtt at 12 mg/hr, amiodarone gtt, heparin gtt, milrinone 0.25.  She remains in atrial fibrillation with RVR in 120s.  CVP 15 currently. BP stable.   CXR with pulmonary edema.   She is on Bipap.   General: NAD, bipap Neck: JVP 14-15, no thyromegaly or thyroid nodule.  Lungs: Decreased BS at bases.  CV: Nondisplaced PMI.  Heart tachy, irregular S1/S2, no S3/S4, no murmur.  2+ edema to knees.  Abdomen: Soft, nontender, no hepatosplenomegaly, no distention.  Skin: Intact without lesions or rashes.  Neurologic: Alert and oriented x 3.  Psych: Normal affect. Extremities: No clubbing or cyanosis.  HEENT: Normal.   She remains  volume overloaded, has just started back on Lasix 12 mg/hr.  CVP 15, will give a dose of metolazone 2.5 mg x 1 now and increase  Lasix to 15 mg/hr to try to stimulate diuresis as she seems more short of breath.  Continue milrinone 0.25, resend co-ox.  Will give spironolactone 12.5 mg daily and digoxin 0.125 daily (may help with HR as well). RHC planned for tomorrow if she is stable (hold heparin gtt prior).   She remains in atrial fibrillation with RVR, rate around 120.   - Increase amiodarone gtt to 60 mg/hr - Add digoxin 0.125 daily.  - She is on heparin gtt.   CRITICAL CARE Performed by: Marca Ancona  Total critical care time: 40 minutes  Critical care time was exclusive of separately billable procedures and treating other patients.  Critical care was necessary to treat or prevent imminent or life-threatening deterioration.  Critical care was time spent personally by me on the following activities: development of treatment plan with patient and/or surrogate as well as nursing, discussions with consultants, evaluation of patient's response to treatment, examination of patient, obtaining history from patient or surrogate, ordering and performing treatments and interventions, ordering and review of laboratory studies, ordering and review of radiographic studies, pulse oximetry and re-evaluation of patient's condition.  Marca Ancona 02/15/2020 5:21 PM

## 2020-02-15 NOTE — Progress Notes (Signed)
MD asked for patient to be given trial off of bipap and placed on heated high flow nasal cannula.  Took patient off of bipap and sats dropped to 78% prior to placing on high flow.  Placed patient on high flow at 30L and 100%.  When patient had closed her lips together patient's sats began to slowly increase however patient began stating to RT that she couldn't handle the heated high flow.   Patient placed back on bipap and is tolerating well.  MD notified.  RT will continue to monitor.

## 2020-02-16 DIAGNOSIS — I5043 Acute on chronic combined systolic (congestive) and diastolic (congestive) heart failure: Secondary | ICD-10-CM | POA: Diagnosis not present

## 2020-02-16 DIAGNOSIS — J9601 Acute respiratory failure with hypoxia: Secondary | ICD-10-CM | POA: Diagnosis not present

## 2020-02-16 SURGERY — RIGHT HEART CATH
Anesthesia: LOCAL

## 2020-02-16 MED ORDER — GLYCOPYRROLATE 1 MG PO TABS: 1.0000 mg | ORAL_TABLET | ORAL | Status: DC | PRN

## 2020-02-16 MED ORDER — MIDAZOLAM HCL 2 MG/2ML IJ SOLN
2.0000 mg | INTRAMUSCULAR | Status: DC | PRN
  Administered 2020-02-16: 2 mg via INTRAVENOUS
  Filled 2020-02-16: qty 2

## 2020-02-16 MED ORDER — POLYVINYL ALCOHOL 1.4 % OP SOLN
1.0000 [drp] | Freq: Four times a day (QID) | OPHTHALMIC | Status: DC | PRN
  Filled 2020-02-16: qty 15

## 2020-02-16 MED ORDER — DIPHENHYDRAMINE HCL 50 MG/ML IJ SOLN: 25.0000 mg | INTRAMUSCULAR | Status: DC | PRN

## 2020-02-16 MED ORDER — GLYCOPYRROLATE 0.2 MG/ML IJ SOLN: 0.2000 mg | INTRAMUSCULAR | Status: DC | PRN

## 2020-02-16 MED ORDER — DEXTROSE 5 % IV SOLN: INTRAVENOUS | Status: DC

## 2020-02-16 MED ORDER — MORPHINE SULFATE (PF) 2 MG/ML IV SOLN
2.0000 mg | INTRAVENOUS | Status: DC | PRN
  Administered 2020-02-16: 4 mg via INTRAVENOUS
  Administered 2020-02-16: 2 mg via INTRAVENOUS
  Administered 2020-02-16 (×2): 4 mg via INTRAVENOUS
  Filled 2020-02-16 (×2): qty 2
  Filled 2020-02-16: qty 1
  Filled 2020-02-16 (×2): qty 2

## 2020-02-16 MED ORDER — ACETAMINOPHEN 650 MG RE SUPP: 650.0000 mg | Freq: Four times a day (QID) | RECTAL | Status: DC | PRN

## 2020-02-16 MED ORDER — ACETAMINOPHEN 325 MG PO TABS: 650.0000 mg | ORAL_TABLET | Freq: Four times a day (QID) | ORAL | Status: DC | PRN

## 2020-02-17 LAB — CULTURE, BLOOD (ROUTINE X 2)
Culture: NO GROWTH
Culture: NO GROWTH
Special Requests: ADEQUATE
Special Requests: ADEQUATE

## 2020-02-22 NOTE — Progress Notes (Signed)
Pt salter HFNC weaned to 4L. Pt remains on comfort care at this time. RT will continue to monitor.

## 2020-02-22 NOTE — Progress Notes (Signed)
RT took pt off of Bipap and placed on HFNC [place]ter 15 Lpm to further transition pt to comfort per family wishes. Pt sats in low to mid 60's at this time. RT will continue to monitor.

## 2020-02-22 NOTE — Progress Notes (Signed)
eLink Physician-Brief Progress Note Patient Name: Leslie Alexander DOB: 19-Mar-1941 MRN: [id number]   Date of Service  2020-03-02  HPI/Events of Note  Spoke with daughter, Leslie Alexander, who voices that she and the family are ready to move to comfort care and allow her mother to pass with comfort and dignity.   eICU Interventions  Will proceed with comfort measures per withdrawal of life sustaining care order set.      Intervention Category Major Interventions: End of life / care limitation discussion  Lenell Antu 03-02-20, 2:44 AM

## 2020-02-22 NOTE — Progress Notes (Signed)
NAME:  Leslie Alexander, MRN:  [id number], DOB:  [birth year], LOS: 5 ADMISSION DATE:  01/25/2020, CONSULTATION DATE:  02/12/2020 REFERRING MD:  Hospitalist, CHIEF COMPLAINT:  Lactic acidosis   Brief History   79 yo F with a history of pulmonary hypertension, diastolic heart failure, non-obstructive CAD, OSA, HTN, persistent Afib, obesity, and stage 3 CKD who presented to [facility] 02/01/20 for shortness of breath and swelling, thought to be due to heart failure.  She was also being treated for a klebsiella UTI and possible pneumonia with ceftriaxone and azithromycin.   Patient was initially diuresed aggressively with IV lasix, but then developed hyponatremia, and diuresis was stopped and she was given normal saline.  She was also having issues with hyperkalemia over there and was given kayexalate.   History of present illness   Family wanted her transferred to [facility].  Over at [facility], she was having elevated LFTs, particularly the alk phos.  This evening, patient had an episode of hypoglycemia, which improved with dextrose.  Her labs returned with significant abnormalities including a significant lactic acidosis with lactate of 9, bicarb of 10, hyperkalemia, and INR of 5.  When seen at the bedside, patient is lethargic but overall but alert and oriented, satting 97% on 6L.  She feels unwell, but not in any pain except for mild diffuse abdominal pain, which she attributes to not having a bowel movement. Denies any chest pain and thinks his breathing is "alright."  Past Medical History   Past Medical History:  Diagnosis Date  . Atrial fibrillation ([place])   . CAD (coronary artery disease)    a. mild nonobstructive CAD by cath in 11/2014. b. repeat cath in 12/2018 showing minor CAD; noted to have moderate pulmonary HTN  . Chronic combined systolic (congestive) and diastolic (congestive) heart failure (HCC)    a. EF reduced to 40-45% by echo in 12/2018  . Depression   . Essential hypertension,  benign   . GERD (gastroesophageal reflux disease)   . History of breast cancer   . Hx of atrial fibrillation, no current medication    converted out of A. Fib with cardioversion  . Hypothyroidism   . Left bundle branch block    negative Lexiscan Myoview; EF 56%, 3/13   . Lumbar disc disease   . Meralgia paresthetica, right   . Mixed hyperlipidemia   . Morbid obesity ([place])   . Nonalcoholic fatty liver disease   . OSA (obstructive sleep apnea)    CPAP machine  . Pulmonary hypertension (HCC)    RVSP 55-60 mm mercury     Significant Hospital Events   3/21: Transferred from [facility]  3/22: Transferred to ICU for lactic acidosis  Consults:  Cardiology  Procedures:  3 lumen PICC 3/24 >>   Significant Diagnostic Tests:  RUQ doppler 3/9: patent portal vein, liver within normal limits  CT A/P w/o contrast: Nonobstructive left nephrolithiasis, fat stranding around pancreas (per [facility], this is chronic)  BNP: 3838 3/15 -->5965 3/21 Na: 3/15 125-->127-->120>123>125 (3/17)-->130 (3/18) --> 128 (3/20)--> 130 (3/21) Cr: 3/15 1.3-->1.58-->1.77-->1.56--> 0.9 --> 1.37 (3/20) -->1.63 (3/21)  ECHO 3/22 > 1. Left ventricular ejection fraction, by estimation, is 35 to 40%. The  left ventricle has severely decreased function. The left ventricle  demonstrates global hypokinesis. There is moderate left ventricular  hypertrophy. Left ventricular diastolic  function could not be evaluated. Left ventricular diastolic parameters are  indeterminate. There is the interventricular septum is flattened in  systole and diastole,  consistent with right ventricular pressure and  volume overload. Septal dyssynchrony is  significant.  2. Right ventricular systolic function is moderately reduced. The right  ventricular size is moderately enlarged. There is moderately elevated  pulmonary artery systolic pressure. The estimated right ventricular  systolic pressure is 16.1 mmHg.  3.  Left atrial size was moderately dilated.  4. Right atrial size was severely dilated.  5. The mitral valve is abnormal. Mild mitral valve regurgitation.  6. The aortic valve is abnormal. Aortic valve regurgitation is trivial.  Mild aortic valve sclerosis is present, with no evidence of aortic valve  stenosis.  7. The inferior vena cava is dilated in size with <50% respiratory  variability, suggesting right atrial pressure of 15 mmHg.   Renal ultrasound 3/22 > No hydronephrosis or shadowing stone.  Micro Data:  OSH Urine culture 3/11 > Klebsiella (pan-susceptible), 10,000 CFU  Blood cultures 3/11 > negative  COVID 3/21 > Negative  Blood cultures 3/22 >   Antimicrobials:  S/p Ceftriaxone and azithromycin at OSH Cefepime 3/23 >>    Interim history/subjective:  Required BiPAP all day yesterday with progressive increased work of breathing, develops respiratory distress in the evening hours 3/25.  Note discussions by Drs. Mariane Masters and Oletta Darter with family.  Decision made to transition the patient to a comfort based approach.  Dobutamine stopped, other drip stopped.  Transition to high flow nasal cannula and off BiPAP.  Morphine pushes initiated   Objective   Blood pressure (!) 151/71, pulse (!) 139, temperature 98.2 F (36.8 C), temperature source Axillary, resp. rate 14, height '5\' 5"'  (1.651 m), weight 112.3 kg, SpO2 (!) 84 %. CVP:  [16 mmHg-19 mmHg] 16 mmHg  FiO2 (%):  [60 %-80 %] 80 %   Intake/Output Summary (Last 24 hours) at 02-23-2020 0740 Last data filed at 2020/02/23 0600 Gross per 24 hour  Intake 1451.56 ml  Output 340 ml  Net 1111.56 ml   Filed Weights   02/13/20 0420 02/14/20 0428 02/15/20 0324  Weight: 116 kg 112 kg 112.3 kg    Examination: General: Agonal, critically ill but no evidence of respiratory distress HEENT: No upper airway noise or gasping Neuro: Obtunded CV: Irregular, tachycardic PULM: Decreased bilateral breath sounds, inspiratory crackles GI:  Nondistended Extremities: Trace lower extremity edema Skin: Ecchymoses, no rash  Resolved Hospital Problem list   Hypoglycemia  Assessment & Plan:  79 yo woman with a history of pulmonary hypertension, diastolic heart failure, non-obstructive CAD, OSA, HTN, persistent Afib, obesity, and stage 3 CKD transferred to the MICU for metabolic acidosis (namely lactic acidosis), liver injury, AKI on CKD, in the setting of cardiogenic shock.    Metabolic acidosis, lactic acidosis Placed on bipap to increase minute ventilation.  Suspect underlying cause could be cardiogenic shock causing a low-flow state, with prerenal AKI and hepatic congestion (patient is cool and wet) leading to liver dysfunction.   -Bicarb 13, lactate 9 >> 7 >> 2.7 Plan: No further labs planned  Cardiogenic shock secondary to biventricular heart failure  Signs of RV more than LV dysfunction on previous echo at [facility].   Plan: Abx completed for possible infectious component Dobutamine and lasix stopped    Acute liver injury improved Coagulopathy  Plan: No further labs  Acute hypoxic respiratory failure:  Pulmonary edema in the setting of cardiogenic shock Plan: Progressive resp failure last 24h, agree that chances for recovery even w MV were poor. Agree with transition to comfort care. Family is at bedside, pt receiving morphine. Will  down-titrate O2. Continue to support the family  AKI on CKD.  At some risk for progression given increased Lasix dose 3/24-5 Hyperkalemia, resolved Plan: All non-comfort meds stopped   Abdominal pain Constipation  Plan: All non-comfort meds stopped   Permanent A-fib, tachycardia being influenced by dobutamine, critical illness Plan: Amiodarone and heparin off  Parkinson's: Home medications include  levo-dopa, carbidopa Plan: All non-comfort meds stopped   Hypothyroidism:  Plan: All non-comfort meds stopped    Best practice:  Diet: NPO Pain/Anxiety/Delirium  protocol (if indicated): n/a VAP protocol (if indicated): n/a DVT prophylaxis:   GI prophylaxis: n/a Glucose control:  Mobility: when able Code Status: Full Family Communication: Visited with multiple family members at bedside 3/26 Disposition: 87M ICU  Labs   CBC: Recent Labs  Lab 02/10/2020 2335 02/21/2020 2335 02/12/20 0558 02/12/20 0819 02/13/20 0846 02/14/20 0600 02/15/20 0311  WBC 18.9*  --  17.6*  --  17.5* 16.7* 14.7*  NEUTROABS 16.1*  --   --   --   --   --   --   HGB 14.1   < > 12.9 12.6 12.1 12.3 12.2  HCT 45.3   < > 42.4 37.0 37.6 37.8 39.2  MCV 97.4  --  98.1  --  93.3 92.9 94.9  PLT 254  --  258  --  177 160 138*   < > = values in this interval not displayed.    Basic Metabolic Panel: Recent Labs  Lab 01/29/2020 2335 02/13/2020 2335 02/12/20 0558 02/12/20 0819 02/13/20 0846 02/13/20 1625 02/14/20 0600 02/14/20 2339 02/15/20 0311 02/15/20 0730 02/15/20 1140 02/15/20 1519 02/15/20 1927  NA 132*   < > 132*   < > 137   < > 138   < > 139 140 138 137 138  K 6.4*   < > 6.1*   < > 3.8   < > 3.6   < > 3.6 3.7 3.7 4.2 4.4  CL 100   < > 98   < > 102   < > 104   < > 101 100 100 99 99  CO2 10*   < > 13*   < > 20*   < > 23   < > '27 25 24 24 24  ' GLUCOSE 66*   < > 111*   < > 133*   < > 160*   < > 162* 217* 226* 275* 263*  BUN 52*   < > 58*   < > 66*   < > 54*   < > 39* 41* 41* 41* 42*  CREATININE 2.36*   < > 2.90*   < > 2.15*   < > 1.43*   < > 1.07* 1.14* 1.16* 1.30* 1.33*  CALCIUM 9.5   < > 9.2   < > 8.5*   < > 8.3*   < > 8.6* 8.4* 8.5* 8.6* 8.6*  MG 2.1  --  2.0  --  1.8  --  1.5*  --  1.6*  --   --   --   --   PHOS  --   --  6.9*  --  4.5  --  3.0  --   --   --   --   --   --    < > = values in this interval not displayed.   GFR: Estimated Creatinine Clearance: 44.9 mL/min (A) (by C-G formula based on SCr of 1.33 mg/dL (H)). Recent Labs  Lab 02/17/2020 2335 02/12/20 0251  02/12/20 0252 02/12/20 0457 02/12/20 0558 02/12/20 0832 02/13/20 0846 02/14/20 0600  02/15/20 0311  PROCALCITON  --   --  0.58  --   --   --   --   --   --   WBC   < >  --   --   --  17.6*  --  17.5* 16.7* 14.7*  LATICACIDVEN  --  8.9*  --  9.2* 7.0* 2.7*  --   --   --    < > = values in this interval not displayed.    Liver Function Tests: Recent Labs  Lab 02/12/20 0931 02/12/20 1715 02/13/20 0846 02/13/20 1625 02/14/20 0600  AST 278* 246* 305* 291* 190*  ALT 189* 169* 261* 103* 101*  ALKPHOS 543* 478* 505* 484* 496*  BILITOT 2.5* 1.6* 1.9* 1.7* 1.9*  PROT 5.9* 5.8* 6.3* 6.2* 6.4*  ALBUMIN 2.4* 2.4* 2.5* 2.5* 2.5*   Recent Labs  Lab 02/12/20 0531  LIPASE 35   No results for input(s): AMMONIA in the last 168 hours.  ABG    Component Value Date/Time   PHART 7.369 02/12/2020 0819   PCO2ART 25.0 (L) 02/12/2020 0819   PO2ART 47.0 (L) 02/12/2020 0819   HCO3 14.4 (L) 02/12/2020 0819   TCO2 15 (L) 02/12/2020 0819   ACIDBASEDEF 9.0 (H) 02/12/2020 0819   O2SAT 53.7 02/15/2020 1927     Coagulation Profile: Recent Labs  Lab 02/12/20 0558 02/12/20 1715 02/13/20 0846 02/14/20 0600 02/15/20 0311  INR 6.2* 3.8* 2.6* 1.8* 1.6*    Cardiac Enzymes: No results for input(s): CKTOTAL, CKMB, CKMBINDEX, TROPONINI in the last 168 hours.  HbA1C: Hgb A1c MFr Bld  Date/Time Value Ref Range Status  12/19/2014 06:45 AM 6.5 (H) <5.7 % Final    Comment:    (NOTE)                                                                       According to the ADA Clinical Practice Recommendations for 2011, when HbA1c is used as a screening test:  >=6.5%   Diagnostic of Diabetes Mellitus           (if abnormal result is confirmed) 5.7-6.4%   Increased risk of developing Diabetes Mellitus References:Diagnosis and Classification of Diabetes Mellitus,Diabetes SAYT,0160,10(XNATF 1):S62-S69 and Standards of Medical Care in         Diabetes - 2011,Diabetes TDDU,2025,42 (Suppl 1):S11-S61.     CBG: Recent Labs  Lab 02/13/20 1923 02/13/20 2333 02/14/20 0317 02/14/20 0721  02/15/20 1543  GLUCAP 153* 156* 161* 145* 180*     Critical care time: n/a     Baltazar Apo, MD, PhD 03/05/2020, 7:40 AM [facility] [phone] or if no answer [phone]

## 2020-02-22 NOTE — Progress Notes (Signed)
Pt is comfort care at this time. Family wishes to titrate BIPAP settings down gradually so that pt remains comfortable through the process. RN will titrate morphine dosing to help maintain pts level of comfort. RT will continue to monitor.

## 2020-02-22 NOTE — Progress Notes (Signed)
CDS notified of death. Suitable for Tissue Donation CDS# [phone]

## 2020-02-22 NOTE — Progress Notes (Signed)
Nutrition Brief Note  Chart reviewed. Pt now transitioning to comfort care.  No further nutrition interventions warranted at this time.  Please re-consult as needed.   Ithzel Fedorchak W, RD, LDN, CDCES Registered Dietitian II Certified Diabetes Care and Education Specialist Please refer to AMION for RD and/or RD on-call/weekend/after hours pager  

## 2020-02-22 DEATH — deceased

## 2020-02-23 LAB — PROTIME-INR
INR: 6.2 (ref 0.8–1.2)
Prothrombin Time: 54.9 seconds — ABNORMAL HIGH (ref 11.4–15.2)

## 2020-03-03 ENCOUNTER — Other Ambulatory Visit: Payer: Self-pay | Admitting: Cardiovascular Disease

## 2020-03-12 DIAGNOSIS — R570 Cardiogenic shock: Secondary | ICD-10-CM | POA: Diagnosis present

## 2020-03-12 DIAGNOSIS — E872 Acidosis, unspecified: Secondary | ICD-10-CM | POA: Diagnosis present

## 2020-03-12 DIAGNOSIS — E871 Hypo-osmolality and hyponatremia: Secondary | ICD-10-CM | POA: Diagnosis present

## 2020-03-12 DIAGNOSIS — J9600 Acute respiratory failure, unspecified whether with hypoxia or hypercapnia: Secondary | ICD-10-CM | POA: Diagnosis present

## 2020-03-12 DIAGNOSIS — E875 Hyperkalemia: Secondary | ICD-10-CM | POA: Diagnosis present

## 2020-03-23 NOTE — Death Summary Note (Signed)
DEATH SUMMARY   Patient Details  Name: Leslie Alexander MRN: [id number] DOB: [birth year]  Admission/Discharge Information   Admit Date:  02-12-20  Date of Death: Date of Death: 02/17/20  Time of Death: Time of Death: 0826  Length of Stay: 5  Referring Physician: Richardean Chimera, MD   Reason(s) for Hospitalization  Cardiogenic shock and lactic acidosis  Diagnoses  Preliminary cause of death:   Cardiogenic shock Secondary Diagnoses (including complications and co-morbidities):  Principal Problem:   Acute on chronic combined systolic and diastolic CHF (congestive heart failure) (HCC) Active Problems:   PAF (paroxysmal atrial fibrillation) (HCC)   Acute CHF (congestive heart failure) (HCC)   ARF (acute renal failure) (HCC)   Elevated LFTs   Pressure injury of skin   Cardiogenic shock (HCC)   Lactic acidosis   Acute respiratory failure [facility])   Brief Hospital Course (including significant findings, care, treatment, and services provided and events leading to death)  Leslie Alexander was a 79 y.o. year old female with a history of pulmonary hypertension, diastolic heart failure, non-obstructive CAD, OSA, HTN, persistent Afib, obesity, and stage 3 CKD.  Admitted to outside hospital with dyspnea and edema, pulmonary infiltrates.  She was aggressively diuresed for presumed acute on chronic CHF.  She was also treated for possible Klebsiella UTI.  She developed a profound lactic acidosis and shock consistent with cardiogenic shock.  She also evolved acute on chronic (stage III) renal failure due to poor cardiac output and diuresis.  She required BiPAP support due to her pulmonary infiltrates and her lactic acidosis.  She required pressors, dobutamine support.  Despite all aggressive care her respiratory status, blood pressure did not improve.  Decision was made to transition to comfort care.  She died on 02-17-20.   Pertinent Labs and Studies  Significant Diagnostic Studies DG Abd 1  View  Result Date: 02/12/2020 CLINICAL DATA:  Constipation EXAM: ABDOMEN - 1 VIEW COMPARISON:  None. FINDINGS: There is air-filled dilated loops of bowel measuring up to 7 cm in transverse dimension, which could represent the transverse colon. Air is seen down to the level the rectum. A small amount of retained contrast seen within the stomach and proximal small bowel. IMPRESSION: Findings suggestive of partial bowel obstruction versus ileus. Electronically Signed   By: Jonna Clark M.D.   On: 02/12/2020 06:27   US RENAL  Result Date: 02/12/2020 CLINICAL DATA:  79 year old female with acute renal insufficiency. EXAM: RENAL / URINARY TRACT ULTRASOUND COMPLETE COMPARISON:  Abdominal ultrasound dated 02/05/2020. FINDINGS: Evaluation is limited due to patient's body habitus. Right Kidney: Renal measurements: 9.3 x 5.0 x 4.4 cm = volume: 107 mL. Normal echogenicity. No hydronephrosis or shadowing stone. Left Kidney: Renal measurements: 8.7 x 5.6 x 4.6 cm = volume: 118 mL. Normal echogenicity. No hydronephrosis or shadowing stone. The stone seen in the inferior pole of the left kidney on the prior ultrasound is not well visualized on today's exam. Bladder: The urinary bladder is predominantly decompressed around a Foley catheter. Other: None. IMPRESSION: No hydronephrosis or shadowing stone. Electronically Signed   By: Elgie Collard M.D.   On: 02/12/2020 19:24   DG Chest Port 1 View  Result Date: 02/15/2020 CLINICAL DATA:  Respiratory failure.  Hypoxia. EXAM: PORTABLE CHEST 1 VIEW COMPARISON:  02/14/2020.  01/16/2019. FINDINGS: Right PICC line in stable position. Mediastinum is stable. Stable cardiomegaly. Diffuse severe bilateral interstitial prominence again noted without interim change. No pleural effusion or pneumothorax. Surgical clips left chest. IMPRESSION: 1.  Right PICC line stable position. 2.  Stable cardiomegaly. 3. Diffuse severe bilateral interstitial prominence again noted consistent  interstitial edema and/or pneumonitis. No interim change. Electronically Signed   By: Marcello Moores  Register   On: 02/15/2020 05:42   DG Chest Port 1 View  Result Date: 02/14/2020 CLINICAL DATA:  Status post PICC. EXAM: PORTABLE CHEST 1 VIEW COMPARISON:  Chest radiograph 02/12/2020, most recent CT 12/19/2014. FINDINGS: Right upper extremity PICC tip projects over the mid SVC. No pneumothorax. Cardiomegaly is unchanged. Aortic atherosclerosis. Worsening reticular and patchy opacities throughout both lungs. No large pleural effusion. No pneumothorax. Surgical clips in the left axilla. No acute osseous abnormalities are seen. IMPRESSION: 1. Tip of the right upper extremity PICC projects over the mid SVC. 2. Worsening reticular and patchy opacities throughout both lungs which may be infectious or pulmonary edema. Cardiomegaly is stable. Aortic Atherosclerosis (ICD10-I70.0). Electronically Signed   By: Keith Rake M.D.   On: 02/14/2020 17:33   DG CHEST PORT 1 VIEW  Result Date: 02/12/2020 CLINICAL DATA:  Shortness of breath EXAM: PORTABLE CHEST 1 VIEW COMPARISON:  02/10/2020 FINDINGS: Cardiomegaly. Bilateral airspace disease, slightly improved since prior study. No effusions. Right PICC line is unchanged. IMPRESSION: Cardiomegaly, diffuse bilateral airspace disease with slight improvement since prior study. This could reflect edema or infection. Electronically Signed   By: Rolm Baptise M.D.   On: 02/12/2020 03:51   ECHOCARDIOGRAM COMPLETE BUBBLE STUDY  Result Date: 02/12/2020    ECHOCARDIOGRAM REPORT   Patient Name:   Leslie Alexander Date of Exam: 02/12/2020 Medical Rec #:  [id number]         Height:       65.0 in Accession #:    [id number]        Weight:       257.7 lb Date of Birth:  1941/01/16         BSA:          2.203 m Patient Age:    40 years          BP:           133/64 mmHg Patient Gender: F                 HR:           88 bpm. Exam Location:  Inpatient Procedure: 2D Echo Indications:     cardiogenic shock  History:        Patient has prior history of Echocardiogram examinations, most                 recent 01/12/2019. CHF, CAD, Arrythmias:Atrial Fibrillation; Risk                 Factors:Dyslipidemia and Former Smoker. Pulmonary hypertension.  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 restricted mobility IMPRESSIONS  1. Left ventricular ejection fraction, by estimation, is 35 to 40%. The left ventricle has severely decreased function. The left ventricle demonstrates global hypokinesis. There is moderate left ventricular hypertrophy. Left ventricular diastolic function could not be evaluated. Left ventricular diastolic parameters are indeterminate. There is the interventricular septum is flattened in systole and diastole, consistent with right ventricular pressure and volume overload. Septal dyssynchrony is significant.  2. Right ventricular systolic function is moderately reduced. The right ventricular size is moderately enlarged. There is moderately elevated pulmonary artery systolic pressure.  The estimated right ventricular systolic pressure is 49.1 mmHg.  3. Left atrial size was moderately dilated.  4. Right atrial size was severely dilated.  5. The mitral valve is abnormal. Mild mitral valve regurgitation.  6. The aortic valve is abnormal. Aortic valve regurgitation is trivial. Mild aortic valve sclerosis is present, with no evidence of aortic valve stenosis.  7. The inferior vena cava is dilated in size with <50% respiratory variability, suggesting right atrial pressure of 15 mmHg. Comparison(s): 01/12/19: LVEF 40-45%. Conclusion(s)/Recommendation(s): Study reviewed with Dr. Jacques Navy, she is in agreement. FINDINGS  Left Ventricle: Left ventricular ejection fraction, by estimation, is 35 to 40%. The left ventricle has moderately  decreased function. The left ventricle demonstrates global hypokinesis. The left ventricular internal cavity size was normal in size. There is moderate left ventricular hypertrophy. The interventricular septum is flattened in systole and diastole, consistent with right ventricular pressure and volume overload. Left ventricular diastolic function could not be evaluated due to atrial fibrillation. Left ventricular diastolic parameters are indeterminate. Right Ventricle: The right ventricular size is moderately enlarged. No increase in right ventricular wall thickness. Right ventricular systolic function is moderately reduced. There is moderately elevated pulmonary artery systolic pressure. The tricuspid  regurgitant velocity is 2.92 m/s, and with an assumed right atrial pressure of 15 mmHg, the estimated right ventricular systolic pressure is 49.1 mmHg. Left Atrium: Left atrial size was moderately dilated. Right Atrium: Right atrial size was severely dilated. Pericardium: Trivial pericardial effusion is present. The pericardial effusion is circumferential. Mitral Valve: The mitral valve is abnormal. Mild mitral valve regurgitation. Tricuspid Valve: The tricuspid valve is grossly normal. Tricuspid valve regurgitation is mild. Aortic Valve: The aortic valve is abnormal. Aortic valve regurgitation is trivial. Mild aortic valve sclerosis is present, with no evidence of aortic valve stenosis. Pulmonic Valve: The pulmonic valve was grossly normal. Pulmonic valve regurgitation is trivial. Aorta: The aortic root was not well visualized. Venous: The inferior vena cava is dilated in size with less than 50% respiratory variability, suggesting right atrial pressure of 15 mmHg. IAS/Shunts: The interatrial septum was not well visualized.  LEFT VENTRICLE PLAX 2D LVIDd:         4.80 cm LVIDs:         3.15 cm LV PW:         1.40 cm LV IVS:        0.90 cm LVOT diam:     1.90 cm LV SV:         36 LV SV Index:   16 LVOT Area:     2.84  cm  LEFT ATRIUM           Index       RIGHT ATRIUM           Index LA diam:      4.90 cm 2.22 cm/m  RA Area:     22.30 cm LA Vol (A4C): 32.6 ml 14.80 ml/m RA Volume:   57.20 ml  25.96 ml/m  AORTIC VALVE LVOT Vmax:   67.20 cm/s LVOT Vmean:  53.700 cm/s LVOT VTI:    0.128 m  AORTA Ao Root diam: 3.10 cm MITRAL VALVE               TRICUSPID VALVE MV Area (PHT): 1.89 cm    TR Peak grad:   34.1 mmHg MV Decel Time: 401 msec    TR Vmax:        292.00 cm/s MV E velocity: 81.80 cm/s  SHUNTS                            Systemic VTI:  0.13 m                            Systemic Diam: 1.90 cm Zoila Shutter MD Electronically signed by Zoila Shutter MD Signature Date/Time: 02/12/2020/3:03:26 PM    Final    VAS [place] UPPER EXTREMITY VENOUS DUPLEX  Result Date: 02/13/2020 UPPER VENOUS STUDY  Indications: Swelling, and bruising Limitations: Body habitus, bandages, line and poor ultrasound/tissue interface. Comparison Study: No prior exam. Performing Technologist: Kennedy Bucker ARDMS, RVT  Examination Guidelines: A complete evaluation includes B-mode imaging, spectral Doppler, color Doppler, and power Doppler as needed of all accessible portions of each vessel. Bilateral testing is considered an integral part of a complete examination. Limited examinations for reoccurring indications may be performed as noted.  Right Findings: +----------+------------+---------+-----------+----------+---------------------+ RIGHT     CompressiblePhasicitySpontaneousProperties       Summary        +----------+------------+---------+-----------+----------+---------------------+ IJV           Full       Yes       Yes                                    +----------+------------+---------+-----------+----------+---------------------+ Subclavian    Full       Yes       Yes               IV line visualized   +----------+------------+---------+-----------+----------+---------------------+ Axillary       Full       Yes       Yes               IV line visualized   +----------+------------+---------+-----------+----------+---------------------+ Brachial      Full       Yes       Yes               IV line visualized   +----------+------------+---------+-----------+----------+---------------------+ Radial        Full                                  visualized with color +----------+------------+---------+-----------+----------+---------------------+ Ulnar         Full                                  visualized with color +----------+------------+---------+-----------+----------+---------------------+ Cephalic      Full                                                        +----------+------------+---------+-----------+----------+---------------------+ Basilic                                                Not visualized     +----------+------------+---------+-----------+----------+---------------------+  Summary:  Right: No evidence of deep vein thrombosis in the  upper extremity. No evidence of superficial vein thrombosis in the upper extremity. Basilic vein not visualized. Limited visualization of radial and ulnar veins due to small caliber.  *See table(s) above for measurements and observations.  Diagnosing physician: Waverly Ferrari MD Electronically signed by Waverly Ferrari MD on 02/13/2020 at 7:19:52 PM.    Final    [place] EKG SITE RITE  Result Date: 02/14/2020 If Site Rite image not attached, placement could not be confirmed due to current cardiac rhythm.   Les Pou Jericho Cieslik 03/12/2020, 3:22 PM

## 2020-09-07 IMAGING — DX DG CHEST 2V
2 series · 2 of 2 positions shown · non-contrast
Comparison: Chest x-rays dated 01/05/2019 and 08/17/2018.

CLINICAL DATA: Chest tightness and shortness of breath today.
History of CHF.

EXAM:
CHEST - 2 VIEW

[chest lat]
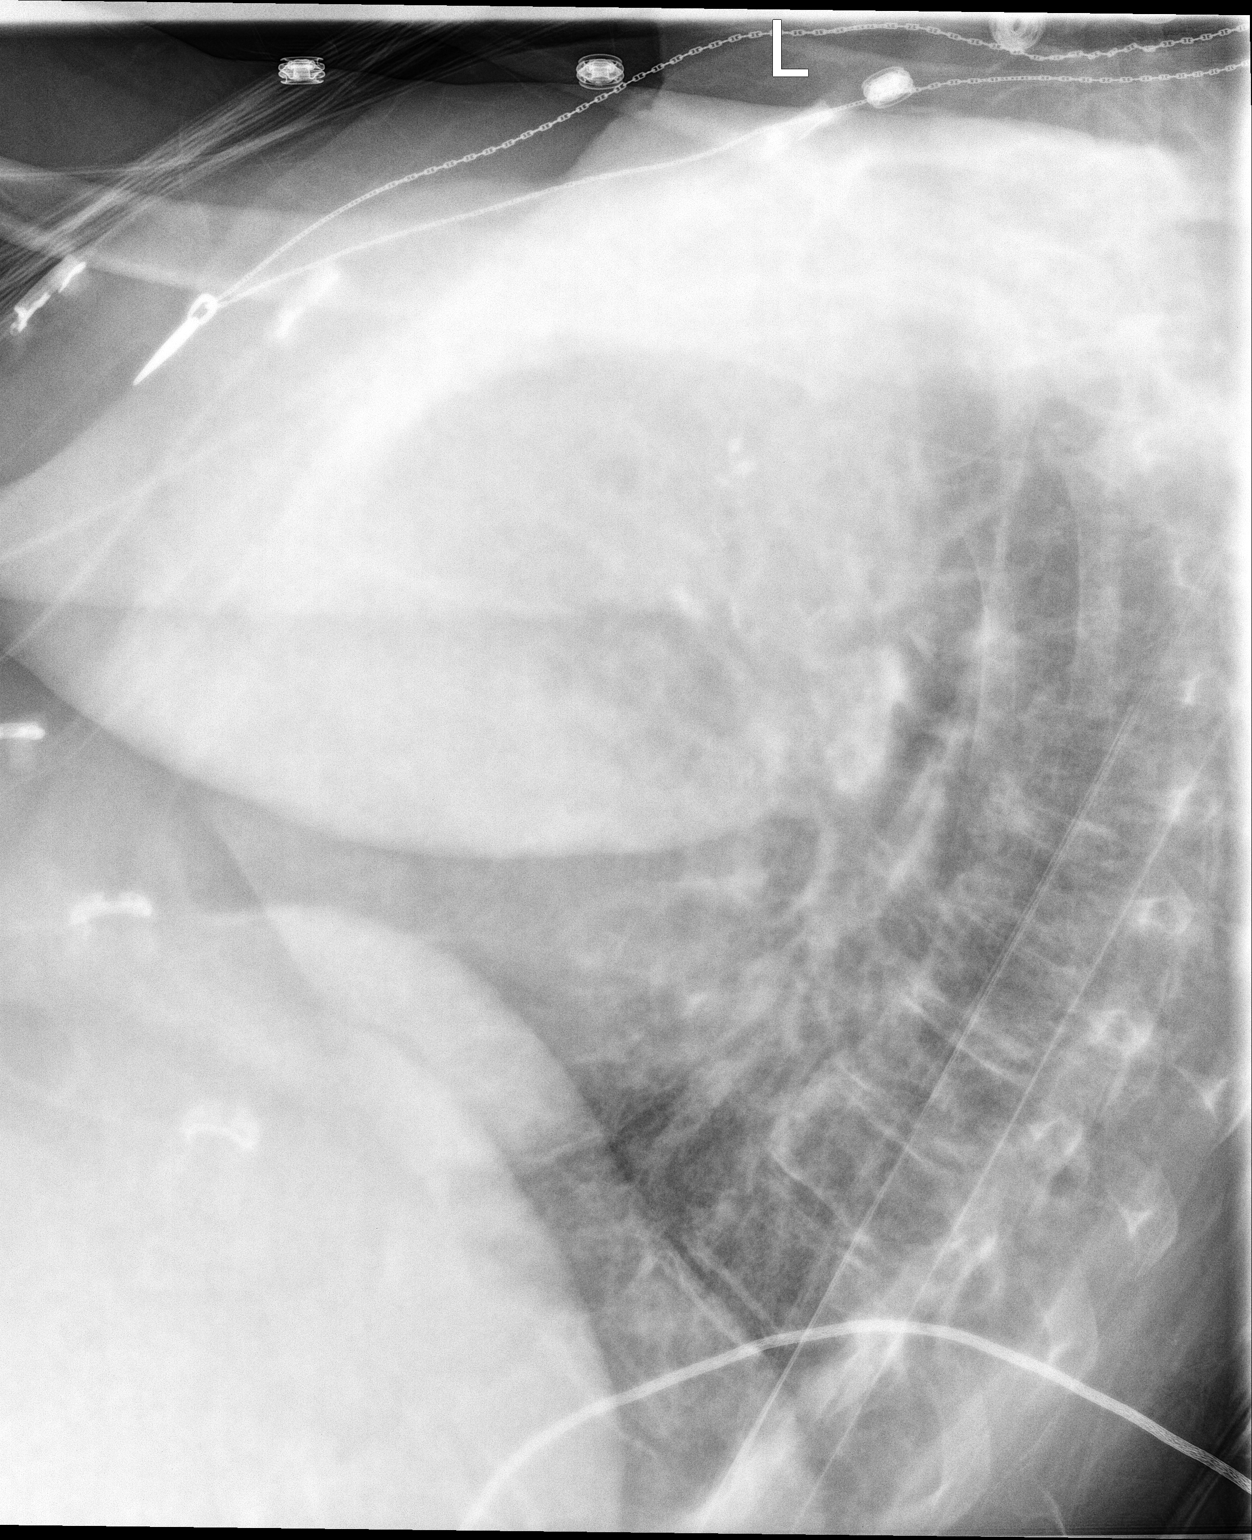

[chest ap]
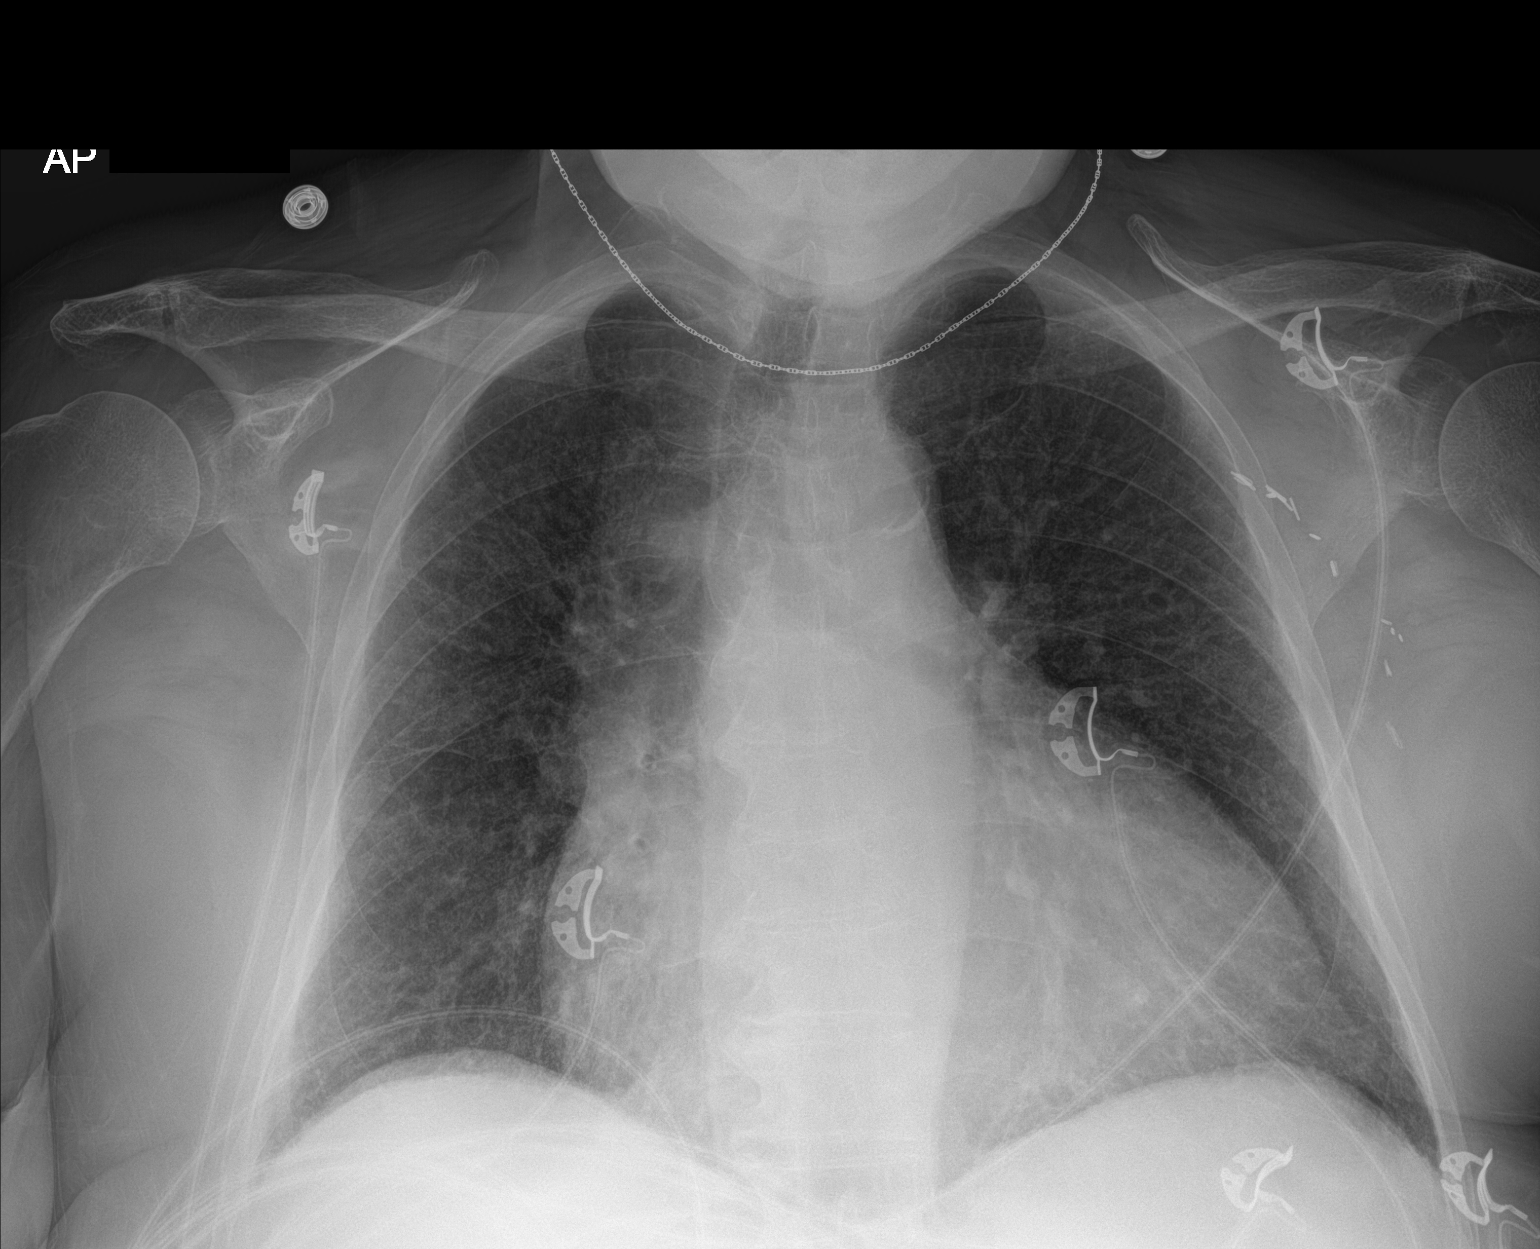

[2 of 2 positions shown; findings below may reference images not displayed]

FINDINGS: Stable cardiomegaly. Coarse interstitial markings bilaterally, not
significantly changed compared to multiple prior studies. No
evidence of alveolar pulmonary edema. No confluent opacity to
suggest a developing pneumonia. No pleural effusion or pneumothorax
seen. Osseous structures about the chest are unremarkable.
IMPRESSION: 1. No acute findings. No evidence of pneumonia or alveolar pulmonary
edema.
2. Stable cardiomegaly.
3. Coarse interstitial markings, stable compared to previous exams,
most likely chronic mild interstitial edema.
